# Patient Record
Sex: Female | Born: 1950 | Race: Black or African American | Hispanic: No | Marital: Single | Smoking: Never smoker
Health system: Southern US, Community
[De-identification: ages and names within clinical notes are randomized; demographics above are authoritative.]

## PROBLEM LIST (undated history)

## (undated) DIAGNOSIS — I10 Essential (primary) hypertension: Secondary | ICD-10-CM

## (undated) DIAGNOSIS — K5909 Other constipation: Secondary | ICD-10-CM

## (undated) DIAGNOSIS — E119 Type 2 diabetes mellitus without complications: Secondary | ICD-10-CM

## (undated) DIAGNOSIS — S7290XA Unspecified fracture of unspecified femur, initial encounter for closed fracture: Secondary | ICD-10-CM

## (undated) DIAGNOSIS — N3281 Overactive bladder: Secondary | ICD-10-CM

## (undated) DIAGNOSIS — R519 Headache, unspecified: Secondary | ICD-10-CM

## (undated) DIAGNOSIS — R55 Syncope and collapse: Secondary | ICD-10-CM

## (undated) DIAGNOSIS — I209 Angina pectoris, unspecified: Secondary | ICD-10-CM

## (undated) DIAGNOSIS — I2510 Atherosclerotic heart disease of native coronary artery without angina pectoris: Secondary | ICD-10-CM

## (undated) DIAGNOSIS — C8590 Non-Hodgkin lymphoma, unspecified, unspecified site: Secondary | ICD-10-CM

## (undated) DIAGNOSIS — M6530 Trigger finger, unspecified finger: Secondary | ICD-10-CM

## (undated) DIAGNOSIS — I442 Atrioventricular block, complete: Secondary | ICD-10-CM

## (undated) DIAGNOSIS — N951 Menopausal and female climacteric states: Secondary | ICD-10-CM

## (undated) DIAGNOSIS — M7710 Lateral epicondylitis, unspecified elbow: Secondary | ICD-10-CM

## (undated) DIAGNOSIS — R51 Headache: Secondary | ICD-10-CM

## (undated) DIAGNOSIS — Z79890 Hormone replacement therapy (postmenopausal): Secondary | ICD-10-CM

## (undated) DIAGNOSIS — I739 Peripheral vascular disease, unspecified: Secondary | ICD-10-CM

## (undated) DIAGNOSIS — D219 Benign neoplasm of connective and other soft tissue, unspecified: Secondary | ICD-10-CM

## (undated) DIAGNOSIS — M719 Bursopathy, unspecified: Secondary | ICD-10-CM

## (undated) HISTORY — PX: DILATION AND CURETTAGE OF UTERUS: SHX78

## (undated) HISTORY — DX: Atrioventricular block, complete: I44.2

## (undated) HISTORY — DX: Atherosclerotic heart disease of native coronary artery without angina pectoris: I25.10

## (undated) HISTORY — DX: Unspecified fracture of unspecified femur, initial encounter for closed fracture: S72.90XA

## (undated) HISTORY — DX: Overactive bladder: N32.81

## (undated) HISTORY — DX: Type 2 diabetes mellitus without complications: E11.9

## (undated) HISTORY — DX: Syncope and collapse: R55

## (undated) HISTORY — DX: Benign neoplasm of connective and other soft tissue, unspecified: D21.9

## (undated) HISTORY — DX: Headache, unspecified: R51.9

## (undated) HISTORY — DX: Headache: R51

## (undated) HISTORY — DX: Essential (primary) hypertension: I10

## (undated) HISTORY — DX: Trigger finger, unspecified finger: M65.30

## (undated) HISTORY — PX: PELVIC LAPAROSCOPY: SHX162

## (undated) HISTORY — DX: Hormone replacement therapy: Z79.890

## (undated) HISTORY — DX: Bursopathy, unspecified: M71.9

## (undated) HISTORY — PX: TOE SURGERY: SHX1073

## (undated) HISTORY — DX: Peripheral vascular disease, unspecified: I73.9

## (undated) HISTORY — DX: Other constipation: K59.09

## (undated) HISTORY — DX: Menopausal and female climacteric states: N95.1

## (undated) HISTORY — DX: Non-Hodgkin lymphoma, unspecified, unspecified site: C85.90

## (undated) HISTORY — DX: Lateral epicondylitis, unspecified elbow: M77.10

---

## 1990-03-05 DIAGNOSIS — C8590 Non-Hodgkin lymphoma, unspecified, unspecified site: Secondary | ICD-10-CM

## 1990-03-05 HISTORY — DX: Non-Hodgkin lymphoma, unspecified, unspecified site: C85.90

## 1990-03-05 HISTORY — PX: OTHER SURGICAL HISTORY: SHX169

## 2006-11-26 ENCOUNTER — Encounter: Admission: RE | Admit: 2006-11-26 | Discharge: 2006-11-26 | Payer: Self-pay | Admitting: Internal Medicine

## 2007-11-27 ENCOUNTER — Encounter: Admission: RE | Admit: 2007-11-27 | Discharge: 2007-11-27 | Payer: Self-pay | Admitting: Internal Medicine

## 2008-12-03 ENCOUNTER — Encounter: Admission: RE | Admit: 2008-12-03 | Discharge: 2008-12-03 | Payer: Self-pay | Admitting: Internal Medicine

## 2009-03-05 DIAGNOSIS — S7290XA Unspecified fracture of unspecified femur, initial encounter for closed fracture: Secondary | ICD-10-CM

## 2009-03-05 HISTORY — DX: Unspecified fracture of unspecified femur, initial encounter for closed fracture: S72.90XA

## 2009-05-13 ENCOUNTER — Encounter: Admission: RE | Admit: 2009-05-13 | Discharge: 2009-05-13 | Payer: Self-pay | Admitting: Internal Medicine

## 2009-06-03 ENCOUNTER — Encounter: Admission: RE | Admit: 2009-06-03 | Discharge: 2009-06-03 | Payer: Self-pay | Admitting: Orthopedic Surgery

## 2009-06-08 ENCOUNTER — Ambulatory Visit (HOSPITAL_COMMUNITY): Admission: RE | Admit: 2009-06-08 | Discharge: 2009-06-08 | Payer: Self-pay | Admitting: Orthopedic Surgery

## 2009-06-22 ENCOUNTER — Ambulatory Visit: Payer: Self-pay | Admitting: Oncology

## 2009-06-29 ENCOUNTER — Ambulatory Visit: Payer: Self-pay | Admitting: Obstetrics and Gynecology

## 2009-06-29 ENCOUNTER — Other Ambulatory Visit: Admission: RE | Admit: 2009-06-29 | Discharge: 2009-06-29 | Payer: Self-pay | Admitting: Obstetrics and Gynecology

## 2009-07-08 ENCOUNTER — Ambulatory Visit: Payer: Self-pay | Admitting: Obstetrics and Gynecology

## 2009-07-22 ENCOUNTER — Ambulatory Visit: Payer: Self-pay | Admitting: Oncology

## 2009-07-22 LAB — COMPREHENSIVE METABOLIC PANEL
Alkaline Phosphatase: 49 U/L (ref 39–117)
BUN: 18 mg/dL (ref 6–23)
Creatinine, Ser: 0.9 mg/dL (ref 0.40–1.20)
Glucose, Bld: 90 mg/dL (ref 70–99)
Sodium: 137 mEq/L (ref 135–145)
Total Bilirubin: 0.5 mg/dL (ref 0.3–1.2)

## 2009-07-22 LAB — CBC WITH DIFFERENTIAL/PLATELET
Eosinophils Absolute: 0.1 10*3/uL (ref 0.0–0.5)
LYMPH%: 52.6 % — ABNORMAL HIGH (ref 14.0–49.7)
MCV: 95.3 fL (ref 79.5–101.0)
MONO%: 13 % (ref 0.0–14.0)
NEUT#: 1.5 10*3/uL (ref 1.5–6.5)
Platelets: 163 10*3/uL (ref 145–400)
RBC: 3.95 10*6/uL (ref 3.70–5.45)

## 2009-07-22 LAB — SEDIMENTATION RATE: Sed Rate: 8 mm/hr (ref 0–22)

## 2009-09-22 ENCOUNTER — Encounter: Admission: RE | Admit: 2009-09-22 | Discharge: 2009-09-22 | Payer: Self-pay | Admitting: Orthopedic Surgery

## 2009-12-16 ENCOUNTER — Encounter: Admission: RE | Admit: 2009-12-16 | Discharge: 2009-12-16 | Payer: Self-pay | Admitting: Internal Medicine

## 2010-03-09 ENCOUNTER — Encounter: Admit: 2010-03-09 | Payer: Self-pay | Admitting: Internal Medicine

## 2010-05-09 ENCOUNTER — Ambulatory Visit: Payer: Self-pay | Admitting: *Deleted

## 2010-05-10 ENCOUNTER — Ambulatory Visit (INDEPENDENT_AMBULATORY_CARE_PROVIDER_SITE_OTHER): Payer: BC Managed Care – PPO | Admitting: Obstetrics and Gynecology

## 2010-05-10 DIAGNOSIS — N3941 Urge incontinence: Secondary | ICD-10-CM

## 2010-05-10 DIAGNOSIS — R350 Frequency of micturition: Secondary | ICD-10-CM

## 2010-05-10 DIAGNOSIS — N952 Postmenopausal atrophic vaginitis: Secondary | ICD-10-CM

## 2010-05-12 ENCOUNTER — Ambulatory Visit: Payer: Self-pay | Admitting: *Deleted

## 2010-05-12 ENCOUNTER — Encounter: Payer: BC Managed Care – PPO | Attending: Internal Medicine | Admitting: *Deleted

## 2010-05-12 DIAGNOSIS — R7309 Other abnormal glucose: Secondary | ICD-10-CM | POA: Insufficient documentation

## 2010-05-12 DIAGNOSIS — E7800 Pure hypercholesterolemia, unspecified: Secondary | ICD-10-CM | POA: Insufficient documentation

## 2010-05-12 DIAGNOSIS — Z713 Dietary counseling and surveillance: Secondary | ICD-10-CM | POA: Insufficient documentation

## 2010-06-18 ENCOUNTER — Emergency Department (HOSPITAL_COMMUNITY)
Admission: EM | Admit: 2010-06-18 | Discharge: 2010-06-18 | Disposition: A | Discharge status: Home / Self Care | Payer: BC Managed Care – PPO | Attending: Emergency Medicine | Admitting: Emergency Medicine

## 2010-06-18 DIAGNOSIS — Z760 Encounter for issue of repeat prescription: Secondary | ICD-10-CM | POA: Insufficient documentation

## 2010-06-18 DIAGNOSIS — Z79899 Other long term (current) drug therapy: Secondary | ICD-10-CM | POA: Insufficient documentation

## 2010-06-18 DIAGNOSIS — J3489 Other specified disorders of nose and nasal sinuses: Secondary | ICD-10-CM | POA: Insufficient documentation

## 2010-06-18 DIAGNOSIS — R110 Nausea: Secondary | ICD-10-CM | POA: Insufficient documentation

## 2010-06-18 DIAGNOSIS — H8110 Benign paroxysmal vertigo, unspecified ear: Secondary | ICD-10-CM | POA: Insufficient documentation

## 2010-06-18 LAB — BASIC METABOLIC PANEL
CO2: 26 mEq/L (ref 19–32)
GFR calc Af Amer: >60 mL/min (ref >60)
GFR calc non Af Amer: >60 mL/min (ref >60)
Glucose, Bld: 111 mg/dL — ABNORMAL HIGH (ref 70–99)
Potassium: 3.5 mEq/L (ref 3.5–5.1)
Sodium: 138 mEq/L (ref 135–145)

## 2010-06-18 LAB — CBC
HCT: 37.8 % (ref 36.0–46.0)
Hemoglobin: 13 g/dL (ref 12.0–15.0)
MCH: 31.7 pg (ref 26.0–34.0)
MCHC: 34.4 g/dL (ref 30.0–36.0)
MCV: 92.2 fL (ref 78.0–100.0)
Platelets: 167 K/uL (ref 150–400)
RBC: 4.1 MIL/uL (ref 3.87–5.11)
RDW: 12.8 % (ref 11.5–15.5)
WBC: 3.7 10*3/uL — ABNORMAL LOW (ref 4.0–10.5)

## 2010-06-18 LAB — BASIC METABOLIC PANEL WITH GFR
BUN: 12 mg/dL (ref 6–23)
Calcium: 10 mg/dL (ref 8.4–10.5)
Chloride: 105 meq/L (ref 96–112)
Creatinine, Ser: 0.8 mg/dL (ref 0.4–1.2)

## 2010-06-18 LAB — URINALYSIS, ROUTINE W REFLEX MICROSCOPIC
Bilirubin Urine: NEGATIVE
Glucose, UA: NEGATIVE mg/dL
Hgb urine dipstick: NEGATIVE
Ketones, ur: NEGATIVE mg/dL
Nitrite: NEGATIVE
Protein, ur: NEGATIVE mg/dL
Specific Gravity, Urine: 1.007 (ref 1.005–1.030)
Urobilinogen, UA: 0.2 mg/dL (ref 0.0–1.0)
pH: 7.5 (ref 5.0–8.0)

## 2010-06-18 LAB — DIFFERENTIAL
Basophils Absolute: 0 K/uL (ref 0.0–0.1)
Basophils Relative: 0 % (ref 0–1)
Eosinophils Absolute: 0 K/uL (ref 0.0–0.7)
Eosinophils Relative: 1 % (ref 0–5)
Lymphocytes Relative: 56 % — ABNORMAL HIGH (ref 12–46)
Lymphs Abs: 2.1 10*3/uL (ref 0.7–4.0)
Monocytes Absolute: 0.4 K/uL (ref 0.1–1.0)
Monocytes Relative: 10 % (ref 3–12)
Neutro Abs: 1.2 10*3/uL — ABNORMAL LOW (ref 1.7–7.7)
Neutrophils Relative %: 33 % — ABNORMAL LOW (ref 43–77)

## 2010-07-06 ENCOUNTER — Encounter (INDEPENDENT_AMBULATORY_CARE_PROVIDER_SITE_OTHER): Payer: BC Managed Care – PPO | Admitting: Obstetrics and Gynecology

## 2010-07-06 ENCOUNTER — Other Ambulatory Visit: Payer: Self-pay | Admitting: Obstetrics and Gynecology

## 2010-07-06 ENCOUNTER — Other Ambulatory Visit (HOSPITAL_COMMUNITY)
Admission: RE | Admit: 2010-07-06 | Discharge: 2010-07-06 | Disposition: A | Discharge status: Home / Self Care | Payer: BC Managed Care – PPO | Source: Ambulatory Visit | Attending: Obstetrics and Gynecology | Admitting: Obstetrics and Gynecology

## 2010-07-06 DIAGNOSIS — Z833 Family history of diabetes mellitus: Secondary | ICD-10-CM

## 2010-07-06 DIAGNOSIS — Z01419 Encounter for gynecological examination (general) (routine) without abnormal findings: Secondary | ICD-10-CM

## 2010-07-06 DIAGNOSIS — Z13220 Encounter for screening for lipoid disorders: Secondary | ICD-10-CM

## 2010-07-06 DIAGNOSIS — Z124 Encounter for screening for malignant neoplasm of cervix: Secondary | ICD-10-CM | POA: Insufficient documentation

## 2010-07-10 ENCOUNTER — Ambulatory Visit: Payer: BC Managed Care – PPO | Admitting: Obstetrics and Gynecology

## 2010-07-10 ENCOUNTER — Other Ambulatory Visit: Payer: BC Managed Care – PPO

## 2010-08-02 ENCOUNTER — Other Ambulatory Visit: Payer: BC Managed Care – PPO

## 2010-08-02 ENCOUNTER — Ambulatory Visit (INDEPENDENT_AMBULATORY_CARE_PROVIDER_SITE_OTHER): Payer: BC Managed Care – PPO | Admitting: Obstetrics and Gynecology

## 2010-08-02 DIAGNOSIS — N83339 Acquired atrophy of ovary and fallopian tube, unspecified side: Secondary | ICD-10-CM

## 2010-08-02 DIAGNOSIS — D252 Subserosal leiomyoma of uterus: Secondary | ICD-10-CM

## 2010-08-02 DIAGNOSIS — D259 Leiomyoma of uterus, unspecified: Secondary | ICD-10-CM

## 2010-08-02 DIAGNOSIS — D251 Intramural leiomyoma of uterus: Secondary | ICD-10-CM

## 2010-11-17 ENCOUNTER — Other Ambulatory Visit: Payer: Self-pay | Admitting: Internal Medicine

## 2010-11-17 DIAGNOSIS — Z1231 Encounter for screening mammogram for malignant neoplasm of breast: Secondary | ICD-10-CM

## 2010-12-22 ENCOUNTER — Ambulatory Visit
Admission: RE | Admit: 2010-12-22 | Discharge: 2010-12-22 | Disposition: A | Discharge status: Home / Self Care | Payer: BC Managed Care – PPO | Source: Ambulatory Visit | Attending: Internal Medicine | Admitting: Internal Medicine

## 2010-12-22 DIAGNOSIS — Z1231 Encounter for screening mammogram for malignant neoplasm of breast: Secondary | ICD-10-CM

## 2011-02-06 ENCOUNTER — Other Ambulatory Visit: Payer: Self-pay

## 2011-02-06 MED ORDER — CONJ ESTROG-MEDROXYPROGEST ACE 0.3-1.5 MG PO TABS: 1.0000 | ORAL_TABLET | Freq: Every day | ORAL | Status: DC

## 2011-03-05 ENCOUNTER — Other Ambulatory Visit: Payer: Self-pay

## 2011-03-05 MED ORDER — FESOTERODINE FUMARATE ER 4 MG PO TB24: 4.0000 mg | ORAL_TABLET | Freq: Every day | ORAL | Status: DC

## 2011-04-18 ENCOUNTER — Telehealth: Payer: Self-pay | Admitting: *Deleted

## 2011-04-18 MED ORDER — FESOTERODINE FUMARATE ER 8 MG PO TB24: 8.0000 mg | ORAL_TABLET | Freq: Every day | ORAL | Status: DC

## 2011-04-18 NOTE — Telephone Encounter (Signed)
She can increase to 8 mg daily. They make an 8 mg pill.

## 2011-04-18 NOTE — Telephone Encounter (Signed)
Pt informed with the below note, rx sent to pharmacy. 

## 2011-04-18 NOTE — Telephone Encounter (Signed)
Pt called stating her Toviaz 4 mg is no longer working, she is having trouble going to bathroom. Pt would like to increase to 8 mg daily? She has been taking two 4 mg tablets daily for about a week now and said it works fine. Please advise

## 2011-05-08 ENCOUNTER — Telehealth: Payer: Self-pay | Admitting: *Deleted

## 2011-05-08 MED ORDER — CONJ ESTROG-MEDROXYPROGEST ACE 0.3-1.5 MG PO TABS: 1.0000 | ORAL_TABLET | Freq: Every day | ORAL | Status: DC

## 2011-05-08 NOTE — Telephone Encounter (Signed)
Pt called stating she ran out of her Prempro 0.3-1.5 mg and requesting 1 month supply sent to [facility] pharmacy because pt [facility] to [facility]. rx sent.

## 2011-07-12 ENCOUNTER — Encounter: Payer: Self-pay | Admitting: Gynecology

## 2011-07-12 DIAGNOSIS — N3281 Overactive bladder: Secondary | ICD-10-CM | POA: Insufficient documentation

## 2011-07-12 DIAGNOSIS — D219 Benign neoplasm of connective and other soft tissue, unspecified: Secondary | ICD-10-CM | POA: Insufficient documentation

## 2011-07-12 DIAGNOSIS — N951 Menopausal and female climacteric states: Secondary | ICD-10-CM | POA: Insufficient documentation

## 2011-07-12 DIAGNOSIS — C8590 Non-Hodgkin lymphoma, unspecified, unspecified site: Secondary | ICD-10-CM | POA: Insufficient documentation

## 2011-07-19 ENCOUNTER — Encounter: Payer: Self-pay | Admitting: Obstetrics and Gynecology

## 2011-07-19 ENCOUNTER — Ambulatory Visit (INDEPENDENT_AMBULATORY_CARE_PROVIDER_SITE_OTHER): Payer: BC Managed Care – PPO | Admitting: Obstetrics and Gynecology

## 2011-07-19 VITALS — BP 110/70 | Ht 64.0 in | Wt 162.0 lb

## 2011-07-19 DIAGNOSIS — Z01419 Encounter for gynecological examination (general) (routine) without abnormal findings: Secondary | ICD-10-CM

## 2011-07-19 DIAGNOSIS — D259 Leiomyoma of uterus, unspecified: Secondary | ICD-10-CM

## 2011-07-19 DIAGNOSIS — D219 Benign neoplasm of connective and other soft tissue, unspecified: Secondary | ICD-10-CM

## 2011-07-19 MED ORDER — FESOTERODINE FUMARATE ER 8 MG PO TB24: 8.0000 mg | ORAL_TABLET | Freq: Every day | ORAL | Status: DC

## 2011-07-19 MED ORDER — CONJ ESTROG-MEDROXYPROGEST ACE 0.3-1.5 MG PO TABS: 1.0000 | ORAL_TABLET | Freq: Every day | ORAL | Status: DC

## 2011-07-19 NOTE — Patient Instructions (Signed)
Find out if cousin was tested for BRCA1 and BRCA2. Please let me know.

## 2011-07-19 NOTE — Progress Notes (Signed)
Patient came to see me today for her annual GYN exam. She remains on Prempro daily. She  Has tried to do it every other day but she has too many flashes. She is having no vaginal bleeding. She is having no pelvic pain. She is not sexually active. She does her lab through her PCP. She is on Forteo through Dr. Philipp Ovens office for osteoporosis. She will get a followup bone density this summer. She had a normal mammogram this year. She has a maternal cousin who had breast cancer at age 45. She does not know if she was tested for BRCA1 and BRCA2. 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  Physical examination: Kennon Portela present HEENT within normal limits. Neck: Thyroid not large. No masses. Supraclavicular nodes: not enlarged. Breasts: Examined in both sitting and lying  position. No skin changes and no masses. Abdomen: Soft no guarding rebound or masses or hernia. Pelvic: External: Within normal limits. BUS: Within normal limits. Vaginal:within normal limits. Good estrogen effect. No evidence of cystocele rectocele or enterocele. Cervix: clean. Uterus: 12 week fibroids that extend both posteriorly and to the right adnexa. Adnexa: No masses but impossible to evaluate right ovary. Rectovaginal exam: Confirmatory and negative. Extremities: Within normal limits.  Assessment: #1. Family history of early onset breast cancer #2. Detrussor instability #3. Fibroids #4. Menopausal symptoms #5. Osteoporosis  Plan: We discussed increased risk of breast cancer, DVT, and stroke with HRT. I offered her estrogen patch. She declined. She feels benefits outweigh the risks and will stay on Prempro. We refilled her Gala Murdoch  Also. Pelvic ultrasound scheduled. Patient to find out about cousins BRCA1 and BRCA2 status.

## 2011-07-20 LAB — URINALYSIS W MICROSCOPIC + REFLEX CULTURE
Bacteria, UA: NONE SEEN
Casts: NONE SEEN
Crystals: NONE SEEN
Ketones, ur: NEGATIVE mg/dL
Nitrite: NEGATIVE
Specific Gravity, Urine: 1.012 (ref 1.005–1.030)
Squamous Epithelial / LPF: NONE SEEN
pH: 6.5 (ref 5.0–8.0)

## 2011-07-27 ENCOUNTER — Ambulatory Visit: Payer: BC Managed Care – PPO | Admitting: Obstetrics and Gynecology

## 2011-07-27 ENCOUNTER — Other Ambulatory Visit: Payer: BC Managed Care – PPO

## 2011-08-02 ENCOUNTER — Ambulatory Visit (INDEPENDENT_AMBULATORY_CARE_PROVIDER_SITE_OTHER): Payer: BC Managed Care – PPO | Admitting: Obstetrics and Gynecology

## 2011-08-02 ENCOUNTER — Ambulatory Visit (INDEPENDENT_AMBULATORY_CARE_PROVIDER_SITE_OTHER): Payer: BC Managed Care – PPO

## 2011-08-02 DIAGNOSIS — D259 Leiomyoma of uterus, unspecified: Secondary | ICD-10-CM

## 2011-08-02 DIAGNOSIS — D252 Subserosal leiomyoma of uterus: Secondary | ICD-10-CM

## 2011-08-02 DIAGNOSIS — N854 Malposition of uterus: Secondary | ICD-10-CM

## 2011-08-02 DIAGNOSIS — D219 Benign neoplasm of connective and other soft tissue, unspecified: Secondary | ICD-10-CM

## 2011-08-02 DIAGNOSIS — D251 Intramural leiomyoma of uterus: Secondary | ICD-10-CM

## 2011-08-02 DIAGNOSIS — N83339 Acquired atrophy of ovary and fallopian tube, unspecified side: Secondary | ICD-10-CM

## 2011-08-02 DIAGNOSIS — R339 Retention of urine, unspecified: Secondary | ICD-10-CM

## 2011-08-02 NOTE — Progress Notes (Signed)
Patient came to see me today for ultrasound due to large fibroids making it impossible to evaluate her ovaries. On ultrasound her uterus is enlarged by multiple myomas. The large as is almost 6 cm.  she does have multiple fibroids. Size is compatible to her last ultrasound one year ago. Her endometrial echo is 2 mm. Her ovaries are normal. Her cul-de-sac is free of fluid. When she emptied her bladder she had significant urinary retention which I believe is due to her fibroids. It really isn't clinically issue with her as she does well with her Toviaz.  Assessment: #1. Stable fibroids with normal ovaries #2. Urinary retention-asymptomatic  Plan: Patient reassured. Discussed findings. Discussed hysterectomy but the patient really is not symptomatic enough to consider.

## 2011-08-06 ENCOUNTER — Other Ambulatory Visit: Payer: Self-pay | Admitting: *Deleted

## 2011-08-06 MED ORDER — FESOTERODINE FUMARATE ER 8 MG PO TB24: 8.0000 mg | ORAL_TABLET | Freq: Every day | ORAL | Status: DC

## 2011-08-06 NOTE — Progress Notes (Signed)
Patient requested 90 day rx be changed to local pharmacy 30 day supply due to cost

## 2011-10-26 ENCOUNTER — Telehealth: Payer: Self-pay | Admitting: *Deleted

## 2011-10-26 MED ORDER — CONJ ESTROG-MEDROXYPROGEST ACE 0.3-1.5 MG PO TABS: 1.0000 | ORAL_TABLET | Freq: Every day | ORAL | Status: DC

## 2011-10-26 NOTE — Telephone Encounter (Signed)
Pt left message and would like for her prempro 0.3-1.5 mg sent [facility]. rx will be sent.

## 2011-11-08 ENCOUNTER — Telehealth: Payer: Self-pay | Admitting: *Deleted

## 2011-11-08 MED ORDER — CONJ ESTROG-MEDROXYPROGEST ACE 0.45-1.5 MG PO TABS: 1.0000 | ORAL_TABLET | Freq: Every day | ORAL | Status: DC

## 2011-11-08 NOTE — Telephone Encounter (Signed)
Pt informed with the below note, rx sent. 

## 2011-11-08 NOTE — Addendum Note (Signed)
Addended by: Aura Camps on: 11/08/2011 02:41 PM   Modules accepted: Orders

## 2011-11-08 NOTE — Telephone Encounter (Signed)
Prempro 0.45 mg 1 daily. Make sure they give her Prempro and not Premarin.

## 2011-11-08 NOTE — Telephone Encounter (Signed)
Pt is currently taking prempro 0.3-1.5 mg daily, pt is calling requesting an increase to next dose. C/o increase hot flashes day and night continuously.pt is up to date on annual.  Please advise

## 2011-11-20 ENCOUNTER — Other Ambulatory Visit: Payer: Self-pay | Admitting: Family Medicine

## 2011-11-20 DIAGNOSIS — Z1231 Encounter for screening mammogram for malignant neoplasm of breast: Secondary | ICD-10-CM

## 2011-12-18 ENCOUNTER — Telehealth: Payer: Self-pay | Admitting: *Deleted

## 2011-12-18 NOTE — Telephone Encounter (Signed)
Pt called requesting name of counseler, gave pt Berniece Andreas # to call.

## 2011-12-25 ENCOUNTER — Ambulatory Visit
Admission: RE | Admit: 2011-12-25 | Discharge: 2011-12-25 | Disposition: A | Discharge status: Home / Self Care | Payer: BC Managed Care – PPO | Source: Ambulatory Visit | Attending: Family Medicine | Admitting: Family Medicine

## 2011-12-25 ENCOUNTER — Ambulatory Visit: Payer: BC Managed Care – PPO

## 2011-12-25 DIAGNOSIS — Z1231 Encounter for screening mammogram for malignant neoplasm of breast: Secondary | ICD-10-CM

## 2012-01-04 ENCOUNTER — Ambulatory Visit (INDEPENDENT_AMBULATORY_CARE_PROVIDER_SITE_OTHER): Payer: BC Managed Care – PPO | Admitting: Licensed Clinical Social Worker

## 2012-01-04 DIAGNOSIS — F411 Generalized anxiety disorder: Secondary | ICD-10-CM

## 2012-01-14 ENCOUNTER — Ambulatory Visit (INDEPENDENT_AMBULATORY_CARE_PROVIDER_SITE_OTHER): Payer: BC Managed Care – PPO | Admitting: Licensed Clinical Social Worker

## 2012-01-14 DIAGNOSIS — F411 Generalized anxiety disorder: Secondary | ICD-10-CM

## 2012-01-21 ENCOUNTER — Ambulatory Visit (INDEPENDENT_AMBULATORY_CARE_PROVIDER_SITE_OTHER): Payer: BC Managed Care – PPO | Admitting: Licensed Clinical Social Worker

## 2012-01-21 DIAGNOSIS — F411 Generalized anxiety disorder: Secondary | ICD-10-CM

## 2012-02-08 ENCOUNTER — Ambulatory Visit (INDEPENDENT_AMBULATORY_CARE_PROVIDER_SITE_OTHER): Payer: BC Managed Care – PPO | Admitting: Licensed Clinical Social Worker

## 2012-02-08 DIAGNOSIS — F411 Generalized anxiety disorder: Secondary | ICD-10-CM

## 2012-02-18 ENCOUNTER — Ambulatory Visit (INDEPENDENT_AMBULATORY_CARE_PROVIDER_SITE_OTHER): Payer: BC Managed Care – PPO | Admitting: Obstetrics and Gynecology

## 2012-02-18 DIAGNOSIS — N951 Menopausal and female climacteric states: Secondary | ICD-10-CM

## 2012-02-18 DIAGNOSIS — R232 Flushing: Secondary | ICD-10-CM

## 2012-02-18 DIAGNOSIS — G43909 Migraine, unspecified, not intractable, without status migrainosus: Secondary | ICD-10-CM

## 2012-02-18 MED ORDER — PAROXETINE MESYLATE 7.5 MG PO CAPS: 7.5000 mg | ORAL_CAPSULE | Freq: Every day | ORAL | Status: DC

## 2012-02-18 NOTE — Progress Notes (Signed)
Patient came back today to discuss her HRT. Evidently in the 90s when she became menopausal and was having hot flashes she was started on HRT. At about the same time she started to have trouble with vertigo. She has had a full workup for vertigo first in [place] and now  here.  Her ENT doctor thinks that vertigo is really a migraine equivalent. Patient was interested in stopping the HRT and trying an SSRI.  We had a long discussion about the above. She will stop her HRT. She will initiate Brisdelle  7.5mg  daily at hs. She will let us know how she does. If need be she will restart the HRT with the Brisdelle.Consider estrogen-serm when available next year depending how she responds to the above.

## 2012-02-18 NOTE — Patient Instructions (Addendum)
Stop HRT and start Brisdelle.

## 2012-02-22 ENCOUNTER — Ambulatory Visit: Payer: BC Managed Care – PPO | Admitting: Licensed Clinical Social Worker

## 2012-03-12 ENCOUNTER — Ambulatory Visit (INDEPENDENT_AMBULATORY_CARE_PROVIDER_SITE_OTHER): Payer: BC Managed Care – PPO | Admitting: Licensed Clinical Social Worker

## 2012-03-12 DIAGNOSIS — F411 Generalized anxiety disorder: Secondary | ICD-10-CM

## 2012-03-26 ENCOUNTER — Ambulatory Visit: Payer: BC Managed Care – PPO | Admitting: Licensed Clinical Social Worker

## 2012-04-18 ENCOUNTER — Ambulatory Visit (INDEPENDENT_AMBULATORY_CARE_PROVIDER_SITE_OTHER): Payer: BC Managed Care – PPO | Admitting: Licensed Clinical Social Worker

## 2012-04-18 DIAGNOSIS — F411 Generalized anxiety disorder: Secondary | ICD-10-CM

## 2012-05-02 ENCOUNTER — Ambulatory Visit: Payer: BC Managed Care – PPO | Admitting: Licensed Clinical Social Worker

## 2012-05-09 ENCOUNTER — Ambulatory Visit: Payer: BC Managed Care – PPO | Admitting: Licensed Clinical Social Worker

## 2012-05-23 ENCOUNTER — Ambulatory Visit (INDEPENDENT_AMBULATORY_CARE_PROVIDER_SITE_OTHER): Payer: BC Managed Care – PPO | Admitting: Licensed Clinical Social Worker

## 2012-05-23 DIAGNOSIS — F411 Generalized anxiety disorder: Secondary | ICD-10-CM

## 2012-06-13 ENCOUNTER — Ambulatory Visit (INDEPENDENT_AMBULATORY_CARE_PROVIDER_SITE_OTHER): Payer: BC Managed Care – PPO | Admitting: Licensed Clinical Social Worker

## 2012-06-13 DIAGNOSIS — F411 Generalized anxiety disorder: Secondary | ICD-10-CM

## 2012-07-18 ENCOUNTER — Ambulatory Visit: Payer: BC Managed Care – PPO | Admitting: Licensed Clinical Social Worker

## 2012-10-10 ENCOUNTER — Encounter: Payer: Self-pay | Admitting: Obstetrics & Gynecology

## 2012-11-13 ENCOUNTER — Encounter: Payer: Self-pay | Admitting: Obstetrics & Gynecology

## 2012-11-26 ENCOUNTER — Other Ambulatory Visit: Payer: Self-pay | Admitting: Obstetrics and Gynecology

## 2012-11-27 ENCOUNTER — Telehealth: Payer: Self-pay | Admitting: *Deleted

## 2012-11-27 MED ORDER — CONJ ESTROG-MEDROXYPROGEST ACE 0.45-1.5 MG PO TABS: 1.0000 | ORAL_TABLET | Freq: Every day | ORAL | Status: DC

## 2012-11-27 NOTE — Telephone Encounter (Signed)
Pt called requesting refill on Prempro, annual with nancy scheduled on 12/11/12. 30 days supply will be sent 0 refills. Pt said she is going to cancel appointment with Dr.Miller GYN, and see nancy young.

## 2012-12-01 ENCOUNTER — Encounter: Payer: Self-pay | Admitting: Obstetrics & Gynecology

## 2012-12-01 ENCOUNTER — Ambulatory Visit (INDEPENDENT_AMBULATORY_CARE_PROVIDER_SITE_OTHER): Payer: BC Managed Care – PPO | Admitting: Obstetrics & Gynecology

## 2012-12-01 VITALS — BP 130/60 | HR 60 | Resp 16 | Ht 64.5 in | Wt 166.4 lb

## 2012-12-01 DIAGNOSIS — Z124 Encounter for screening for malignant neoplasm of cervix: Secondary | ICD-10-CM

## 2012-12-01 DIAGNOSIS — Z01419 Encounter for gynecological examination (general) (routine) without abnormal findings: Secondary | ICD-10-CM

## 2012-12-01 MED ORDER — VENLAFAXINE HCL ER 37.5 MG PO CP24: 37.5000 mg | ORAL_CAPSULE | Freq: Every day | ORAL | Status: DC

## 2012-12-01 MED ORDER — VALACYCLOVIR HCL 500 MG PO TABS: 500.0000 mg | ORAL_TABLET | Freq: Every day | ORAL | Status: DC

## 2012-12-01 NOTE — Progress Notes (Signed)
62 y.o. G0P0 SingleAfrican AmericanF here for annual exam.  Moved to [facility] 2008.  Started seeing Dr. Eda Paschal about three years.  He retired and she has transferred to [place].  Went through menopause after CHOP chemotherapy with non-Hodgkin's lymphoma.  Had extra-nodal disease.  Had vasomotor after chemotherapy.  Oncologist started HRT.  Hot flashes are biggest issue for patient.  Also having vaginal dryness.  Stress plays a roll for her.  During the night she still has two or three and 8 or 9 a day.  She has sweats with this.    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    Dating and worried about how much dryness she may have with intercourse.  Partner also has some concerns about her HSV hx.  Wants to discuss suppression.  Has had a few episodes of fecal incontinence when she is taking stool softeners.  This is a problem if stool is too loose.    Patient's last menstrual period was 11/04/1990.          Sexually active: no  The current method of family planning is post menopausal status.    Exercising: yes  walking 4 x weekly, running 3 x weekly Smoker:  no  Health Maintenance: Pap:  4/12 History of abnormal Pap:  no MMG:  12/25/11 normal Colonoscopy:  12/11 per patient, [facility] BMD:   9/13 osteopenia, was on forteo x 2 years, repeat due next year TDaP:  2012 ?  Screening Labs: PCP, Hb today: PCP, Urine today: not done, done with PCP   reports that she has never smoked. She has never used smokeless tobacco. She reports that  drinks alcohol. She reports that she does not use illicit drugs.  Past Medical History  Diagnosis Date  . DI (detrusor instability)   . Fibroid   . Menopausal symptoms   . Non-Hodgkin lymphoma   . Osteoporosis   . Diabetes     pre-diabetes, controlling with diet and exercise    Past Surgical History  Procedure Laterality Date  . Pelvic laparoscopy    . Toe surgery    . Biopsy for non hodgkins lymphoma    . Dilation and curettage of uterus       Current Outpatient Prescriptions  Medication Sig Dispense Refill  . Calcium Carbonate-Vitamin D (CALCIUM + D PO) Take by mouth.      . Cetirizine HCl (ZYRTEC ALLERGY PO) Take by mouth.      . Cholecalciferol (VITAMIN D PO) Take by mouth.      . estrogen, conjugated,-medroxyprogesterone (PREMPRO) 0.45-1.5 MG per tablet Take 1 tablet by mouth daily.  30 tablet  0  . fluticasone (FLONASE) 50 MCG/ACT nasal spray       . meclizine (ANTIVERT) 25 MG tablet 25 mg.      . Multiple Vitamin (MULTIVITAMIN) tablet Take 1 tablet by mouth daily.      . fesoterodine (TOVIAZ) 8 MG TB24 Take 1 tablet (8 mg total) by mouth daily.  30 tablet  11  . PARoxetine Mesylate (BRISDELLE) 7.5 MG CAPS Take 7.5 mg by mouth daily at 8 pm.  30 capsule  5  . Teriparatide, Recombinant, (FORTEO) 600 MCG/2.4ML SOLN Inject into the skin.       No current facility-administered medications for this visit.    Family History  Problem Relation Age of Onset  . Diabetes Mother 24  . Hypertension Mother   . Alzheimer's disease Mother   . Hypertension Father   .  Diabetes Brother   . Hypertension Brother   . Colon cancer Maternal Uncle   . Heart disease Maternal Grandmother   . Breast cancer Cousin     Mat. 1st cousin-Age 27  . Breast cancer Maternal Aunt     Age 55's  . Alcoholism Father     deceased age 57    ROS:  Pertinent items are noted in HPI.  Otherwise, a comprehensive ROS was negative.  Exam:   BP 130/60  Pulse 60  Resp 16  Ht 5' 4.5" (1.638 m)  Wt 166 lb 6.4 oz (75.479 kg)  BMI 28.13 kg/m2  LMP 11/04/1990   Height: 5' 4.5" (163.8 cm)  Ht Readings from Last 3 Encounters:  12/01/12 5' 4.5" (1.638 m)  07/19/11 5\' 4"  (1.626 m)    General appearance: alert, cooperative and appears stated age Head: Normocephalic, without obvious abnormality, atraumatic Neck: no adenopathy, supple, symmetrical, trachea midline and thyroid normal to inspection and palpation Lungs: clear to auscultation  bilaterally Breasts: normal appearance, no masses or tenderness Heart: regular rate and rhythm Abdomen: soft, non-tender; bowel sounds normal; no masses,  no organomegaly Extremities: extremities normal, atraumatic, no cyanosis or edema Skin: Skin color, texture, turgor normal. No rashes or lesions Lymph nodes: Cervical, supraclavicular, and axillary nodes normal. No abnormal inguinal nodes palpated Neurologic: Grossly normal   Pelvic: External genitalia:  no lesions              Urethra:  normal appearing urethra with no masses, tenderness or lesions              Bartholins and Skenes: normal                 Vagina: normal appearing vagina with normal color and discharge, no lesions              Cervix: no lesions              Pap taken: yes Bimanual Exam:  Uterus:  retroverted, hard, mobile and 10-12 weeks and full in pelvis              Adnexa: normal adnexa and no mass, fullness, tenderness               Rectovaginal: Confirms               Anus:  normal sphincter tone, no lesions  A:  Well Woman with normal exam PMP on HRT due to vasomotor symptoms HSV hx with 1 outbreak per year Large fibroid uterus with calcified fibroids (compared to prior ultrasound and physical exam is similar to measurements) Osteopenia, on Forteo x 1 yr  P:   Mammogram due 10/14 pap smear with HR HPV today Will start suppression.  Valtrex 500mg  daily, increase to BID x 3 days prn outbreaks Prem/pro .45/1.5mg  daily.  No rx needed today. Trial of Venlafaxine 37.5mg  XR daily.  Increase bid after 7 days. Release for colonoscopy signed. return annually or prn  An After Visit Summary was printed and given to the patient.

## 2012-12-03 ENCOUNTER — Encounter: Payer: Self-pay | Admitting: Obstetrics & Gynecology

## 2012-12-08 ENCOUNTER — Telehealth: Payer: Self-pay | Admitting: Obstetrics & Gynecology

## 2012-12-08 NOTE — Telephone Encounter (Signed)
LMTCB  aa 

## 2012-12-08 NOTE — Telephone Encounter (Signed)
Patient calling with questions about a medication she is taking and also to give an update to nurse.

## 2012-12-11 ENCOUNTER — Encounter: Payer: Self-pay | Admitting: Women's Health

## 2012-12-15 ENCOUNTER — Other Ambulatory Visit: Payer: Self-pay | Admitting: Orthopedic Surgery

## 2012-12-15 NOTE — Telephone Encounter (Signed)
Spoke with pt about Effexor Rx. Pt has been taking 1 a day, 37.5 mg, and is ready to start taking 2 a day, but she needs a refill. Pt has 4-5 pills left.  [facility] Pt has 2 refills remaining on Rx for 75 mg. Advised pharmacist that pt is ready to pick up a refill.

## 2012-12-15 NOTE — Telephone Encounter (Addendum)
Spoke with pt to let her know a refill of effexor 75 mg will be ready at the pharmacy for her when she wants to pick it up. Pt reports the hot flashes are improving. She still has 2-3 a night, but the ones during the day are diminishing and are not as bad as they were. Advised she has one more refill left after this one, and I will ask Dr. Hyacinth Meeker if she needs to call back with update or if she needs OV in about 2 months. Pt appreciative.

## 2012-12-19 NOTE — Telephone Encounter (Signed)
Fine for pt to just give update.  Please do RF for 1 year for pt.

## 2012-12-22 ENCOUNTER — Other Ambulatory Visit: Payer: Self-pay | Admitting: Orthopedic Surgery

## 2012-12-22 MED ORDER — VENLAFAXINE HCL 75 MG PO TABS: 75.0000 mg | ORAL_TABLET | Freq: Every day | ORAL | Status: DC

## 2012-12-22 NOTE — Telephone Encounter (Signed)
Spoke with pt to advise her effexor 75 mg Rx was refilled until next year for Aex. Pt to call back in a couple months for an update on how she is doing.

## 2012-12-22 NOTE — Telephone Encounter (Signed)
Do you want pt to continue with the 37.5 mg caps twice daily, or should I order 75 mg tabs for her to take once daily?

## 2012-12-22 NOTE — Telephone Encounter (Signed)
75 mg of XL/XR medication is easiest.  RF for 1 year.

## 2012-12-25 ENCOUNTER — Other Ambulatory Visit: Payer: Self-pay | Admitting: Women's Health

## 2012-12-26 ENCOUNTER — Other Ambulatory Visit: Payer: Self-pay | Admitting: Obstetrics & Gynecology

## 2012-12-26 MED ORDER — CONJ ESTROG-MEDROXYPROGEST ACE 0.45-1.5 MG PO TABS: 1.0000 | ORAL_TABLET | Freq: Every day | ORAL | Status: DC

## 2012-12-26 NOTE — Telephone Encounter (Signed)
AEX was 12/01/12 no rx was needed at that time per AEX note.    Okay to refill?

## 2012-12-26 NOTE — Telephone Encounter (Signed)
Pt is requesting refills for Prempro 0.45 [facility] [place]

## 2012-12-26 NOTE — Telephone Encounter (Signed)
Patient notified that rx has been sent. 

## 2012-12-29 ENCOUNTER — Other Ambulatory Visit: Payer: Self-pay | Admitting: Obstetrics & Gynecology

## 2012-12-31 ENCOUNTER — Telehealth: Payer: Self-pay | Admitting: Obstetrics & Gynecology

## 2012-12-31 NOTE — Telephone Encounter (Signed)
Return call to patient.  She started on Effexor and acyclovir on 12-01-12. She was instructed to increase Effexor to 75 mg on 12-08-12 Complains of increasingly difficult to fall asleep and stay asleep.  Has gotten worse over last several nights despite "warm milk" which is all she has ever needed. Last night woke up at 1230 and only 2 hours after this. Prev night, slept about 4 hours. Was on Zyrtec when first started these medications but no longer on this. Since partner is long distance, she does not feel like Acyclovir is needed. Should she stop both of these?  Does not feel Effexor is having its intended purpose.  No change in amount of hot flashes. Still 3-4 per night, slightly better during day. Instructed not to make any changes till MD reviews.  Patient request we leave message if not available.

## 2012-12-31 NOTE — Telephone Encounter (Signed)
Patient calling re: Effexor and acylovir seem to be causing her trouble sleeping. Please advise?

## 2013-01-01 NOTE — Telephone Encounter (Signed)
Patient notified of dr Rondel Baton advice.  Ok to stop Effexor but recommends continuing the Acyclovir since she has had some outbreaks.  Patient has just refilled Effexor and is interested in going back to the 37.5 mg dose so can she take 1/2 tab? Advised I have to confirm that this med is okay to cut. Also mentioned possibility of other causes of insomnia.      [facility] [place] [facility] [phone] and confirmed this can be cut in half. LM on patient's VM that pharm confirmed splitting pill is ok.   Just to update you on patient's request to decrease dose. Is this agreeable?

## 2013-01-01 NOTE — Telephone Encounter (Signed)
Prior note shows that the effexor was helping but if she feels not, then stop this one.  I would, personally, stay on the Effexor.  Just a reminder there are other causes of insomnia.

## 2013-01-02 NOTE — Telephone Encounter (Signed)
Yes! Absolutely!

## 2013-01-13 ENCOUNTER — Other Ambulatory Visit: Payer: Self-pay

## 2013-01-13 DIAGNOSIS — Z1231 Encounter for screening mammogram for malignant neoplasm of breast: Secondary | ICD-10-CM

## 2013-01-16 ENCOUNTER — Encounter: Payer: Self-pay | Admitting: Obstetrics & Gynecology

## 2013-02-02 ENCOUNTER — Encounter: Payer: Self-pay | Admitting: Podiatry

## 2013-02-02 ENCOUNTER — Ambulatory Visit (INDEPENDENT_AMBULATORY_CARE_PROVIDER_SITE_OTHER): Payer: BC Managed Care – PPO | Admitting: Podiatry

## 2013-02-02 VITALS — BP 104/65 | HR 80 | Resp 12 | Ht 64.0 in | Wt 162.0 lb

## 2013-02-02 DIAGNOSIS — Q828 Other specified congenital malformations of skin: Secondary | ICD-10-CM

## 2013-02-02 DIAGNOSIS — M779 Enthesopathy, unspecified: Secondary | ICD-10-CM

## 2013-02-02 MED ORDER — TRIAMCINOLONE ACETONIDE 10 MG/ML IJ SUSP
10.0000 mg | Freq: Once | INTRAMUSCULAR | Status: AC
  Administered 2013-02-02: 10 mg

## 2013-02-04 NOTE — Progress Notes (Signed)
Subjective:     Patient ID: Deborah Fisher, female   DOB: 12-21-1950, 62 y.o.   MRN: [id number]  HPI patient complains of pain and inflammation around fifth metatarsal head right and keratotic lesion formation which has become very tender and impossible for her to cut   Review of Systems     Objective:   Physical Exam    neurovascular status intact with no health history changes noted. Patient is found to have inflammation with fluid around the head of fifth metatarsal right with keratotic lesion formation Assessment:     Capsulitis right fifth MPJ with fluid buildup in lesion formation surrounding the area    Plan:     H&P performed and today I injected the right fifth MPJ to milligrams Kenalog 5 mg Xylocaine Marcaine mixture and after appropriate numbness debridement of lesions fully with no iatrogenic bleeding noted. Reappoint when symptomatic

## 2013-02-12 ENCOUNTER — Ambulatory Visit
Admission: RE | Admit: 2013-02-12 | Discharge: 2013-02-12 | Disposition: A | Discharge status: Home / Self Care | Payer: BC Managed Care – PPO | Source: Ambulatory Visit

## 2013-02-12 DIAGNOSIS — Z1231 Encounter for screening mammogram for malignant neoplasm of breast: Secondary | ICD-10-CM

## 2013-04-03 ENCOUNTER — Other Ambulatory Visit: Payer: Self-pay | Admitting: Internal Medicine

## 2013-04-03 DIAGNOSIS — R2681 Unsteadiness on feet: Secondary | ICD-10-CM

## 2013-04-11 ENCOUNTER — Ambulatory Visit
Admission: RE | Admit: 2013-04-11 | Discharge: 2013-04-11 | Disposition: A | Discharge status: Home / Self Care | Payer: BC Managed Care – PPO | Source: Ambulatory Visit | Attending: Internal Medicine | Admitting: Internal Medicine

## 2013-04-11 DIAGNOSIS — R2681 Unsteadiness on feet: Secondary | ICD-10-CM

## 2013-10-08 ENCOUNTER — Encounter: Payer: Self-pay | Admitting: Podiatry

## 2013-10-08 ENCOUNTER — Ambulatory Visit (INDEPENDENT_AMBULATORY_CARE_PROVIDER_SITE_OTHER): Payer: BC Managed Care – PPO | Admitting: Podiatry

## 2013-10-08 DIAGNOSIS — Q828 Other specified congenital malformations of skin: Secondary | ICD-10-CM

## 2013-10-08 DIAGNOSIS — M779 Enthesopathy, unspecified: Secondary | ICD-10-CM

## 2013-10-08 MED ORDER — TRIAMCINOLONE ACETONIDE 10 MG/ML IJ SUSP
10.0000 mg | Freq: Once | INTRAMUSCULAR | Status: AC
  Administered 2013-10-08: 10 mg

## 2013-10-08 NOTE — Progress Notes (Signed)
Subjective:     Patient ID: Deborah Fisher, female   DOB: October 18, 1950, 63 y.o.   MRN: [id number]  HPI patient presents with painful fluid buildup fifth metatarsal right with lesion formation that makes it hard for her to wear shoe gear   Review of Systems     Objective:   Physical Exam Neurovascular status intact with inflammation and pain around fifth metatarsal head right with keratotic lesion formation    Assessment:     Capsulitis fifth MPJ right along with keratotic lesion formation    Plan:     Injected the fifth MPJ 3 mg Kenalog 5 mg I can Marcaine mixture and then did  deep debridement of lesion

## 2013-10-12 ENCOUNTER — Ambulatory Visit: Payer: BC Managed Care – PPO

## 2013-12-07 ENCOUNTER — Telehealth: Payer: Self-pay

## 2013-12-07 MED ORDER — VALACYCLOVIR HCL 500 MG PO TABS: 500.0000 mg | ORAL_TABLET | Freq: Every day | ORAL | Status: DC

## 2013-12-07 NOTE — Telephone Encounter (Signed)
Last AEX: 12/01/12 Last refill:12/01/12 #30 X 12 Current AEX: 01/01/14  Gave 1 mth supply to get pt thourgh until AEX

## 2013-12-17 ENCOUNTER — Telehealth: Payer: Self-pay | Admitting: Obstetrics & Gynecology

## 2013-12-17 ENCOUNTER — Ambulatory Visit: Payer: BC Managed Care – PPO | Admitting: Obstetrics & Gynecology

## 2013-12-18 NOTE — Telephone Encounter (Signed)
Spoke with patient. She is r/s for annual exam with Dr. Sabra Heck for 12/22/13 at 1245. She is agreeable to this. She is very interested in stopping Effexor and has four tablets left.  Patient states she is gaining weight and feels Effexor is not helping with hot flashes.That will last her until annual exam and can discuss weaning off with Dr Sabra Heck at that time.  Routing to provider for final review. Patient agreeable to disposition. Will close encounter

## 2013-12-18 NOTE — Telephone Encounter (Signed)
Message left to return call to Oluwaseun Bruyere at [phone].    

## 2013-12-18 NOTE — Telephone Encounter (Signed)
Patient would like to stop taking Effexor. Patient's aex was dr cx 01/01/14, I offered the next available 05/17/13 with Dr.Miller. Patient said " That is not acceptable, this is not fair to her. I want to stop taking Effexor and I need a pap smear. I offered patient to see another provider to keep her on schedule. Patient said "No".

## 2013-12-22 ENCOUNTER — Encounter: Payer: Self-pay | Admitting: Obstetrics & Gynecology

## 2013-12-22 ENCOUNTER — Ambulatory Visit (INDEPENDENT_AMBULATORY_CARE_PROVIDER_SITE_OTHER): Payer: BC Managed Care – PPO | Admitting: Obstetrics & Gynecology

## 2013-12-22 VITALS — BP 118/68 | HR 60 | Resp 12 | Ht 64.5 in | Wt 173.8 lb

## 2013-12-22 DIAGNOSIS — Z01419 Encounter for gynecological examination (general) (routine) without abnormal findings: Secondary | ICD-10-CM

## 2013-12-22 MED ORDER — VALACYCLOVIR HCL 500 MG PO TABS: 500.0000 mg | ORAL_TABLET | Freq: Every day | ORAL | Status: DC

## 2013-12-22 MED ORDER — CONJ ESTROG-MEDROXYPROGEST ACE 0.45-1.5 MG PO TABS: 1.0000 | ORAL_TABLET | Freq: Every day | ORAL | Status: DC

## 2013-12-22 MED ORDER — VENLAFAXINE HCL 37.5 MG PO TABS: ORAL_TABLET | ORAL | Status: DC

## 2013-12-22 NOTE — Progress Notes (Addendum)
63 y.o. G0P0 SingleAfrican AmericanF here for annual exam.  Doing well except feels like she has gained weight on the Effexor.  She also reports this didn't really help her.  She would like to stop this medication. She does not want to increase HRT dosage.  Discussed with pt other alternatives including gabapentin and clonidine.     Valtrex as worked very well for pt.  No outbreaks this year.     Patient's last menstrual period was 11/04/1990.          Sexually active: No.  The current method of family planning is post menopausal status.    Exercising: Yes.    walking Smoker:  no  Health Maintenance: Pap:  12/01/12 WNL/negative HR HPV History of abnormal Pap:  no MMG:  02/12/13-normal Colonoscopy:  2011-repeat in 10 years.  (12/31/13-outside records from Dr. Amedeo Plenty came.  F/u 10 years.  Hemoccult in 5 years) BMD:   2014-better, was on forteo x 2 years in the past TDaP:  ? 2012-will check with PCP Screening Labs: PCP, Hb today: PCP, Urine today: PCP   reports that she has never smoked. She has never used smokeless tobacco. She reports that she does not drink alcohol or use illicit drugs.  Past Medical History  Diagnosis Date  . DI (detrusor instability)   . Fibroid   . Menopausal symptoms   . Non-Hodgkin lymphoma 1992    treated with CHOP  . Osteoporosis     h/o 3 stress fractures  . Diabetes     pre-diabetes, controlling with diet and exercise    Past Surgical History  Procedure Laterality Date  . Pelvic laparoscopy    . Toe surgery    . Biopsy for non hodgkins lymphoma  1992  . Dilation and curettage of uterus      Current Outpatient Prescriptions  Medication Sig Dispense Refill  . Calcium Carbonate-Vitamin D (CALCIUM + D PO) Take by mouth.      . Cetirizine HCl (ZYRTEC ALLERGY PO) Take by mouth.      . Cholecalciferol (VITAMIN D PO) Take by mouth.      . estrogen, conjugated,-medroxyprogesterone (PREMPRO) 0.45-1.5 MG per tablet Take 1 tablet by mouth daily.  30 tablet   12  . fluticasone (FLONASE) 50 MCG/ACT nasal spray       . meclizine (ANTIVERT) 25 MG tablet 25 mg.      . Multiple Vitamin (MULTIVITAMIN) tablet Take 1 tablet by mouth daily.      Marland Kitchen PARoxetine Mesylate (BRISDELLE) 7.5 MG CAPS Take 7.5 mg by mouth daily at 8 pm.  30 capsule  5  . Teriparatide, Recombinant, (FORTEO) 600 MCG/2.4ML SOLN Inject into the skin.      . valACYclovir (VALTREX) 500 MG tablet Take 1 tablet (500 mg total) by mouth daily. With symptoms, increase dosage to twice daily for 3 days.  Then return to daily.  30 tablet  0  . venlafaxine (EFFEXOR) 75 MG tablet Take 1 tablet (75 mg total) by mouth daily.  30 tablet  11   No current facility-administered medications for this visit.    Family History  Problem Relation Age of Onset  . Diabetes Mother 89  . Hypertension Mother   . Alzheimer's disease Mother   . Hypertension Father   . Diabetes Brother   . Hypertension Brother   . Colon cancer Maternal Uncle   . Heart disease Maternal Grandmother   . Breast cancer Cousin     Mat. 1st cousin-Age 54  .  Breast cancer Maternal Aunt     Age 20's  . Alcoholism Father     deceased age 2    ROS:  Pertinent items are noted in HPI.  Otherwise, a comprehensive ROS was negative.  Exam:   BP 118/68  Pulse 60  Resp 12  Ht 5' 4.5" (1.638 m)  Wt 173 lb 12.8 oz (78.835 kg)  BMI 29.38 kg/m2  LMP 11/04/1990  Weight change:  +7#    Height: 5' 4.5" (163.8 cm)  Ht Readings from Last 3 Encounters:  12/22/13 5' 4.5" (1.638 m)  02/02/13 5\' 4"  (1.626 m)  12/01/12 5' 4.5" (1.638 m)    General appearance: alert, cooperative and appears stated age Head: Normocephalic, without obvious abnormality, atraumatic Neck: no adenopathy, supple, symmetrical, trachea midline and thyroid normal to inspection and palpation Lungs: clear to auscultation bilaterally Breasts: normal appearance, no masses or tenderness Heart: regular rate and rhythm Abdomen: soft, non-tender; bowel sounds normal; no  masses,  no organomegaly Extremities: extremities normal, atraumatic, no cyanosis or edema Skin: Skin color, texture, turgor normal. No rashes or lesions Lymph nodes: Cervical, supraclavicular, and axillary nodes normal. No abnormal inguinal nodes palpated Neurologic: Grossly normal   Pelvic: External genitalia:  no lesions              Urethra:  normal appearing urethra with no masses, tenderness or lesions              Bartholins and Skenes: normal                 Vagina: normal appearing vagina with normal color and discharge, no lesions              Cervix: no lesions              Pap taken: Yes.   Bimanual Exam:  Uterus:  Retroverted, hard, mobile, 10-12 weeks.  No change in size              Adnexa: normal adnexa and no mass, fullness, tenderness               Rectovaginal: Confirms               Anus:  normal sphincter tone, no lesions  A:  Well Woman with normal exam  PMP on HRT due to vasomotor symptoms  HSV history Large fibroid uterus with calcified fibroids (compared to prior ultrasound and physical exam is similar to measurements)  Osteopenia, on Forteo x 2 yr   P: Mammogram yearly pap smear with HR HPV today 2013.  No pap today Valtrex 500mg  daily, increase to BID x 3 days prn outbreaks.  Rx to pharmacy.   Prem/pro .45/1.5mg  daily.  Rx to pharmayc  Will wean off Effexor 37.5mg  XR daily for two weeks, then every other day for two weeks then off return annually or prn  An After Visit Summary was printed and given to the patient.

## 2014-01-01 ENCOUNTER — Ambulatory Visit: Payer: BC Managed Care – PPO | Admitting: Obstetrics & Gynecology

## 2014-01-14 ENCOUNTER — Telehealth: Payer: Self-pay | Admitting: Obstetrics & Gynecology

## 2014-01-14 NOTE — Telephone Encounter (Signed)
Patient states the pharmacy on file does not have her RX for: valACYclovir (VALTREX) 500 MG tablet  Take 1 tablet (500 mg total) by mouth daily. With symptoms, increase dosage to twice daily for 3 days., Starting 12/22/2013, Until Discontinued, OTC, Last Dose: Not Recorded  Refills: 4 ordered [id number] [place], [place]  She says she called the pharmacy this morning.

## 2014-01-15 NOTE — Telephone Encounter (Signed)
12/22/13 #90/4 rfs was ordered as a OTC medication and was not sent.  [facility] gave [facility] verbal order for   Valtrex 500 mg- Take 1 tablet by mouth daily. With Symptoms, increase dosage to twice daily for 3 days.  LM on patient's VM in regards to rx being sent. Routed to provider for review, encounter closed.

## 2014-01-15 NOTE — Telephone Encounter (Signed)
Thank you. Encounter closed. 

## 2014-02-08 ENCOUNTER — Other Ambulatory Visit: Payer: Self-pay

## 2014-02-08 DIAGNOSIS — Z1231 Encounter for screening mammogram for malignant neoplasm of breast: Secondary | ICD-10-CM

## 2014-02-10 DIAGNOSIS — G43909 Migraine, unspecified, not intractable, without status migrainosus: Secondary | ICD-10-CM | POA: Insufficient documentation

## 2014-03-09 ENCOUNTER — Ambulatory Visit
Admission: RE | Admit: 2014-03-09 | Discharge: 2014-03-09 | Disposition: A | Discharge status: Home / Self Care | Payer: BC Managed Care – PPO | Source: Ambulatory Visit

## 2014-03-09 DIAGNOSIS — Z1231 Encounter for screening mammogram for malignant neoplasm of breast: Secondary | ICD-10-CM

## 2014-03-11 ENCOUNTER — Ambulatory Visit (INDEPENDENT_AMBULATORY_CARE_PROVIDER_SITE_OTHER): Payer: BC Managed Care – PPO | Admitting: Podiatry

## 2014-03-11 ENCOUNTER — Telehealth: Payer: Self-pay | Admitting: *Deleted

## 2014-03-11 ENCOUNTER — Encounter: Payer: Self-pay | Admitting: Podiatry

## 2014-03-11 VITALS — BP 121/63 | HR 68 | Resp 16

## 2014-03-11 DIAGNOSIS — M779 Enthesopathy, unspecified: Secondary | ICD-10-CM

## 2014-03-11 DIAGNOSIS — L84 Corns and callosities: Secondary | ICD-10-CM

## 2014-03-11 DIAGNOSIS — M2041 Other hammer toe(s) (acquired), right foot: Secondary | ICD-10-CM

## 2014-03-11 MED ORDER — TRIAMCINOLONE ACETONIDE 10 MG/ML IJ SUSP
10.0000 mg | Freq: Once | INTRAMUSCULAR | Status: AC
  Administered 2014-03-11: 10 mg

## 2014-03-11 NOTE — Telephone Encounter (Signed)
"  I was there this morning to see Dr. Paulla Dolly.  I need to schedule my surgery."  Okay, which day did you decide?  "I think I want to do it on February 2nd if it's available or it could wait until our Spring Break in March."  February 2nd is available.  "Well no need in putting it off, let's go ahead and do it then.  Is there anything I need to do?"  Did you receive a surgery pamphlet?  If so, there is direction on how to go on-line and fill out the information needed by the surgical center.  They will call you 1 or 2 days prior to surgery and let you know your arrival time.  "Okay, thank you."

## 2014-03-11 NOTE — Patient Instructions (Signed)
Pre-Operative Instructions  Congratulations, you have decided to take an important step to improving your quality of life.  You can be assured that the doctors of [facility] will be with you every step of the way.  1. Plan to be at the surgery center/hospital at least 1 (one) hour prior to your scheduled time unless otherwise directed by the surgical center/hospital staff.  You must have a responsible adult accompany you, remain during the surgery and drive you home.  Make sure you have directions to the surgical center/hospital and know how to get there on time. 2. For hospital based surgery you will need to obtain a history and physical form from your family physician within 1 month prior to the date of surgery- we will give you a form for you primary physician.  3. We make every effort to accommodate the date you request for surgery.  There are however, times where surgery dates or times have to be moved.  We will contact you as soon as possible if a change in schedule is required.   4. No Aspirin/Ibuprofen for one week before surgery.  If you are on aspirin, any non-steroidal anti-inflammatory medications (Mobic, Aleve, Ibuprofen) you should stop taking it 7 days prior to your surgery.  You make take Tylenol  For pain prior to surgery.  5. Medications- If you are taking daily heart and blood pressure medications, seizure, reflux, allergy, asthma, anxiety, pain or diabetes medications, make sure the surgery center/hospital is aware before the day of surgery so they may notify you which medications to take or avoid the day of surgery. 6. No food or drink after midnight the night before surgery unless directed otherwise by surgical center/hospital staff. 7. No alcoholic beverages 24 hours prior to surgery.  No smoking 24 hours prior to or 24 hours after surgery. 8. Wear loose pants or shorts- loose enough to fit over bandages, boots, and casts. 9. No slip on shoes, sneakers are best. 10. Bring  your boot with you to the surgery center/hospital.  Also bring crutches or a walker if your physician has prescribed it for you.  If you do not have this equipment, it will be provided for you after surgery. 11. If you have not been contracted by the surgery center/hospital by the day before your surgery, call to confirm the date and time of your surgery. 12. Leave-time from work may vary depending on the type of surgery you have.  Appropriate arrangements should be made prior to surgery with your employer. 13. Prescriptions will be provided immediately following surgery by your doctor.  Have these filled as soon as possible after surgery and take the medication as directed. 14. Remove nail polish on the operative foot. 15. Wash the night before surgery.  The night before surgery wash the foot and leg well with the antibacterial soap provided and water paying special attention to beneath the toenails and in between the toes.  Rinse thoroughly with water and dry well with a towel.  Perform this wash unless told not to do so by your physician.  Enclosed: 1 Ice pack (please put in freezer the night before surgery)   1 Hibiclens skin cleaner   Pre-op Instructions  If you have any questions regarding the instructions, do not hesitate to call our office.  [place]: [address] [place], [place] [address] [phone]  [place]: [address] [place], [place] [address] [phone]  [place]: [address]  [place], [place] [address] [phone]  Dr. Richard   Tuchman DPM, Dr. Norman Regal DPM Dr. Richard Sikora DPM, Dr. M. Todd Hyatt DPM, Dr. Kathryn Egerton DPM 

## 2014-03-12 ENCOUNTER — Telehealth: Payer: Self-pay | Admitting: *Deleted

## 2014-03-12 NOTE — Telephone Encounter (Signed)
"  I think I'm going to hold off on having the procedure.  This will be the 4th surgery on this foot that I've had done by Dr. Paulla Dolly.  I want to get a second opinion from my Primary Care Doctor.  I'll call back if I want to reschedule."  Okay, I'll let Dr. Paulla Dolly know.

## 2014-03-12 NOTE — Progress Notes (Signed)
Subjective:     Patient ID: Deborah Fisher, female   DOB: 1951-02-13, 64 y.o.   MRN: [id number]  HPI patient presents with painful callus with inflammation around the fifth metatarsal head right which is occurred recently and is now complaining about the second third and fourth toes of the right foot stating that they're making increasingly hard for her to wear shoe gear and they're becoming increasingly uncomfortable   Review of Systems     Objective:   Physical Exam Neurovascular status intact with muscle strength adequate range of motion within normal limits and digits found to be well perfused. Patient has elevation of the second and third toes right with rigid contracture at the metatarsal and proximal interphalangeal joint and is noted to have keratotic lesion fourth toe right that's painful with previous syndactylization procedure the fourth interspace. Patient is noted to have inflammation of the fifth MPJ with keratotic lesion formation that's painful    Assessment:     Inflammatory capsulitis right fifth MPJ with lesion formation and hammertoe deformity second third and fourth toes of the right foot    Plan:     Reviewed both problems separately and at this time did a capsular injection right fifth MPJ 3 mg dexamethasone Kenalog 5 mg Xylocaine and after appropriate numbness debrided the lesion fully. Then went ahead and recommended fusion of the second and third toes right with internal pin fixation and arthroplasty fourth toe right and we allow her to read a consent form for correction explaining to her all possible complications that can occur and the fact there is no long-term guarantees as far as the success of surgery. Patient wants procedure understanding risk and signs consent form of current time after extensive review and is scheduled for outpatient surgery in the next few weeks

## 2014-05-03 ENCOUNTER — Encounter: Payer: Self-pay | Admitting: Neurology

## 2014-05-04 ENCOUNTER — Encounter: Payer: Self-pay | Admitting: Neurology

## 2014-05-04 ENCOUNTER — Ambulatory Visit (INDEPENDENT_AMBULATORY_CARE_PROVIDER_SITE_OTHER): Payer: BC Managed Care – PPO | Admitting: Neurology

## 2014-05-04 VITALS — BP 107/64 | HR 60 | Temp 98.7°F | Ht 65.0 in | Wt 171.0 lb

## 2014-05-04 DIAGNOSIS — G43109 Migraine with aura, not intractable, without status migrainosus: Secondary | ICD-10-CM | POA: Diagnosis not present
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
## 2014-05-04 NOTE — Patient Instructions (Signed)
Please remember, that vertigo can recur without warning. It can last hours or days. Please change positions slowly and always stay well-hydrated. Physical therapy with particular attention to vestibular rehabilitation can be very helpful. While there is no specific medication that helps with vertigo, some people get relief with as needed use of meclizine. Certain medications can exacerbate vertigo.   Please remember, common headache triggers are: sleep deprivation, dehydration, overheating, stress, hypoglycemia or skipping meals and blood sugar fluctuations, excessive pain medications or excessive alcohol use or caffeine withdrawal. Some people have food triggers such as aged cheese, orange juice or chocolate, especially dark chocolate, or MSG (monosodium glutamate). Try to avoid these headache triggers as much possible. It may be helpful to keep a headache diary to figure out what makes your headaches worse or brings them on and what alleviates them. Some people report headache onset after exercise but studies have shown that regular exercise may actually prevent headaches from coming. If you have exercise-induced headaches, please make sure that you drink plenty of fluid before and after exercising and that you do not over do it and do not overheat.

## 2014-05-31 ENCOUNTER — Ambulatory Visit (INDEPENDENT_AMBULATORY_CARE_PROVIDER_SITE_OTHER): Payer: BC Managed Care – PPO | Admitting: Podiatry

## 2014-05-31 ENCOUNTER — Encounter: Payer: Self-pay | Admitting: Podiatry

## 2014-05-31 VITALS — BP 114/66 | HR 73 | Resp 16

## 2014-05-31 DIAGNOSIS — B351 Tinea unguium: Secondary | ICD-10-CM

## 2014-05-31 DIAGNOSIS — M2041 Other hammer toe(s) (acquired), right foot: Secondary | ICD-10-CM | POA: Diagnosis not present

## 2014-05-31 NOTE — Progress Notes (Signed)
Subjective:     Patient ID: Deborah Fisher, female   DOB: 04-22-1950, 64 y.o.   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

## 2014-10-04 ENCOUNTER — Encounter: Payer: Self-pay | Admitting: Podiatry

## 2014-10-04 ENCOUNTER — Ambulatory Visit (INDEPENDENT_AMBULATORY_CARE_PROVIDER_SITE_OTHER): Payer: BC Managed Care – PPO | Admitting: Podiatry

## 2014-10-04 DIAGNOSIS — M779 Enthesopathy, unspecified: Secondary | ICD-10-CM | POA: Diagnosis not present

## 2014-10-04 DIAGNOSIS — L84 Corns and callosities: Secondary | ICD-10-CM

## 2014-10-04 MED ORDER — TRIAMCINOLONE ACETONIDE 10 MG/ML IJ SUSP
10.0000 mg | Freq: Once | INTRAMUSCULAR | Status: AC
  Administered 2014-10-04: 10 mg

## 2014-10-06 NOTE — Progress Notes (Signed)
Subjective:     Patient ID: Deborah Fisher, female   DOB: [birth year], 64 y.o.   MRN: [id number]  HPI patient presents with painful inflammation right fifth metatarsal with keratotic lesion formation that is painful when palpated   Review of Systems     Objective:   Physical Exam Neurovascular status intact with inflamed fifth MPJ right with fluid buildup and painful keratotic lesion upon palpation    Assessment:     Inflammatory capsulitis right fifth MPJ with keratotic lesion formation    Plan:     Careful injection administered to the right fifth MPJ 3 mg dexamethasone Kenalog 5 mg Xylocaine and debrided the lesion on the right fifth toe the tarsal

## 2014-10-20 ENCOUNTER — Telehealth: Payer: Self-pay | Admitting: Obstetrics & Gynecology

## 2014-10-20 NOTE — Telephone Encounter (Signed)
I recommend office visit first to evaluate the pain.  She has not been seen here for almost one year.  May still need ultrasound, but this decision can happen at that time. Thanks.

## 2014-10-20 NOTE — Telephone Encounter (Signed)
Patient was last seen for aex on 12/22/2013 with Dr.Miller. Spoke with patient. Patient states that she has a history of fibroids. Over the last couple of weeks has begun to have discomfort in her groin 3-4 times a week. Denies any sharp pain or abdominal bloating. States the discomfort is intermittent and happens with sitting, lying, and standing. Patient's next aex is scheduled for 02/17/2015. Patient is requesting to be seen sooner for evaluation of fibroids. Advised will need to speak with covering provider regarding symptoms and appointment needed and return call. Patient is agreeable.  Cc:Dr.Miller  Dr.Silva, okay for OV or should patient be seen in office for PUS?

## 2014-10-20 NOTE — Telephone Encounter (Signed)
Patient is having some pain. Thinks fibroids may be bothering her.

## 2014-10-20 NOTE — Telephone Encounter (Signed)
Spoke with patient. Advised of message as seen below from [place]. Patient is agreeable. Offered appointment for 8/18 but patient declines. Appointment scheduled for 8/19 at 10am with Dr.Miller. Patient is agreeable to date and time.  Cc:Dr.Miller  Routing to provider for final review. Patient agreeable to disposition. Will close encounter.

## 2014-10-22 ENCOUNTER — Ambulatory Visit (INDEPENDENT_AMBULATORY_CARE_PROVIDER_SITE_OTHER): Payer: BC Managed Care – PPO | Admitting: Obstetrics & Gynecology

## 2014-10-22 ENCOUNTER — Encounter: Payer: Self-pay | Admitting: Obstetrics & Gynecology

## 2014-10-22 VITALS — BP 104/70 | HR 72 | Resp 14 | Wt 169.0 lb

## 2014-10-22 DIAGNOSIS — N951 Menopausal and female climacteric states: Secondary | ICD-10-CM

## 2014-10-22 NOTE — Progress Notes (Signed)
Subjective:     Patient ID: Deborah Fisher, female   DOB: 1950/06/12, 64 y.o.   MRN: [id number]  HPI 13 G0P0 SAAF here for discussion of HRT.  Pt reports that she retired in early July.  Has been on HRT for several years and had tried to cut back without success due to continued hot flashes that she finds intolerable.  Pt now finds her insurance coverage is very different and she wants to discuss options.  Pt states that she told triage nurse she was having "fluttering" sensations so that she would be given an appointment but she really isn't have any abdominal or gynecological issues except for increased constipation.  Pt has hx of this and reports she is drinking a lot less water now that she is retired and at home more.  She knows this is where it is coming from.  Known hx of enlarged uterus with multiple fibroids.  Pt did try Effexor last year but didn't think that really did much and she stopped it due to weight gain that she thinks was associated.  She called her insurance company was advised to consider gabapentin or Evista.  She did some research on these and doesn't know why those recommendations were made.    Reviewed alternative options for pt (and reviewed Up to Date as well with pt).  SSRI and SNRI use discussed.  Gabapentin use discussed.  Clonidine use discussed.  Also, d/w pt transitioning to generic HRT and just doing self pay for this as well.  Pt states she really would like to try and stop HRT if at all possible.  Advised she will have worsening symptoms for up to a year but hopefully they will calm down if we can just get her enough symptom improvement during that time.  Based on coverage in her insurance book, feel could start at 100mg  gabapentin at night and 10mg  Paxil daily.  Side effects discussed.  Pt feels like she needs to consider this and will call back.  Review of Systems  All other systems reviewed and are negative.      Objective:   Physical Exam  Constitutional: She is  oriented to person, place, and time. She appears well-developed and well-nourished.  Abdominal: Soft. Bowel sounds are normal.  Genitourinary: Vagina normal. There is no rash, tenderness or lesion on the right labia. There is no rash, tenderness or lesion on the left labia. Uterus is enlarged (about 10 weeks, firm with nodularities consistent with known fibroids, no change in size since prior exam).  Lymphadenopathy:       Right: No inguinal adenopathy present.       Left: No inguinal adenopathy present.  Neurological: She is oriented to person, place, and time.  Skin: Skin is warm and dry.       Assessment:     Vasomotor symptoms, considering stopping HRT Known uterine fibroids, pelvic exam stable today     Plan:     Consider stopping HRT and using Gabapentin 100mg  nightly and Paxil 10mg  daily for symptom improvement vs genetic HRT and self pay for this.  Pt will call back and let me know what she wants to do.  ~20 minutes spent with patient >50% of time was in face to face discussion of above.

## 2014-10-28 ENCOUNTER — Telehealth: Payer: Self-pay | Admitting: Obstetrics & Gynecology

## 2014-10-28 NOTE — Telephone Encounter (Signed)
Patient was last seen 10/22/14 with Dr.Miller. Patient was told to call back with her "decision" and talk with Dr.Miller's nurse.

## 2014-10-29 MED ORDER — CONJ ESTROG-MEDROXYPROGEST ACE 0.3-1.5 MG PO TABS: 1.0000 | ORAL_TABLET | Freq: Every day | ORAL | Status: DC

## 2014-10-29 NOTE — Telephone Encounter (Signed)
Message left to return call to [place] at [phone].   Order pended.

## 2014-10-29 NOTE — Telephone Encounter (Signed)
First she should decrease the dosage to 0.3/1.5mg  daily and then decrease to every other day.  If wants lower dosage, we can send rx to pharmacy for her.

## 2014-10-29 NOTE — Telephone Encounter (Signed)
Patient returned call. She is agreeable to dose decrease in Prempro.  She will change to [employer] 11/04/14 and they will allow for 30 days of transition per patient. Advised that pharmacy will contact our office if prior authorization is required.  [facility] order [facility] per patient request. Patient has annual exam with Dr. Sabra Heck 02/2015 and will discuss with Dr. Sabra Heck at that time how she is doing on Prempro.   Will close encounter.

## 2014-10-29 NOTE — Telephone Encounter (Signed)
Spoke with patient. She had office visit with Dr. Sabra Heck to discuss HRT.  Patient states she would like to try to wean off of Prempro over 6-8 months while taking a tablet every other (she read this as an option online) and then on the days she is not taking Prempro she will try to to start with Estroven.   Patient feels that gabapentin will cause her weight gain that she had prior with Effexor and does not want to use it.  Wants to know Dr. Ammie Ferrier opinion on this plan to taper off of Prempro.   She states she may need prior authorization to stay on Prempro through insurance. Called [facility] and they ran test claim. No prior authorization is required, however, cost monthly is 40.00.

## 2014-11-05 ENCOUNTER — Ambulatory Visit: Payer: BC Managed Care – PPO | Admitting: Neurology

## 2014-11-05 ENCOUNTER — Ambulatory Visit: Payer: Self-pay | Admitting: Neurology

## 2014-11-09 ENCOUNTER — Encounter: Payer: Self-pay | Admitting: Neurology

## 2014-11-09 ENCOUNTER — Ambulatory Visit (INDEPENDENT_AMBULATORY_CARE_PROVIDER_SITE_OTHER): Payer: Medicare Other | Admitting: Neurology

## 2014-11-09 VITALS — BP 116/60 | HR 70 | Resp 16 | Ht 65.0 in | Wt 167.0 lb

## 2014-11-09 DIAGNOSIS — G43109 Migraine with aura, not intractable, without status migrainosus: Secondary | ICD-10-CM | POA: Diagnosis not present
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
## 2014-11-09 NOTE — Patient Instructions (Addendum)
Please remember, common headache triggers are: sleep deprivation, dehydration, overheating, stress, hypoglycemia or skipping meals and blood sugar fluctuations, excessive pain medications or excessive alcohol use or caffeine withdrawal. Some people have food triggers such as aged cheese, orange juice or chocolate, especially dark chocolate, or MSG (monosodium glutamate). Try to avoid these headache triggers as much possible. It may be helpful to keep a headache diary to figure out what makes your headaches worse or brings them on and what alleviates them. Some people report headache onset after exercise but studies have shown that regular exercise may actually prevent headaches from coming. If you have exercise-induced headaches, please make sure that you drink plenty of fluid before and after exercising and that you do not over do it and do not overheat.  Please continue with your exercises! Your MRI and your exam are normal.

## 2014-11-23 ENCOUNTER — Telehealth: Payer: Self-pay | Admitting: Obstetrics & Gynecology

## 2014-11-23 MED ORDER — CITALOPRAM HYDROBROMIDE 10 MG PO TABS: 10.0000 mg | ORAL_TABLET | Freq: Every day | ORAL | Status: DC

## 2014-11-23 NOTE — Telephone Encounter (Signed)
Spoke with patient. Advised of message as seen below from [place]. Patient is agreeable. Will start taking the Celexa when she is finished with her Prempro. Only has 3 Prempro tablets left. Rx for Celexa 10 mg daily #30 2RF sent to pharmacy on file. Patient is agreeable.  Routing to provider for final review. Patient agreeable to disposition. Will close encounter.

## 2014-11-23 NOTE — Telephone Encounter (Signed)
Spoke with patient. Patient states that she received a letter from her insurance company stating that her Prempro will no longer be covered and that she will need to try an alternative medication before it can be approved. States she has done research and would like to try Celexa at this time. Advised I will speak with Dr.Miller regarding alternative and return call. Patient is agreeable.

## 2014-11-23 NOTE — Telephone Encounter (Signed)
Patient states that Prempro is not covered through her insurance. Would like to speak with nurse to go over other options.  Best contact [phone]

## 2014-11-23 NOTE — Telephone Encounter (Signed)
Ok to start on Celexa 10mg  daily.  Ok to send to pharmacy.

## 2014-12-27 ENCOUNTER — Telehealth: Payer: Self-pay | Admitting: Obstetrics & Gynecology

## 2014-12-27 MED ORDER — CITALOPRAM HYDROBROMIDE 20 MG PO TABS: 20.0000 mg | ORAL_TABLET | Freq: Every day | ORAL | Status: DC

## 2014-12-27 NOTE — Telephone Encounter (Signed)
Patient wants to speak with the nurse no information given. °

## 2014-12-27 NOTE — Telephone Encounter (Signed)
Spoke with patient. Patient states that she has been on Citalopram 10 mg for a little over one month. Is taking this for menopausal symptoms. States "I have not noticed a difference at all. I have still having a hot flash every hour. I was wondering if I could increase the dosage?" Advised I will speak with Dr.Miller and return call with further recommendations. Patient is agreeable.

## 2014-12-27 NOTE — Telephone Encounter (Signed)
Spoke with patient. Advised of message as seen below from [place]. Rx fro Citalopram 20 mg #30 3RF sent to pharmacy on file. Patient is agreeable.  Routing to provider for final review. Patient agreeable to disposition. Will close encounter.

## 2014-12-27 NOTE — Telephone Encounter (Signed)
Yes.  Can increase to 20mg  daily.  Ok to send in rx.

## 2015-02-11 ENCOUNTER — Other Ambulatory Visit: Payer: Self-pay

## 2015-02-11 DIAGNOSIS — Z1231 Encounter for screening mammogram for malignant neoplasm of breast: Secondary | ICD-10-CM

## 2015-02-17 ENCOUNTER — Ambulatory Visit: Payer: BC Managed Care – PPO | Admitting: Obstetrics & Gynecology

## 2015-02-21 ENCOUNTER — Ambulatory Visit (INDEPENDENT_AMBULATORY_CARE_PROVIDER_SITE_OTHER): Payer: Medicare Other | Admitting: Obstetrics & Gynecology

## 2015-02-21 ENCOUNTER — Encounter: Payer: Self-pay | Admitting: Obstetrics & Gynecology

## 2015-02-21 VITALS — BP 126/60 | HR 60 | Resp 18 | Ht 64.5 in | Wt 166.0 lb

## 2015-02-21 DIAGNOSIS — Z0000 Encounter for general adult medical examination without abnormal findings: Secondary | ICD-10-CM

## 2015-02-21 DIAGNOSIS — Z01419 Encounter for gynecological examination (general) (routine) without abnormal findings: Secondary | ICD-10-CM

## 2015-02-21 DIAGNOSIS — Z124 Encounter for screening for malignant neoplasm of cervix: Secondary | ICD-10-CM | POA: Diagnosis not present

## 2015-02-21 LAB — POCT URINALYSIS DIPSTICK
Bilirubin, UA: NEGATIVE
Blood, UA: NEGATIVE
Glucose, UA: NEGATIVE
KETONES UA: NEGATIVE
Nitrite, UA: NEGATIVE
PH UA: 5
PROTEIN UA: NEGATIVE
Urobilinogen, UA: NEGATIVE

## 2015-02-21 MED ORDER — CITALOPRAM HYDROBROMIDE 20 MG PO TABS: 20.0000 mg | ORAL_TABLET | Freq: Every day | ORAL | Status: DC

## 2015-02-21 NOTE — Patient Instructions (Addendum)
Have a Hepatitis C testing done or discuss with Dr. Brigitte Pulse at your next visit.

## 2015-02-21 NOTE — Progress Notes (Signed)
64 y.o. G0P0 SingleAfrican AmericanF here for annual exam.  Pt reports she feels the citalopram is helping her hot flashes.  She's still having them about once and hour.  She is still off her HRT and she's glad to have stopped this.  Pt retired July 1st.  She is working on weight loss.   She just became a guardian ad litem.  She is working on a paper.    Denies vaginal bleeding.    Pt reports when she has a bowel movement, there are times when she feels the bowel movement is "passing by or irritating something" and that there is some discomfort.  She is on a stool softener.    Patient's last menstrual period was 11/04/1990.          Sexually active: No.  The current method of family planning is post menopausal status.    Exercising: Yes.    Walking, hiking, yoga Smoker:  no  Health Maintenance: Pap:  12/01/12 Neg. HR HPV:neg History of abnormal Pap:  no MMG:  03/09/14 BIRADS1:neg Colonoscopy:  02/22/10 repeat 5 - 10 years.  Pt feels BMD:   2014 Improved.  Doing this with PCP TDaP: PCP Started the pneumonia series.  Has done shingles vaccine.   Screening Labs: PCP, Hb today: PCP, Urine today: WBC=Trace   reports that she has never smoked. She has never used smokeless tobacco. She reports that she drinks about 4.2 oz of alcohol per week. She reports that she does not use illicit drugs.  Past Medical History  Diagnosis Date  . DI (detrusor instability)   . Fibroid   . Menopausal symptoms   . Non-Hodgkin lymphoma ([place]) 1992    treated with CHOP  . Osteoporosis     h/o 3 stress fractures  . Diabetes ([place])     pre-diabetes, controlling with diet and exercise  . Femur fracture ([place]) 2011  . Chronic constipation   . Postmenopausal HRT (hormone replacement therapy)   . Headache   . Tennis elbow Right     Past Surgical History  Procedure Laterality Date  . Pelvic laparoscopy    . Toe surgery    . Biopsy for non hodgkins lymphoma  1992  . Dilation and curettage of uterus       Current Outpatient Prescriptions  Medication Sig Dispense Refill  . Calcium Carbonate-Vitamin D (CALCIUM + D PO) Take by mouth.    . Cetirizine HCl (ZYRTEC ALLERGY PO) Take by mouth.    . Cholecalciferol (VITAMIN D PO) Take by mouth.    . citalopram (CELEXA) 20 MG tablet Take 1 tablet (20 mg total) by mouth daily. 30 tablet 3  . Cyanocobalamin (VITAMIN B 12 PO) Take by mouth daily.    . diclofenac sodium (VOLTAREN) 1 % GEL APPLY 2 GRAMS TO ELBOW EVERY 6 HOURS AS NEEDED FOR PAIN  3  . fluticasone (FLONASE) 50 MCG/ACT nasal spray Place into both nostrils daily.     . Multiple Vitamin (MULTIVITAMIN) tablet Take 1 tablet by mouth daily.    . rizatriptan (MAXALT-MLT) 10 MG disintegrating tablet Take by mouth.     No current facility-administered medications for this visit.    Family History  Problem Relation Age of Onset  . Diabetes Mother 62  . Hypertension Mother   . Alzheimer's disease Mother   . Hypertension Father   . Alcoholism Father     deceased age 38  . Diabetes Brother   . Hypertension Brother   . Colon cancer  Maternal Uncle   . Heart disease Maternal Grandmother   . Breast cancer Cousin     Mat. 1st cousin-Age 13  . Breast cancer Maternal Aunt     Age 61's  . Glaucoma Mother   . Other Paternal Grandmother     MI  . Cirrhosis Father     ROS:  Pertinent items are noted in HPI.  Otherwise, a comprehensive ROS was negative.  Exam:   Ht 5' 4.5" (1.638 m)  Wt 166 lb (75.297 kg)  BMI 28.06 kg/m2  LMP 11/04/1990  Weight change: -7#   Height: 5' 4.5" (163.8 cm)  Ht Readings from Last 3 Encounters:  02/21/15 5' 4.5" (1.638 m)  11/09/14 5\' 5"  (1.651 m)  05/04/14 5\' 5"  (1.651 m)    General appearance: alert, cooperative and appears stated age Head: Normocephalic, without obvious abnormality, atraumatic Neck: no adenopathy, supple, symmetrical, trachea midline and thyroid normal to inspection and palpation Lungs: clear to auscultation bilaterally Breasts: normal  appearance, no masses or tenderness Heart: regular rate and rhythm Abdomen: soft, non-tender; bowel sounds normal; no masses,  no organomegaly Extremities: extremities normal, atraumatic, no cyanosis or edema Skin: Skin color, texture, turgor normal. No rashes or lesions Lymph nodes: Cervical, supraclavicular, and axillary nodes normal. No abnormal inguinal nodes palpated Neurologic: Grossly normal   Pelvic: External genitalia:  no lesions              Urethra:  normal appearing urethra with no masses, tenderness or lesions              Bartholins and Skenes: normal                 Vagina: normal appearing vagina with normal color and discharge, no lesions              Cervix: no lesions              Pap taken: No. Bimanual Exam:  Uterus:  normal size, contour, position, consistency, mobility, non-tender              Adnexa: normal adnexa and no mass, fullness, tenderness               Rectovaginal: Confirms               Anus:  normal sphincter tone, no lesions  Chaperone was present for exam.  A:  Well Woman with normal exam  PMP off HRT Hot flashes with Citalopram use HSV history  H/O large fibroid uterus with calcified fibroids (compared to prior ultrasound and physical exam is similar to measurements)  Osteopenia, on Forteo x 2 yr.  Last BMD improved.  Followed by Dr. Brigitte Pulse.  P: Mammogram yearly pap smear with HR HPV 2014.  Pap today. Valtrex 500mg  daily, increase to BID x 3 days prn outbreaks. Paper rx given per pt request Citalopram 20mg  daily.  #30/13RF. return annually or prn

## 2015-02-24 LAB — IPS PAP SMEAR ONLY

## 2015-03-11 ENCOUNTER — Ambulatory Visit
Admission: RE | Admit: 2015-03-11 | Discharge: 2015-03-11 | Disposition: A | Discharge status: Home / Self Care | Payer: Medicare Other | Source: Ambulatory Visit

## 2015-03-11 DIAGNOSIS — Z1231 Encounter for screening mammogram for malignant neoplasm of breast: Secondary | ICD-10-CM

## 2015-05-10 ENCOUNTER — Ambulatory Visit: Payer: Medicare Other | Admitting: Neurology

## 2015-05-20 ENCOUNTER — Ambulatory Visit: Payer: Medicare Other | Admitting: Podiatry

## 2015-07-05 ENCOUNTER — Ambulatory Visit: Payer: Medicare Other | Admitting: Neurology

## 2015-07-12 ENCOUNTER — Encounter: Payer: Self-pay | Admitting: Neurology

## 2015-07-13 ENCOUNTER — Ambulatory Visit (INDEPENDENT_AMBULATORY_CARE_PROVIDER_SITE_OTHER): Payer: Medicare Other | Admitting: Podiatry

## 2015-07-13 ENCOUNTER — Encounter: Payer: Self-pay | Admitting: Podiatry

## 2015-07-13 DIAGNOSIS — M779 Enthesopathy, unspecified: Secondary | ICD-10-CM | POA: Diagnosis not present

## 2015-07-13 DIAGNOSIS — L84 Corns and callosities: Secondary | ICD-10-CM

## 2015-07-13 MED ORDER — TRIAMCINOLONE ACETONIDE 10 MG/ML IJ SUSP
10.0000 mg | Freq: Once | INTRAMUSCULAR | Status: AC
  Administered 2015-07-13: 10 mg

## 2015-07-13 NOTE — Progress Notes (Signed)
Subjective:     Patient ID: Deborah Fisher, female   DOB: 1950-06-23, 65 y.o.   MRN: [id number]  HPI patient presents with painful callus on the fifth metatarsal head right with fluid buildup lesion formation and difficulty wearing tighter shoes   Review of Systems     Objective:   Physical Exam Neurovascular status intact muscle strength adequate with patient found to have inflammatory changes around the fifth metatarsal head right with fluid buildup around the area and lesion formation    Assessment:     Inflammatory capsulitis right fifth MPJ with keratotic lesion formation    Plan:     Went ahead did a proximal nerve block today and then injected the right fifth MPJ 3 mg dexamethasone Kenalog 5 mg Xylocaine and using sterile and some is did full debridement of lesion. Patient will reappoint as needed

## 2015-07-27 ENCOUNTER — Telehealth: Payer: Self-pay | Admitting: *Deleted

## 2015-07-27 NOTE — Telephone Encounter (Signed)
Message For: [facility]                  Taken 24-MAY-17 at  2:26PM by TNA ------------------------------------------------------------ Deborah Fisher              CID [id number]  Patient SAME                 Pt's Dr Rexene Alberts        Area Code [id number] 5/25  APPT RESCHEDULE                                                                                  Disp:Y/N N If Y = C/B If No Response In 27minutes ============================================================

## 2015-07-28 ENCOUNTER — Ambulatory Visit: Payer: Medicare Other | Admitting: Neurology

## 2015-10-03 ENCOUNTER — Telehealth: Payer: Self-pay | Admitting: Obstetrics & Gynecology

## 2015-10-03 NOTE — Telephone Encounter (Signed)
Agree with recommendations.  

## 2015-10-03 NOTE — Telephone Encounter (Signed)
Patient would like to stop taking citalopram (CELEXA) 20 MG tablet. Patient would like to discuss what steps to take to stop taking this prescription.

## 2015-10-03 NOTE — Telephone Encounter (Signed)
Spoke with patient. Advised patient she will need to alternate between taking Celexa 20 mg daily and 10 mg daily for 2 weeks. She will then need to take Celexa 10 mg daily for two weeks. Then Celexa 10 mg every other day for 1 week then she may stop the medication. She is agreeable and verbalizes understanding. She will return call with any questions or concerns.  Routing to provider for final review. Patient agreeable to disposition. Will close encounter.

## 2015-10-03 NOTE — Telephone Encounter (Signed)
Dr.Miller, okay to advise patient to alternative taking Celexa 20 mg per day and 10 mg per day for two weeks. Then take 10 mg a day for 2 weeks. Then 10 mg every other day for 1 week then stop?

## 2015-10-04 ENCOUNTER — Encounter: Payer: Self-pay | Admitting: Neurology

## 2015-10-04 ENCOUNTER — Ambulatory Visit (INDEPENDENT_AMBULATORY_CARE_PROVIDER_SITE_OTHER): Payer: Medicare Other | Admitting: Neurology

## 2015-10-04 VITALS — BP 122/68 | HR 72 | Resp 16 | Ht 64.5 in | Wt 165.0 lb

## 2015-10-04 DIAGNOSIS — G43109 Migraine with aura, not intractable, without status migrainosus: Secondary | ICD-10-CM
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
## 2015-10-04 NOTE — Patient Instructions (Signed)
You look well! Exam looks great.  Continue to stay active! I will see you back as needed.

## 2015-10-21 ENCOUNTER — Telehealth: Payer: Self-pay | Admitting: Podiatry

## 2015-10-21 DIAGNOSIS — B351 Tinea unguium: Secondary | ICD-10-CM

## 2015-10-21 NOTE — Telephone Encounter (Signed)
Called patient and left a voicemail letting her know that I'm mailing out her copy of x-rays on a cd to [facility] attn.: Dr. Sharol Given. Also informed patient of the $5.00 fee for the copy of x-rays.

## 2015-11-17 ENCOUNTER — Encounter: Payer: Self-pay | Admitting: Podiatry

## 2015-11-17 ENCOUNTER — Ambulatory Visit (INDEPENDENT_AMBULATORY_CARE_PROVIDER_SITE_OTHER): Payer: Medicare Other

## 2015-11-17 ENCOUNTER — Ambulatory Visit (INDEPENDENT_AMBULATORY_CARE_PROVIDER_SITE_OTHER): Payer: Medicare Other | Admitting: Podiatry

## 2015-11-17 VITALS — BP 104/69 | HR 65 | Resp 16

## 2015-11-17 DIAGNOSIS — M779 Enthesopathy, unspecified: Secondary | ICD-10-CM

## 2015-11-17 DIAGNOSIS — M722 Plantar fascial fibromatosis: Secondary | ICD-10-CM | POA: Diagnosis not present

## 2015-11-17 DIAGNOSIS — M79672 Pain in left foot: Secondary | ICD-10-CM

## 2015-11-17 DIAGNOSIS — M79671 Pain in right foot: Secondary | ICD-10-CM | POA: Diagnosis not present

## 2015-11-17 DIAGNOSIS — L84 Corns and callosities: Secondary | ICD-10-CM

## 2015-11-17 DIAGNOSIS — M21621 Bunionette of right foot: Secondary | ICD-10-CM

## 2015-11-17 MED ORDER — TRIAMCINOLONE ACETONIDE 10 MG/ML IJ SUSP
10.0000 mg | Freq: Once | INTRAMUSCULAR | Status: AC
  Administered 2015-11-17: 10 mg

## 2015-11-17 NOTE — Progress Notes (Addendum)
Subjective:     Patient ID: Deborah Fisher, female   DOB: May 15, 1950, 65 y.o.   MRN: [id number]  HPI patient states I'm getting a lot of pain in the right side of my foot again and I'm also getting pain in the arches of both feet that I wanted to get checked   Review of Systems     Objective:   Physical Exam Neurovascular status intact muscle strength adequate with discomfort with keratotic lesion on the lateral side of the right fifth metatarsal with lesion that is painful when palpated. Moderate discomfort in the arch region bilateral which is usually due to excessive activity    Assessment:     Chronic inflammatory changes around the fifth metatarsal head right with lesion formation with fluid buildup and pain in the mid arch area bilateral    Plan:     H&P conditions reviewed and did careful injection around the fifth MPJ 3 mg [place] some Kenalog and debrided lesion. I discussed possibility for tailor bunionectomy of one point in future and I dispensed night splint to help to stretch the plantar arch bilateral  X-ray report indicated that there is some depression of the arch and irritation around the fifth metatarsal head right

## 2015-11-17 NOTE — Patient Instructions (Signed)

## 2015-12-28 ENCOUNTER — Ambulatory Visit (INDEPENDENT_AMBULATORY_CARE_PROVIDER_SITE_OTHER): Payer: Medicare Other | Admitting: Sports Medicine

## 2015-12-28 ENCOUNTER — Encounter: Payer: Self-pay | Admitting: Sports Medicine

## 2015-12-28 DIAGNOSIS — L84 Corns and callosities: Secondary | ICD-10-CM

## 2015-12-28 DIAGNOSIS — R252 Cramp and spasm: Secondary | ICD-10-CM | POA: Diagnosis not present

## 2015-12-28 DIAGNOSIS — M79671 Pain in right foot: Secondary | ICD-10-CM

## 2015-12-28 DIAGNOSIS — M79672 Pain in left foot: Secondary | ICD-10-CM | POA: Diagnosis not present

## 2015-12-28 NOTE — Progress Notes (Signed)
Deborah Fisher - 65 y.o. female MRN [id number]  Date of birth: 05-29-1950  SUBJECTIVE:  Including CC & ROS.  Chief Complaint  Patient presents with  . Foot Pain    Deborah Fisher is a 65 year old female that is presenting with left midfoot pain, toe cramping, and fifth toe pain on her right foot. She is an avid hiker and hikes 3 days per week. Her most strenuous hike is a 12 mile hike at 3000 feet. She's been doing less hiking over the last month and some of her symptoms seem to be improving.  Her left midfoot pain is occurring near her navicular. This pain radiates up proximally on the medial aspect. This is been going on for 2-3 months. She denies any inciting event. She was diagnosed with plantar fasciitis by her podiatrist. She reports the pain occurs mostly at the end of hikes or at night. She denies any pain during the hike.  She experiences toe cramps in her fourth digit of her right foot. She reports she is a heavy sweater while hiking. She has tried salt tablets and has had decrease in the amount of cramping.  She has a history of a corn formation on the fifth digit of her right foot. She has had 2 surgeries to try to help this out. The most recent surgery in 2012 took the fifth phalange completely out. Since that occurred she has had callus formation in this area. She does not have any more surgery.  ROS: No unexpected weight loss, fever, chills, swelling, instability, redness, otherwise see HPI   HISTORY: Past Medical, Surgical, Social, and Family History Reviewed & Updated per EMR.   Pertinent Historical Findings include: PMSHx -  Non hogdskin's lymphoma 25 years ago, osteoporosis  PSHx -  No tobacco use, occasional alcohol use. Retired  FHx -  HTN, DM2  Medications - calcium and vitamin d   DATA REVIEWED: 11/17/15: Left foot x-ray: Her left foot does not show any significant midfoot arthritis or any calcaneal spurs. There is some haziness daily insertion of the peroneal tendon at the  base of the fifth metatarsal.  PHYSICAL EXAM:  VS: BP:101/90  HR:(!) 58bpm  TEMP: ( )  RESP:   HT:5\' 5"  (165.1 cm)   WT:160 lb (72.6 kg)  BMI:26.7 PHYSICAL EXAM: Gen: NAD, alert, cooperative with exam, well-appearing HEENT: clear conjunctiva, EOMI CV:  no edema, capillary refill brisk,  Resp: non-labored, normal speech Skin: no rashes, normal turgor  Neuro: no gross deficits.  Psych:  alert and oriented Feet:  Neutral arch. TTP on the medial Navicular on the left foot.  No TTP of the medial calcaneous on the left foot.  Normal ankle ROM  Normal strength  Right 5th digit on right foot is sutured to 5th digit  Callus formation on the lateral aspect of the 5th digit on the right foot  Mild callus formation occurring between the 2-4 MT on plantar aspect of each foot.  Normal gait.  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  - provided with green sport insoles as well as exercises  - follow up PRN   Cramp of toe Sounds like she has excessive salter with perspiration.  - encouraged to use salt tabs with hiking  - hopefully changes made to her insoles will help as well.   Callus of foot Would most  likely avoid any further surgery on her right foot and especially the 5th MT.  - placed in green sport insoles with a small scaphoid pad b/l. Small scaphoid was also placed in her KEEN hiking boots. A 5th ray post was also placed in the green sport insoles and the KEEN insoles.

## 2015-12-28 NOTE — Patient Instructions (Signed)
Thank you for coming in,   Most likely your left foot is related to posterior tibial tendinopathy.   The cramping is possible to be related to your foot surgery or that you are a heavy sweater or both.   The callus formation should be helped with the extra padding that we have placed.   You can placed the small scahpoid pad in your keen boots once you try them out.     Please feel free to call with any questions or concerns at any time, at [phone]. --Dr. Raeford Razor

## 2016-01-01 DIAGNOSIS — L84 Corns and callosities: Secondary | ICD-10-CM | POA: Insufficient documentation

## 2016-01-01 DIAGNOSIS — M79671 Pain in right foot: Secondary | ICD-10-CM | POA: Insufficient documentation

## 2016-01-01 DIAGNOSIS — M79672 Pain in left foot: Secondary | ICD-10-CM | POA: Insufficient documentation

## 2016-01-01 NOTE — Assessment & Plan Note (Signed)
Sounds like she has excessive salter with perspiration.  - encouraged to use salt tabs with hiking  - hopefully changes made to her insoles will help as well.

## 2016-01-01 NOTE — Assessment & Plan Note (Signed)
Would most likely avoid any further surgery on her right foot and especially the 5th MT.  - placed in green sport insoles with a small scaphoid pad b/l. Small scaphoid was also placed in her KEEN hiking boots. A 5th ray post was also placed in the green sport insoles and the KEEN insoles.

## 2016-01-01 NOTE — Assessment & Plan Note (Signed)
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 She had previously been diagnosed with PF.  - provided with green sport insoles as well as exercises  - follow up PRN

## 2016-01-25 ENCOUNTER — Encounter (INDEPENDENT_AMBULATORY_CARE_PROVIDER_SITE_OTHER): Payer: Self-pay

## 2016-01-25 ENCOUNTER — Ambulatory Visit (INDEPENDENT_AMBULATORY_CARE_PROVIDER_SITE_OTHER): Payer: Medicare Other | Admitting: Sports Medicine

## 2016-01-25 ENCOUNTER — Encounter: Payer: Self-pay | Admitting: Sports Medicine

## 2016-01-25 DIAGNOSIS — M79671 Pain in right foot: Secondary | ICD-10-CM | POA: Diagnosis not present

## 2016-01-25 DIAGNOSIS — L84 Corns and callosities: Secondary | ICD-10-CM | POA: Diagnosis not present

## 2016-01-25 DIAGNOSIS — R269 Unspecified abnormalities of gait and mobility: Secondary | ICD-10-CM

## 2016-01-25 NOTE — Progress Notes (Signed)
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

## 2016-01-25 NOTE — Assessment & Plan Note (Signed)
Some of this is post surgical after fusion of rays 4 and 5  Try to support  OK cramp may be neurological from interdigital n

## 2016-01-25 NOTE — Assessment & Plan Note (Signed)
From transverse arch collapse  Will add MT pads

## 2016-02-01 ENCOUNTER — Other Ambulatory Visit: Payer: Self-pay | Admitting: Obstetrics & Gynecology

## 2016-02-01 DIAGNOSIS — Z1231 Encounter for screening mammogram for malignant neoplasm of breast: Secondary | ICD-10-CM

## 2016-02-16 ENCOUNTER — Ambulatory Visit: Payer: Medicare Other | Admitting: Podiatry

## 2016-03-07 ENCOUNTER — Other Ambulatory Visit: Payer: Self-pay | Admitting: Obstetrics & Gynecology

## 2016-03-07 ENCOUNTER — Ambulatory Visit: Payer: Medicare Other | Admitting: Podiatry

## 2016-03-08 ENCOUNTER — Other Ambulatory Visit: Payer: Self-pay | Admitting: Obstetrics & Gynecology

## 2016-03-08 NOTE — Telephone Encounter (Signed)
Patient called to check on the status of her refill request for citalopram.  She only has enough left for today.  Her pharmacy on file is correct.

## 2016-03-08 NOTE — Telephone Encounter (Signed)
Medication refill request: Celexa  Last AEX:  02/21/15 SM  Next AEX: 06/15/16 SM  Last MMG (if hormonal medication request): 03/11/15 BIRADS1:Neg. Has appt 03/12/16  Refill authorized: 11/23/14 #[id number]. Today please advise.

## 2016-03-12 ENCOUNTER — Ambulatory Visit
Admission: RE | Admit: 2016-03-12 | Discharge: 2016-03-12 | Disposition: A | Discharge status: Home / Self Care | Payer: Medicare Other | Source: Ambulatory Visit | Attending: Obstetrics & Gynecology | Admitting: Obstetrics & Gynecology

## 2016-03-12 DIAGNOSIS — Z1231 Encounter for screening mammogram for malignant neoplasm of breast: Secondary | ICD-10-CM

## 2016-03-16 ENCOUNTER — Ambulatory Visit (INDEPENDENT_AMBULATORY_CARE_PROVIDER_SITE_OTHER): Payer: Medicare Other | Admitting: Podiatry

## 2016-03-16 ENCOUNTER — Encounter: Payer: Self-pay | Admitting: Podiatry

## 2016-03-16 DIAGNOSIS — M722 Plantar fascial fibromatosis: Secondary | ICD-10-CM | POA: Diagnosis not present

## 2016-03-16 DIAGNOSIS — D3610 Benign neoplasm of peripheral nerves and autonomic nervous system, unspecified: Secondary | ICD-10-CM | POA: Diagnosis not present

## 2016-03-16 DIAGNOSIS — L84 Corns and callosities: Secondary | ICD-10-CM

## 2016-03-16 DIAGNOSIS — M779 Enthesopathy, unspecified: Secondary | ICD-10-CM

## 2016-03-16 MED ORDER — TRIAMCINOLONE ACETONIDE 10 MG/ML IJ SUSP
10.0000 mg | Freq: Once | INTRAMUSCULAR | Status: AC
  Administered 2016-03-16: 10 mg

## 2016-03-18 NOTE — Progress Notes (Signed)
Subjective:     Patient ID: Deborah Fisher, female   DOB: 21-Nov-1950, 66 y.o.   MRN: [id number]  HPI patient presents stating she's getting a lot of pain in the outside of her right foot again and also between the third and fourth toes of her right foot she's been developing some numbness and shooting pains at time especially when she hikes   Review of Systems     Objective:   Physical Exam Neurovascular status intact muscle strength adequate with discomfort in the outside of the right foot around the fifth MPJ with keratotic tissue formation and popping like irritation of the third interspace right over left with positive Biagio Borg sign    Assessment:     Structural deformity outside of her right foot with inflammatory care of ptosis and inflammatory capsulitis along with 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 At this time for the outside of the right foot I did do a careful steroidal injection of 2 mg Dexon some Kenalog 5 mg Xylocaine to reduce inflammation and anesthetized the area. I then debrided the lesion and I instructed this patient on reduced activity for several days and then also reviewed neuroma and the considerations for treatment if it were to get worse. We could consider injection or possible excision depending on how it responds

## 2016-05-16 ENCOUNTER — Ambulatory Visit (INDEPENDENT_AMBULATORY_CARE_PROVIDER_SITE_OTHER): Payer: Medicare Other | Admitting: Podiatry

## 2016-05-16 DIAGNOSIS — D3610 Benign neoplasm of peripheral nerves and autonomic nervous system, unspecified: Secondary | ICD-10-CM | POA: Diagnosis not present

## 2016-05-16 DIAGNOSIS — L84 Corns and callosities: Secondary | ICD-10-CM

## 2016-05-17 NOTE — Progress Notes (Signed)
Subjective:     Patient ID: Deborah Fisher, female   DOB: Jan 13, 1951, 66 y.o.   MRN: [id number]  HPI patient presents stating the shooting pain between her third and fourth toes is getting worse right and left and she has lesions on the bottom and side of her foot right   Review of Systems     Objective:   Physical Exam Neurovascular status intact with patient found to have shooting discomforts in the third interspace bilateral with radiating-like pains that are stopping her ability to hike to a significant degree. Also noted to have lesion formation bilateral    Assessment:     Probable neuroma symptomatology bilateral with possibility for inflammatory condition and keratotic lesion bilateral    Plan:     H&P conditions reviewed and debridement accomplished. I then went ahead did sterile prep bilateral and injected directly into the third interspace bilateral with a purified alcohol Marcaine solution which was tolerated well and we will review again in 2 weeks to see results and decide what else may be necessary

## 2016-05-22 ENCOUNTER — Ambulatory Visit (INDEPENDENT_AMBULATORY_CARE_PROVIDER_SITE_OTHER): Payer: Medicare Other | Admitting: Sports Medicine

## 2016-05-22 ENCOUNTER — Encounter: Payer: Self-pay | Admitting: Sports Medicine

## 2016-05-22 DIAGNOSIS — M79672 Pain in left foot: Secondary | ICD-10-CM

## 2016-05-22 DIAGNOSIS — M79671 Pain in right foot: Secondary | ICD-10-CM

## 2016-05-22 NOTE — Progress Notes (Signed)
CC:  Foot pain  Has seen DR Paulla Dolly after long hikes  Up to 10 miles Developed bilateral foot pain More over lateral column Injections by Dr Paulla Dolly may have helped some  Orthotics have allowed her to do the long hikes without any midfoot or long arch pain That was a real problem before adding those  Past Hx Fusion of distal 4th and 5th toes at MTP joint Now gets some callus formation over this area  ROS No swelling of MTPs No numbness  PE Pleasant F in NAD BP 117/66   Pulse (!) 54   Ht 5\' 5"  (1.651 m)   Wt 165 lb (74.8 kg)   LMP 11/04/1990   BMI 27.46 kg/m   Fusion of distal 4th 5th MT RT Callus hear has been shaved and is not TTP  LT foot also shows some supinatin on stance  No swelling of MTPs  Loss of transverse arch with some hammering of toes 2 and 3 on RT

## 2016-05-23 NOTE — Assessment & Plan Note (Signed)
Less pain on left  May need to make some additional change with lateral posting

## 2016-05-23 NOTE — Assessment & Plan Note (Signed)
I don't thinks she has any changes that clearly warrant surgery  Suggest we continue to modify her orthotics and shoe support to see if that is helpful  Hiking up to 10 miles - may have to cut back as no pain even in hikes of 8 miles today

## 2016-05-31 ENCOUNTER — Ambulatory Visit: Payer: Medicare Other | Admitting: Podiatry

## 2016-06-05 ENCOUNTER — Ambulatory Visit (INDEPENDENT_AMBULATORY_CARE_PROVIDER_SITE_OTHER): Payer: Medicare Other | Admitting: Sports Medicine

## 2016-06-05 ENCOUNTER — Encounter: Payer: Self-pay | Admitting: Sports Medicine

## 2016-06-05 DIAGNOSIS — M79672 Pain in left foot: Secondary | ICD-10-CM | POA: Diagnosis not present

## 2016-06-05 DIAGNOSIS — M79671 Pain in right foot: Secondary | ICD-10-CM | POA: Diagnosis not present

## 2016-06-05 NOTE — Assessment & Plan Note (Signed)
Lateral column post added to forefoot of sports insoles and orthotics  MT pads replaced  Felt more comfortable with walking p this

## 2016-06-05 NOTE — Progress Notes (Signed)
CC: Bilat foot pain  Has pain on outside of both feet Since last visit we tried taping for some support This helped Has hiked up to 10 miles with no severe pain  Comes with orthotics and sports insoles She supinates a lot so we wanted to adjust these  Has had remote surgery to RT foot fusing ray 4 and 5  Pain occurs bilat under 4th MTP  ROS No sensory loss of feet No swelling in feet  PE Gen - pleasnt B F in NAD BP 93/69   Ht 5\' 5"  (1.651 m)   Wt 160 lb (72.6 kg)   LMP 11/04/1990   BMI 26.63 kg/m   Foot shows loss of long arch Loss of transverse arch Lateral column on both sides is rotated Fusion of RT 4/5 toes TTP and callus over plantar surfact MTP 4

## 2016-06-05 NOTE — Assessment & Plan Note (Signed)
We added lateral post to orthotic and sports insoles MT pad replaced  Less pain with standing or walking  OK to cont trying taping as well  Reck pending response

## 2016-06-15 ENCOUNTER — Ambulatory Visit: Payer: Medicare Other | Admitting: Obstetrics & Gynecology

## 2016-06-15 NOTE — Progress Notes (Signed)
66 y.o. G0P0 SingleAfrican AmericanF here for annual exam.  Reports she is doing well.  Having some foot issues over the past year.  Pt would really like to have imaging done on her foot so is seeing a new podiatrist this week.  Since retirement, she's been doing a lot of hiking.  She's taking all sorts of oral treatments.  This seems to be most significant after six or several miles of hiking.  There have been times where it just "kills".  Dr. Oneida Alar suggested an injection but she's not sure that she wants this.  Has upcoming trip to [place] for four day in [place] and then hiking outside [place] for several days.  She's a little concerned about this upcoming trip.    Denies vaginal bleeding.    Hot flashes are better but still has one about ever two hours.    PCP:  Dr. Brigitte Pulse  Patient's last menstrual period was 11/04/1990.          Sexually active: No.  The current method of family planning is post menopausal status.    Exercising: Yes.    walking/ hiking/ rowing machine  Smoker:  no  Health Maintenance: Pap:  02/21/15 neg   12/01/12 Neg. HR HPV:neg  History of abnormal Pap:  no MMG:  03/12/16 BIRADS1:neg  Colonoscopy:  2011 WNL per patient  BMD:   2015 osteopenia per patient  TDaP:  Unsure  Pneumonia vaccine(s):  Yes with PCP  Zostavax:   Yes with PCP  Hep C testing: yes with PCP  Screening Labs: PCP does labs   reports that she has never smoked. She has never used smokeless tobacco. She reports that she drinks about 4.2 oz of alcohol per week . She reports that she does not use drugs.  Past Medical History:  Diagnosis Date  . Chronic constipation   . DI (detrusor instability)   . Diabetes ([place])    pre-diabetes, controlling with diet and exercise  . Femur fracture ([place]) 2011  . Fibroid   . Headache   . Menopausal symptoms   . Non-Hodgkin lymphoma ([place]) 1992   treated with CHOP  . Osteoporosis    h/o 3 stress fractures  . Postmenopausal HRT (hormone replacement therapy)   .  Tennis elbow Right     Past Surgical History:  Procedure Laterality Date  . Biopsy for Non Hodgkins Lymphoma  1992  . DILATION AND CURETTAGE OF UTERUS    . PELVIC LAPAROSCOPY    . TOE SURGERY      Current Outpatient Prescriptions  Medication Sig Dispense Refill  . Calcium Carbonate-Vitamin D (CALCIUM + D PO) Take by mouth.    . Cetirizine HCl (ZYRTEC ALLERGY PO) Take by mouth.    . Cholecalciferol (VITAMIN D PO) Take by mouth.    . citalopram (CELEXA) 20 MG tablet TAKE 1 TABLET (20 MG TOTAL) BY MOUTH DAILY. 30 tablet 5  . Cyanocobalamin (VITAMIN B 12 PO) Take by mouth daily.    . diclofenac sodium (VOLTAREN) 1 % GEL APPLY 2 GRAMS TO ELBOW EVERY 6 HOURS AS NEEDED FOR PAIN  3  . fluticasone (FLONASE) 50 MCG/ACT nasal spray Place into both nostrils daily.     . Multiple Vitamin (MULTIVITAMIN) tablet Take 1 tablet by mouth daily.    . rizatriptan (MAXALT-MLT) 10 MG disintegrating tablet Take by mouth.     No current facility-administered medications for this visit.     Family History  Problem Relation Age of Onset  .  Diabetes Mother 81  . Hypertension Mother   . Alzheimer's disease Mother   . Glaucoma Mother   . Hypertension Father   . Alcoholism Father     deceased age 26  . Cirrhosis Father   . Diabetes Brother   . Hypertension Brother   . Colon cancer Maternal Uncle   . Heart disease Maternal Grandmother   . Breast cancer Cousin     Mat. 1st cousin-Age 29  . Breast cancer Maternal Aunt     Age 49's  . Other Paternal Grandmother     MI    ROS:  Pertinent items are noted in HPI.  Otherwise, a comprehensive ROS was negative.  Exam:   Vitals:   06/18/16 1033  BP: 116/72  Pulse: 72  Resp: 18  Wt:  167  General appearance: alert, cooperative and appears stated age Head: Normocephalic, without obvious abnormality, atraumatic Neck: no adenopathy, supple, symmetrical, trachea midline and thyroid normal to inspection and palpation Lungs: clear to auscultation  bilaterally Breasts: normal appearance, no masses or tenderness Heart: regular rate and rhythm Abdomen: soft, non-tender; bowel sounds normal; no masses,  no organomegaly Extremities: extremities normal, atraumatic, no cyanosis or edema Skin: Skin color, texture, turgor normal. No rashes or lesions Lymph nodes: Cervical, supraclavicular, and axillary nodes normal. No abnormal inguinal nodes palpated Neurologic: Grossly normal   Pelvic: External genitalia:  no lesions              Urethra:  normal appearing urethra with no masses, tenderness or lesions              Bartholins and Skenes: normal                 Vagina: normal appearing vagina with normal color and discharge, no lesions              Cervix: no lesions              Pap taken: No. Bimanual Exam:  Uterus:  normal size, contour, position, consistency, mobility, non-tender              Adnexa: normal adnexa and no mass, fullness, tenderness               Rectovaginal: Confirms               Anus:  normal sphincter tone, no lesions  Chaperone was present for exam.  A:  Well Woman with normal exam PMP, off HRT Still with significant hot flashes H/O HSV, remote H/O large fibroid uterus with calcified fibroids Osteopenia, on Forteo  2 year.  Followed by Dr. Brigitte Pulse.  P:   Mammogram guidelines reviewed pap smear 12/16.  No pap smear obtained today. RF for Valtrex 500mg  dailiy, increase to BID x 3 days as needed.   Trial of gabapentin 100mg  nightly x 7 nights, increase to 200mg  nightly x 7 nights, then 300mg  nightly x 7 nights.  She will call and give update. Rx for citalopram 20mg  daily.  #30/13RF Return annually or prn

## 2016-06-18 ENCOUNTER — Encounter: Payer: Self-pay | Admitting: Obstetrics & Gynecology

## 2016-06-18 ENCOUNTER — Ambulatory Visit (INDEPENDENT_AMBULATORY_CARE_PROVIDER_SITE_OTHER): Payer: Medicare Other | Admitting: Obstetrics & Gynecology

## 2016-06-18 VITALS — BP 116/72 | HR 72 | Resp 18 | Ht 64.5 in | Wt 167.0 lb

## 2016-06-18 DIAGNOSIS — Z01419 Encounter for gynecological examination (general) (routine) without abnormal findings: Secondary | ICD-10-CM | POA: Diagnosis not present

## 2016-06-18 MED ORDER — CITALOPRAM HYDROBROMIDE 20 MG PO TABS: 20.0000 mg | ORAL_TABLET | Freq: Every day | ORAL | 12 refills | Status: DC

## 2016-06-18 MED ORDER — GABAPENTIN 100 MG PO CAPS: ORAL_CAPSULE | ORAL | 0 refills | Status: DC

## 2016-06-18 MED ORDER — VALACYCLOVIR HCL 500 MG PO TABS: ORAL_TABLET | ORAL | 12 refills | Status: DC

## 2016-07-06 ENCOUNTER — Other Ambulatory Visit: Payer: Self-pay | Admitting: Obstetrics & Gynecology

## 2016-07-06 MED ORDER — GABAPENTIN 300 MG PO CAPS: 300.0000 mg | ORAL_CAPSULE | Freq: Every day | ORAL | 12 refills | Status: DC

## 2016-07-06 NOTE — Telephone Encounter (Signed)
Medication refill request: Gabapentin Last AEX:  06/18/16 SM Next AEX: 09/20/17 SM Last MMG (if hormonal medication request): 03/12/16 [facility] Refill authorized: 06/18/16 #[id number]. Please advise. Thank you.   Spoke with patient and she states she still has hot flashes, but they are only about 3-4 hours instead of q hour like they were before. Not as many night sweats and is sleeping better. No suicidal thoughts per patient. She is leaving to go out of the country mid-May and would like to get prescription so she has while she is away.

## 2016-07-06 NOTE — Telephone Encounter (Signed)
Patient calling for refill on gabapentin 300 mg to [facility] on [address] at [phone].

## 2016-11-07 ENCOUNTER — Telehealth: Payer: Self-pay | Admitting: Obstetrics & Gynecology

## 2016-11-07 NOTE — Telephone Encounter (Signed)
Actually, it is ok to take the gabapentin 100mg  twice daily as well as the 300mg  nightly.  See if she wants to have a new prescription with just 100mg  or two separate ones--300mg  and 100mg .  Thanks.

## 2016-11-07 NOTE — Telephone Encounter (Signed)
Patient is still having hot flashes during the day about every two hours. Patient is asking for recommendations on dealing with day time hot flashes.

## 2016-11-07 NOTE — Telephone Encounter (Signed)
Left message to call Laparis Durrett at [phone].  

## 2016-11-07 NOTE — Telephone Encounter (Signed)
Spoke with patient. Patient states she started gabapentin in April for hot flashes, currently taking 300 mg nightly. Helps with hot flashes at night, but continues to experience hot flashes throughout the day every 2 hours. Reports taking citalopram 20mg  daily. Patient asking if gabapentin can be increased to help with hot flashes? Recommended OV for further evaluation and discussion with Dr. Sabra Heck. Patient scheduled for 9/20 at 1:45pm. Patient declined earlier appointments offered. Advised patient would review with Dr. Sabra Heck and return call with any additional recommendations, patient is agreeable.  Routing to provider for final review. Patient is agreeable to disposition. Will close encounter.

## 2016-11-09 ENCOUNTER — Telehealth: Payer: Self-pay | Admitting: Obstetrics & Gynecology

## 2016-11-09 MED ORDER — GABAPENTIN 100 MG PO CAPS: 100.0000 mg | ORAL_CAPSULE | Freq: Two times a day (BID) | ORAL | 12 refills | Status: DC

## 2016-11-09 NOTE — Telephone Encounter (Signed)
Spoke with patient. Patient would like to start gabapentin 100 mg twice daily as well as 300 mg nightly. Would like separate rx for Gabapentin 100 mg twice daily. Rx sent to pharmacy on file Gabapentin 100 mg BID #60 12RF.  Routing to provider for final review. Patient agreeable to disposition. Will close encounter.

## 2016-11-09 NOTE — Telephone Encounter (Signed)
See telephone note dated 11/07/2016.

## 2016-11-09 NOTE — Telephone Encounter (Signed)
Patient states she is returning a call from [place].

## 2016-11-09 NOTE — Addendum Note (Signed)
Addended by: Reesa Chew E on: 11/09/2016 11:21 AM   Modules accepted: Orders

## 2016-11-22 ENCOUNTER — Ambulatory Visit: Payer: Self-pay | Admitting: Obstetrics & Gynecology

## 2016-11-22 ENCOUNTER — Ambulatory Visit: Payer: Medicare Other | Admitting: Sports Medicine

## 2016-11-27 ENCOUNTER — Ambulatory Visit (INDEPENDENT_AMBULATORY_CARE_PROVIDER_SITE_OTHER): Payer: Medicare Other | Admitting: Obstetrics & Gynecology

## 2016-11-27 ENCOUNTER — Encounter: Payer: Self-pay | Admitting: Obstetrics & Gynecology

## 2016-11-27 VITALS — BP 128/68 | HR 76 | Resp 16

## 2016-11-27 DIAGNOSIS — R232 Flushing: Secondary | ICD-10-CM

## 2016-11-27 MED ORDER — GABAPENTIN 100 MG PO CAPS: ORAL_CAPSULE | ORAL | 3 refills | Status: DC

## 2016-11-27 NOTE — Progress Notes (Signed)
GYNECOLOGY  VISIT  CC:   Hot flashes  HPI: 66 y.o. G0P0 Single African American female here for discussion of hot flashes.  She reports she's been having increased issues with her feet with plantar fasciitis.  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  Does think the gabapentin has really helped with overall foot pain issues.  Taking 300mg  nightly and this has really helped.  Having one hot flash at night.  She has added 100mg  twice in the day but her daytime hot flashes have not really changed.  Still having a hot flashes during the day.  Would like to consider options.  GYNECOLOGIC HISTORY: Patient's last menstrual period was 11/04/1990. Contraception:  Post menopausal  Menopausal hormone therapy: none  Patient Active Problem List   Diagnosis Date Noted  . Abnormality of gait 01/25/2016  . Left foot pain 01/01/2016  . Foot pain, right 01/01/2016  . Callus of foot 01/01/2016  . Headache, migraine 02/10/2014  . Osteoporosis   . DI (detrusor instability)   . Fibroid   . Menopausal symptoms   . Non-Hodgkin lymphoma [facility])     Past Medical History:  Diagnosis Date  . Chronic constipation   . DI (detrusor instability)   . Diabetes ([place])    pre-diabetes, controlling with diet and exercise  . Femur fracture ([place]) 2011  . Fibroid   . Headache   . Menopausal symptoms   . Non-Hodgkin lymphoma ([place]) 1992   treated with CHOP  . Osteoporosis    h/o 3 stress fractures  . Postmenopausal HRT (hormone replacement therapy)   . Tennis elbow Right     Past Surgical History:  Procedure Laterality Date  . Biopsy for Non Hodgkins Lymphoma  1992  . DILATION AND CURETTAGE OF UTERUS    . PELVIC LAPAROSCOPY    . TOE SURGERY      MEDS:   Current Outpatient Prescriptions on File Prior to Visit  Medication Sig Dispense Refill  . Calcium Carbonate-Vitamin D (CALCIUM + D PO) Take by mouth.    . Cetirizine  HCl (ZYRTEC ALLERGY PO) Take by mouth.    . Cholecalciferol (VITAMIN D PO) Take by mouth.    . citalopram (CELEXA) 20 MG tablet Take 1 tablet (20 mg total) by mouth daily. 30 tablet 12  . Cyanocobalamin (VITAMIN B 12 PO) Take by mouth daily.    . diclofenac sodium (VOLTAREN) 1 % GEL APPLY 2 GRAMS TO ELBOW EVERY 6 HOURS AS NEEDED FOR PAIN  3  . fluticasone (FLONASE) 50 MCG/ACT nasal spray Place into both nostrils daily.     Marland Kitchen gabapentin (NEURONTIN) 100 MG capsule Take 1 capsule (100 mg total) by mouth 2 (two) times daily. Will also continue taking separate rx gabapentin 300 mg at bedtime. 60 capsule 12  . gabapentin (NEURONTIN) 300 MG capsule Take 1 capsule (300 mg total) by mouth at bedtime. Take 1 tab nightly 30 capsule 12  . Multiple Vitamin (MULTIVITAMIN) tablet Take 1 tablet by mouth daily.    . rizatriptan (MAXALT-MLT) 10 MG disintegrating tablet Take by mouth.    . valACYclovir (VALTREX) 500 MG tablet 1 tablet po QD.  Increase to BID x 3 days with symptoms. 30 tablet 12   No current facility-administered medications on file prior to visit.     ALLERGIES: Contrast media [iodinated diagnostic agents]  Family History  Problem Relation Age of Onset  . Diabetes  Mother 41  . Hypertension Mother   . Alzheimer's disease Mother   . Glaucoma Mother   . Hypertension Father   . Alcoholism Father        deceased age 52  . Cirrhosis Father   . Diabetes Brother   . Hypertension Brother   . Colon cancer Maternal Uncle   . Heart disease Maternal Grandmother   . Breast cancer Cousin        Mat. 1st cousin-Age 4  . Breast cancer Maternal Aunt        Age 62's  . Other Paternal Grandmother        MI    SH:  Single, non-smoker  Review of Systems  All other systems reviewed and are negative.   PHYSICAL EXAMINATION:    BP 130/76 (BP Location: Right Arm, Patient Position: Sitting, Cuff Size: Large)   Pulse 76   Resp 16   LMP 11/04/1990     General appearance: alert, cooperative and  appears stated age   Assessment: Hot flashes  Plan: Gabapentin 300mg  nightly and increase Gabapentin to 200mg  twice daily.  Will change rx to 100mg  capsules.  #210/3RF.  Pt is going to give update in two to three weeks.   ~15 minutes spent with patient >50% of time was in face to face discussion of above.

## 2016-11-29 ENCOUNTER — Encounter: Payer: Self-pay | Admitting: Sports Medicine

## 2016-11-29 ENCOUNTER — Ambulatory Visit (INDEPENDENT_AMBULATORY_CARE_PROVIDER_SITE_OTHER): Payer: Medicare Other | Admitting: Sports Medicine

## 2016-11-29 DIAGNOSIS — R269 Unspecified abnormalities of gait and mobility: Secondary | ICD-10-CM

## 2016-11-29 DIAGNOSIS — M79671 Pain in right foot: Secondary | ICD-10-CM | POA: Diagnosis not present

## 2016-11-29 NOTE — Assessment & Plan Note (Signed)
We added new MT pads to the orthotics today  She is doing well with these and now back to extensive hiking

## 2016-11-29 NOTE — Assessment & Plan Note (Signed)
Improved with orthotic support  S/p recent injection that resolved PF sxs

## 2016-11-29 NOTE — Progress Notes (Signed)
[facility] [address] [place], [place] [address] Phone: [phone] Fax: [phone]   Patient Name: Deborah Fisher Date of Birth: Jun 28, 1950 Medical Record Number: [id number] Gender: female Date of Encounter: 11/29/2016  History of Present Illness:  Deborah Fisher is a 66 y.o. very pleasant female patient who presents with the following:   Follow up of bilateral foot pain. Patient was last seen 6 months ago. She is an avid hiker (up to 12 miles per hike) and has done well with her custom orthotics. She also has bilateral plantar fascitis that is currently under good control, s/p cortisone injection in her R heel per her podiatrist. She is compliant with her stretching exercises and night splints. She has recently started back hiking after her cortisone injection last month and is here for re-evaluation of her orthotics.   Patient Active Problem List   Diagnosis Date Noted  . Abnormality of gait 01/25/2016  . Left foot pain 01/01/2016  . Foot pain, right 01/01/2016  . Callus of foot 01/01/2016  . Headache, migraine 02/10/2014  . Osteoporosis   . DI (detrusor instability)   . Fibroid   . Menopausal symptoms   . Non-Hodgkin lymphoma [facility])    Past Medical History:  Diagnosis Date  . Chronic constipation   . DI (detrusor instability)   . Diabetes ([place])    pre-diabetes, controlling with diet and exercise  . Femur fracture ([place]) 2011  . Fibroid   . Headache   . Menopausal symptoms   . Non-Hodgkin lymphoma ([place]) 1992   treated with CHOP  . Osteoporosis    h/o 3 stress fractures  . Postmenopausal HRT (hormone replacement therapy)   . Tennis elbow Right    Past Surgical History:  Procedure Laterality Date  . Biopsy for Non Hodgkins Lymphoma  1992  . DILATION AND CURETTAGE OF UTERUS    . PELVIC LAPAROSCOPY    . TOE SURGERY     Social History  Substance Use Topics  . Smoking status: Never Smoker  . Smokeless tobacco: Never Used  .  Alcohol use 4.2 oz/week    7 Standard drinks or equivalent per week     Comment: 1 glass of red wine a night   Family History  Problem Relation Age of Onset  . Diabetes Mother 57  . Hypertension Mother   . Alzheimer's disease Mother   . Glaucoma Mother   . Hypertension Father   . Alcoholism Father        deceased age 15  . Cirrhosis Father   . Diabetes Brother   . Hypertension Brother   . Colon cancer Maternal Uncle   . Heart disease Maternal Grandmother   . Breast cancer Cousin        Mat. 1st cousin-Age 25  . Breast cancer Maternal Aunt        Age 70's  . Other Paternal Grandmother        MI   Allergies  Allergen Reactions  . Contrast Media [Iodinated Diagnostic Agents]     Warm feeling once    Medication list has been reviewed and updated.  Prior to Admission medications   Medication Sig Start Date End Date Taking? Authorizing Provider  Calcium Carbonate-Vitamin D (CALCIUM + D PO) Take by mouth.    [provider]  Cetirizine HCl (ZYRTEC ALLERGY PO) Take by mouth.    [provider]  Cholecalciferol (VITAMIN D PO) Take by mouth.    [provider]  citalopram (  CELEXA) 20 MG tablet Take 1 tablet (20 mg total) by mouth daily. 06/18/16   Megan Salon, MD  Cyanocobalamin (VITAMIN B 12 PO) Take by mouth daily.    [provider]  diclofenac sodium (VOLTAREN) 1 % GEL APPLY 2 GRAMS TO ELBOW EVERY 6 HOURS AS NEEDED FOR PAIN 02/04/15   [provider]  fluticasone (FLONASE) 50 MCG/ACT nasal spray Place into both nostrils daily.  11/04/12   [provider]  gabapentin (NEURONTIN) 100 MG capsule 300mg  nightly and 200mg  twice during the day.  Will also continue taking separate rx gabapentin 300 mg at bedtime. 11/27/16   Megan Salon, MD  Multiple Vitamin (MULTIVITAMIN) tablet Take 1 tablet by mouth daily.    [provider]  rizatriptan (MAXALT-MLT) 10 MG disintegrating tablet Take by mouth. 03/12/14   [provider]  valACYclovir (VALTREX) 500 MG tablet 1 tablet po QD.  Increase to BID x 3 days with symptoms. 06/18/16   Megan Salon, MD    Review of Systems:  Negative aside from HPI.   Physical Examination: Vitals:   11/29/16 1117  BP: 106/60   Vitals:   11/29/16 1117  Weight: 165 lb (74.8 kg)  Height: 5\' 4"  (1.626 m)   Body mass index is 28.32 kg/m.  Physical Exam Constitutional: NAD, appears comfortable Bilateral feet with some supination and loss of arch on standing Strength and ROM intact  Fusion of right 4/5 toes   Note orthotic correction looks good with standing Flattening of MT pads  Assessment and Plan:  Metatarsalgia: Exacerbated by long (10+ mile) hikes. Improved with her custom orthotics that were made in office on 01/25/2016. Her metatarsal pads were replaced today on her orthotics which are otherwise in great shape.  -- Continue with custom orthotics while hiking -- Discussed finding shoes with good metatarsals support -- Provided handout with heel-drop exercises for achilles support  -- Follow up as needed  Velna Ochs, MD   I observed and examined the patient with the resident and agree with assessment and plan.  Note reviewed and modified by me. Stefanie Libel, MD

## 2017-03-26 ENCOUNTER — Other Ambulatory Visit: Payer: Self-pay | Admitting: Internal Medicine

## 2017-03-26 DIAGNOSIS — Z1231 Encounter for screening mammogram for malignant neoplasm of breast: Secondary | ICD-10-CM

## 2017-04-17 ENCOUNTER — Ambulatory Visit
Admission: RE | Admit: 2017-04-17 | Discharge: 2017-04-17 | Disposition: A | Discharge status: Home / Self Care | Payer: Medicare Other | Source: Ambulatory Visit | Attending: Internal Medicine | Admitting: Internal Medicine

## 2017-04-17 DIAGNOSIS — Z1231 Encounter for screening mammogram for malignant neoplasm of breast: Secondary | ICD-10-CM

## 2017-05-01 ENCOUNTER — Other Ambulatory Visit: Payer: Self-pay | Admitting: Obstetrics & Gynecology

## 2017-05-01 NOTE — Telephone Encounter (Signed)
Medication refill request: gabapentin  Last AEX:  06-18-16  Next AEX: 09-20-16  Last MMG (if hormonal medication request): 04-17-17  Refill authorized: please advise

## 2017-06-19 ENCOUNTER — Other Ambulatory Visit: Payer: Self-pay | Admitting: Obstetrics & Gynecology

## 2017-06-19 NOTE — Telephone Encounter (Signed)
Medication refill request: Celexa Last AEX:  06-18-16  Next AEX: 09-20-17 Last MMG (if hormonal medication request): 04-17-17 WNL  Refill authorized: please advise

## 2017-08-30 ENCOUNTER — Other Ambulatory Visit: Payer: Self-pay | Admitting: Obstetrics & Gynecology

## 2017-08-30 DIAGNOSIS — R232 Flushing: Secondary | ICD-10-CM

## 2017-08-30 NOTE — Telephone Encounter (Signed)
Medication refill request: gabapentin 100mg  Last AEX:  06-18-16 Next AEX: 09-20-17 Last MMG (if hormonal medication request): 04-17-17 category c density birads 1:neg Refill authorized: rx last refilled 05-01-17. Please approve until aex if appropriate

## 2017-09-20 ENCOUNTER — Other Ambulatory Visit (HOSPITAL_COMMUNITY)
Admission: RE | Admit: 2017-09-20 | Discharge: 2017-09-20 | Disposition: A | Discharge status: Home / Self Care | Payer: Medicare Other | Source: Ambulatory Visit | Attending: Obstetrics & Gynecology | Admitting: Obstetrics & Gynecology

## 2017-09-20 ENCOUNTER — Encounter: Payer: Self-pay | Admitting: Obstetrics & Gynecology

## 2017-09-20 ENCOUNTER — Other Ambulatory Visit: Payer: Self-pay

## 2017-09-20 ENCOUNTER — Ambulatory Visit (INDEPENDENT_AMBULATORY_CARE_PROVIDER_SITE_OTHER): Payer: Medicare Other | Admitting: Obstetrics & Gynecology

## 2017-09-20 VITALS — BP 110/70 | HR 60 | Resp 18 | Ht 64.5 in | Wt 169.2 lb

## 2017-09-20 DIAGNOSIS — Z124 Encounter for screening for malignant neoplasm of cervix: Secondary | ICD-10-CM

## 2017-09-20 DIAGNOSIS — Z01419 Encounter for gynecological examination (general) (routine) without abnormal findings: Secondary | ICD-10-CM

## 2017-09-20 MED ORDER — VALACYCLOVIR HCL 500 MG PO TABS: ORAL_TABLET | ORAL | 4 refills | Status: DC

## 2017-09-20 MED ORDER — GABAPENTIN 100 MG PO CAPS: ORAL_CAPSULE | ORAL | 4 refills | Status: DC

## 2017-09-20 MED ORDER — CITALOPRAM HYDROBROMIDE 20 MG PO TABS: 20.0000 mg | ORAL_TABLET | Freq: Every day | ORAL | 4 refills | Status: DC

## 2017-09-20 NOTE — Progress Notes (Signed)
67 y.o. G0P0 SingleAfrican AmericanF here for annual exam.  Doing well.  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  So she has decided to come out of retirement.  Denies vaginal bleeding.  Hot flashes are under better control.    Patient's last menstrual period was 11/04/1990.          Sexually active: No.  The current method of family planning is post menopausal status.    Exercising: Yes.    walking, hiking, rowing machine  Smoker:  no  Health Maintenance: Pap:  02/21/15 Neg   12/01/12 Neg. HR HPV:Neg  History of abnormal Pap:  no MMG:  04/17/17 BIRADS1:Neg  Colonoscopy:  02/22/10 f/u 10 years  BMD:   2015 TDaP:  Current with PCP Pneumonia vaccine(s):  PCP Shingrix:   No.  Will get this in the future.  Hasn't found it due to it being back ordered. Hep C testing: PCP Screening Labs: PCP   reports that she has never smoked. She has never used smokeless tobacco. She reports that she drinks about 1.2 oz of alcohol per week. She reports that she does not use drugs.  Past Medical History:  Diagnosis Date  . Bursitis    left hip   . Chronic constipation   . DI (detrusor instability)   . Diabetes ([place])    pre-diabetes, controlling with diet and exercise  . Femur fracture ([place]) 2011  . Fibroid   . Headache   . Menopausal symptoms   . Non-Hodgkin lymphoma ([place]) 1992   treated with CHOP  . Osteoporosis    h/o 3 stress fractures  . Postmenopausal HRT (hormone replacement therapy)   . Tennis elbow Right   . Trigger finger    bilateral     Past Surgical History:  Procedure Laterality Date  . Biopsy for Non Hodgkins Lymphoma  1992  . DILATION AND CURETTAGE OF UTERUS    . PELVIC LAPAROSCOPY    . TOE SURGERY      Current Outpatient Medications  Medication Sig Dispense Refill  . Calcium Carbonate-Vitamin D (CALCIUM + D PO) Take by mouth.    . Cetirizine HCl (ZYRTEC ALLERGY PO) Take by mouth.    . Cholecalciferol (VITAMIN D PO) Take by mouth.     . citalopram (CELEXA) 20 MG tablet TAKE 1 TABLET BY MOUTH EVERY DAY 30 tablet 8  . Cyanocobalamin (VITAMIN B 12 PO) Take by mouth daily.    . diclofenac sodium (VOLTAREN) 1 % GEL APPLY 2 GRAMS TO ELBOW EVERY 6 HOURS AS NEEDED FOR PAIN  3  . fluticasone (FLONASE) 50 MCG/ACT nasal spray Place into both nostrils daily.     Marland Kitchen gabapentin (NEURONTIN) 100 MG capsule TAKE 3 CAPSULES NIGHTLY AND 2 CAPSULES TWICE DURING THE DAY. CONTINUE TAKING SEPERATE RX 210 capsule 3  . Multiple Vitamin (MULTIVITAMIN) tablet Take 1 tablet by mouth daily.    . rizatriptan (MAXALT-MLT) 10 MG disintegrating tablet Take by mouth.    . valACYclovir (VALTREX) 500 MG tablet 1 tablet po QD.  Increase to BID x 3 days with symptoms. 30 tablet 12   No current facility-administered medications for this visit.     Family History  Problem Relation Age of Onset  . Diabetes Mother 66  . Hypertension Mother   . Alzheimer's disease Mother   . Glaucoma Mother   . Hypertension Father   . Alcoholism Father        deceased age 59  .  Cirrhosis Father   . Diabetes Brother   . Hypertension Brother   . Colon cancer Maternal Uncle   . Heart disease Maternal Grandmother   . Breast cancer Cousin        Mat. 1st cousin-Age 43  . Breast cancer Maternal Aunt        Age 75's  . Other Paternal Grandmother        MI    Review of Systems  Skin:       Hair loss   All other systems reviewed and are negative.   Exam:   BP 110/70 (BP Location: Right Arm, Patient Position: Sitting, Cuff Size: Normal)   Pulse 60   Resp 18   Ht 5' 4.5" (1.638 m)   Wt 169 lb 3.2 oz (76.7 kg)   LMP 11/04/1990   BMI 28.59 kg/m     Height: 5' 4.5" (163.8 cm)  Ht Readings from Last 3 Encounters:  09/20/17 5' 4.5" (1.638 m)  11/29/16 5\' 4"  (1.626 m)  06/18/16 5' 4.5" (1.638 m)    General appearance: alert, cooperative and appears stated age Head: Normocephalic, without obvious abnormality, atraumatic Neck: no adenopathy, supple, symmetrical,  trachea midline and thyroid normal to inspection and palpation Lungs: clear to auscultation bilaterally Breasts: normal appearance, no masses or tenderness Heart: regular rate and rhythm Abdomen: soft, non-tender; bowel sounds normal; no masses,  no organomegaly Extremities: extremities normal, atraumatic, no cyanosis or edema Skin: Skin color, texture, turgor normal. No rashes or lesions Lymph nodes: Cervical, supraclavicular, and axillary nodes normal. No abnormal inguinal nodes palpated Neurologic: Grossly normal   Pelvic: External genitalia:  no lesions              Urethra:  normal appearing urethra with no masses, tenderness or lesions              Bartholins and Skenes: normal                 Vagina: normal appearing vagina with normal color and discharge, no lesions              Cervix: no lesions              Pap taken: Yes.   Bimanual Exam:  Uterus: about 8 weeks in size, fibroids present              Adnexa: normal adnexa and no mass, fullness, tenderness               Rectovaginal: Confirms               Anus:  normal sphincter tone, no lesions  Chaperone was present for exam.  A:  Well Woman with normal exam PMP, no HRT Hot flashes H/O remote hx of HSV H/O fibroid uterus with calcified fibroids Osteopenia, on Forteo.  Followed by Dr. Brigitte Pulse.  P:   Mammogram guidelines reviewed pap smear obtained today RF for Gabapentin 200mg  q am and then 200mg  q afternoon, and then 300mg  qhs.  90 day supply to pharmacy. Valtrex 500mg  qday.  #90/4RF RF for Citalopram 20mg  daily.  #90/4RF Return annually or prn

## 2017-09-23 LAB — CYTOLOGY - PAP: Diagnosis: NEGATIVE

## 2017-10-07 ENCOUNTER — Telehealth: Payer: Self-pay | Admitting: Obstetrics & Gynecology

## 2017-10-07 NOTE — Telephone Encounter (Signed)
Patient would to know if we would recommend black cohosh for hot flash symptoms. If we do she would like to how much she should take and for how long. Please advise.

## 2017-10-07 NOTE — Telephone Encounter (Signed)
Message left to return call to Triage Nurse at [phone].    

## 2017-10-10 ENCOUNTER — Encounter: Payer: Self-pay | Admitting: Cardiology

## 2017-10-10 ENCOUNTER — Ambulatory Visit: Payer: Medicare Other | Admitting: Cardiology

## 2017-10-10 VITALS — BP 112/64 | HR 66 | Ht 65.0 in | Wt 170.8 lb

## 2017-10-10 DIAGNOSIS — I209 Angina pectoris, unspecified: Secondary | ICD-10-CM

## 2017-10-10 DIAGNOSIS — E785 Hyperlipidemia, unspecified: Secondary | ICD-10-CM

## 2017-10-10 DIAGNOSIS — Z8579 Personal history of other malignant neoplasms of lymphoid, hematopoietic and related tissues: Secondary | ICD-10-CM | POA: Diagnosis not present

## 2017-10-10 MED ORDER — PREDNISONE 50 MG PO TABS: ORAL_TABLET | ORAL | 0 refills | Status: DC

## 2017-10-10 MED ORDER — ASPIRIN 81 MG PO TABS: 81.0000 mg | ORAL_TABLET | Freq: Every day | ORAL | 3 refills | Status: DC

## 2017-10-10 MED ORDER — METOPROLOL TARTRATE 50 MG PO TABS: 50.0000 mg | ORAL_TABLET | Freq: Once | ORAL | 0 refills | Status: DC

## 2017-10-10 NOTE — Patient Instructions (Addendum)
Medication Instructions:  Your physician has recommended you make the following change in your medication:   Start: Aspirin 81 mg daily  Labwork: NONE  Testing/Procedures: Please arrive at the [facility] main entrance of [facility] at xx:xx AM (30-45 minutes prior to test start time)  [address] [facility] [place],  [address] [phone]  Proceed to the [facility] Radiology Department (First Floor).  Please follow these instructions carefully (unless otherwise directed):  Hold all erectile dysfunction medications at least 48 hours prior to test.  On the Night Before the Test: . Drink plenty of water. . Do not consume any caffeinated/decaffeinated beverages or chocolate 12 hours prior to your test. . Do not take any antihistamines 12 hours prior to your test. . If the patient has contrast allergy: ? Patient will need a prescription for Prednisone and very clear instructions (as follows): 1. Prednisone 50 mg - take 13 hours prior to test 2. Take another Prednisone 50 mg 7 hours prior to test 3. Take another Prednisone 50 mg 1 hour prior to test 4. Take Benadryl 50 mg 1 hour prior to test . Patient must complete all four doses of above prophylactic medications. . Patient will need a ride after test due to Benadryl.  On the Day of the Test: . Drink plenty of water. Do not drink any water within one hour of the test. . Do not eat any food 4 hours prior to the test. . You may take your regular medications prior to the test. . IF NOT ON A BETA BLOCKER - Take 50 mg of lopressor (metoprolol) one hour before the test.   After the Test: . Drink plenty of water. . After receiving IV contrast, you may experience a mild flushed feeling. This is normal. . On occasion, you may experience a mild rash up to 24 hours after the test. This is not dangerous. If this occurs, you can take Benadryl 25 mg and increase your fluid intake. . If you experience  trouble breathing, this can be serious. If it is severe call 911 IMMEDIATELY. If it is mild, please call our office.  Any Other Special Instructions Will Be Listed Below (If Applicable).  Follow up: Your physician recommends that you schedule a follow-up appointment in: 6 weeks.      If you need a refill on your cardiac medications before your next appointment, please call your pharmacy.

## 2017-10-10 NOTE — Progress Notes (Signed)
Cardiology Office Note:    Date:  10/10/2017   ID:  Deborah Fisher, DOB 1950/05/18, MRN [id number]  PCP:  Marton Redwood, MD  Cardiologist:  Shirlee More, MD   Referring MD: Marton Redwood, MD  ASSESSMENT:    1. Angina pectoris ([place])   2. History of lymphoma   3. Hyperlipidemia, unspecified hyperlipidemia type    PLAN:    In order of problems listed above:  1. Her symptoms are exertional relieved with rest but atypical without chest pain.  We discussed further evaluation of stable chest pain syndrome and after a discussion she will undergo cardiac CTA.  If abnormal fractional flow reserve and decisions about appropriateness of medical therapy revascularization.  Also if abnormal she would benefit from statin therapy and agrees to take it. 2. At increased cardiovascular risk long-term survivors of lymphoma.  I asked her to start taking aspirin 81 mg daily 3. She has hyperlipidemia she has hyper HDL but her non-HDL is elevated and if she has evidence of coronary artery disease would benefit from statin therapy and agrees to take it  Next appointment   Medication Adjustments/Labs and Tests Ordered: Current medicines are reviewed at length with the patient today.  Concerns regarding medicines are outlined above.  Orders Placed This Encounter  Procedures  . CT CORONARY MORPH W/CTA COR W/SCORE W/CA W/CM &/OR WO/CM  . CT CORONARY FRACTIONAL FLOW RESERVE DATA PREP  . CT CORONARY FRACTIONAL FLOW RESERVE FLUID ANALYSIS  . EKG 12-Lead   Meds ordered this encounter  Medications  . aspirin 81 MG tablet    Sig: Take 1 tablet (81 mg total) by mouth daily.    Dispense:  30 tablet    Refill:  3     Chief Complaint  Patient presents with  . Chest Pain    History of Present Illness:    Deborah Fisher is a 67 y.o. female who is being seen today for the evaluation of angina at the request of Marton Redwood, MD.  Since March she has had a pattern of stable exertional aching in the left jaw  when she walks in the morning climbing hills which goes away as she goes downhill within 3 to 5 minutes.  This pattern has not progressed in anything is become less prominent and some days does not occur.  She also has had episodes of pressure in her chest in the past and emotionally stressful situations nonexertional.  She also complains intermittently of nocturnal indigestion with chest discomfort quickly relieved with antiacid.  She is referred for cardiac evaluation.  She has no history of congenital rheumatic heart disease no shortness of breath no palpitation or syncope but tells me decades ago she had a rapid heart rhythm associated with high caffeine intake.  Her cardiovascular risk factors and with the for hyperlipidemia however she has hyper HDL and she had chemotherapy for non-Hodgkin's lymphoma. Past Medical History:  Diagnosis Date  . Bursitis    left hip   . Chronic constipation   . DI (detrusor instability)   . Diabetes ([place])    pre-diabetes, controlling with diet and exercise  . Femur fracture ([place]) 2011  . Fibroid   . Headache   . Menopausal symptoms   . Non-Hodgkin lymphoma ([place]) 1992   treated with CHOP  . Osteoporosis    h/o 3 stress fractures  . Postmenopausal HRT (hormone replacement therapy)   . Tennis elbow Right   . Trigger finger    bilateral  Past Surgical History:  Procedure Laterality Date  . Biopsy for Non Hodgkins Lymphoma  1992  . DILATION AND CURETTAGE OF UTERUS    . PELVIC LAPAROSCOPY    . TOE SURGERY      Current Medications: Current Meds  Medication Sig  . Calcium Carbonate-Vitamin D (CALCIUM + D PO) Take by mouth.  . Cetirizine HCl (ZYRTEC ALLERGY PO) Take by mouth.  . Cholecalciferol (VITAMIN D PO) Take by mouth.  . citalopram (CELEXA) 20 MG tablet Take 1 tablet (20 mg total) by mouth daily.  . Cyanocobalamin (VITAMIN B 12 PO) Take by mouth daily.  . diclofenac sodium (VOLTAREN) 1 % GEL APPLY 2 GRAMS TO ELBOW EVERY 6 HOURS AS NEEDED FOR  PAIN  . gabapentin (NEURONTIN) 100 MG capsule Take 2 tabs in the am, 2 tabs in the afternoon, and then three at night.  . Multiple Vitamin (MULTIVITAMIN) tablet Take 1 tablet by mouth daily.  . rizatriptan (MAXALT-MLT) 10 MG disintegrating tablet Take by mouth.     Allergies:   Contrast media [iodinated diagnostic agents]   Social History   Socioeconomic History  . Marital status: Single    Spouse name: Not on file  . Number of children: 0  . Years of education: PhD  . 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 Financial resource strain: Not on file  . Food insecurity:    Worry: Not on file    Inability: Not on file  . Transportation needs:    Medical: Not on file    Non-medical: Not on file  Tobacco Use  . Smoking status: Never Smoker  . Smokeless tobacco: Never Used  Substance and Sexual Activity  . Alcohol use: Yes    Alcohol/week: 2.0 standard drinks    Types: 2 Glasses of wine per week  . Drug use: No  . Sexual activity: Not Currently    Partners: Male    Birth control/protection: Post-menopausal  Lifestyle  . Physical activity:    Days per week: Not on file    Minutes per session: Not on file  . Stress: Not on file  Relationships  . Social connections:    Talks on phone: Not on file    Gets together: Not on file    Attends religious service: Not on file    Active member of club or organization: Not on file    Attends meetings of clubs or organizations: Not on file    Relationship status: Not on file  Other Topics Concern  . Not on file  Social History Narrative  . Not on file     Family History: The patient's family history includes Alcoholism in her father; Alzheimer's disease in her mother; Breast cancer in her cousin and maternal aunt; Cirrhosis in her father; Colon cancer in her maternal uncle; Diabetes in her brother; Diabetes (age of onset: 46) in her mother;  Glaucoma in her mother; Heart disease in her maternal aunt, maternal grandmother, and mother; Hypertension in her brother, father, and mother; Other in her paternal grandmother.  ROS:   Review of Systems  Constitution: Negative.  HENT: Negative.   Cardiovascular: Positive for chest pain.  Respiratory: Negative.   Endocrine: Negative.   Hematologic/Lymphatic: Negative.   Skin: Negative.   Musculoskeletal: Negative.   Gastrointestinal: Negative.   Genitourinary: Negative.   Neurological: Positive for dizziness.  Psychiatric/Behavioral: Negative.  Allergic/Immunologic: Negative.    Please see the history of present illness.     All other systems reviewed and are negative.  EKGs/Labs/Other Studies Reviewed:    The following studies were reviewed today:   EKG:  EKG is  ordered today.  The ekg ordered today demonstrates [place] first degree AVB OW normal  Recent Labs:  CBC CMP are normal No results found for requested labs within last 8760 hours.  Recent Lipid Panel   Chol 263, LDL 136 HDL 110 No results found for: CHOL, TRIG, HDL, CHOLHDL, VLDL, LDLCALC, LDLDIRECT  Physical Exam:    VS:  BP 112/64 (BP Location: Left Arm)   Pulse 66   Ht 5\' 5"  (1.651 m)   Wt 170 lb 12.8 oz (77.5 kg)   LMP 11/04/1990   SpO2 98%   BMI 28.42 kg/m     Wt Readings from Last 3 Encounters:  10/10/17 170 lb 12.8 oz (77.5 kg)  09/20/17 169 lb 3.2 oz (76.7 kg)  11/29/16 165 lb (74.8 kg)     GEN:  Well nourished, well developed in no acute distress HEENT: Normal NECK: No JVD; No carotid bruits LYMPHATICS: No lymphadenopathy CARDIAC: RRR, no murmurs, rubs, gallops RESPIRATORY:  Clear to auscultation without rales, wheezing or rhonchi  ABDOMEN: Soft, non-tender, non-distended MUSCULOSKELETAL:  No edema; No deformity  SKIN: Warm and dry NEUROLOGIC:  Alert and oriented x 3 PSYCHIATRIC:  Normal affect     Signed, Shirlee More, MD  10/10/2017 4:47 PM    [place]

## 2017-10-24 ENCOUNTER — Telehealth: Payer: Self-pay | Admitting: Cardiology

## 2017-10-24 NOTE — Telephone Encounter (Signed)
Do you have anything in your staff messages about this? I'm also going to send this to [place], since I think you may have been out.

## 2017-10-24 NOTE — Telephone Encounter (Signed)
New message    Patient calling checking on the status of upcoming procedure .   Patient stated she has not heard when the upcoming angiogram will be done. Seen Dr. Bettina Gavia as a new patient 10/10/17.  Patient is asking for a call back today

## 2017-10-24 NOTE — Telephone Encounter (Signed)
Left message on patient's cell phone per DPR explaining that I spoke with the cardiac CT scheduler and she informed me that she would be contacting this patient this afternoon to schedule testing. Advised patient to contact our office with further questions or concerns.

## 2017-10-30 ENCOUNTER — Telehealth: Payer: Self-pay

## 2017-10-30 DIAGNOSIS — Z01812 Encounter for preprocedural laboratory examination: Secondary | ICD-10-CM

## 2017-10-30 DIAGNOSIS — I209 Angina pectoris, unspecified: Secondary | ICD-10-CM

## 2017-10-30 NOTE — Telephone Encounter (Signed)
Patient aware that she will have cardiac CTA on 11-20-17, and will report to office on 11-14-17 for BMP.  Patient verbalized understanding and agrees to plan.

## 2017-10-31 ENCOUNTER — Telehealth: Payer: Self-pay | Admitting: *Deleted

## 2017-10-31 NOTE — Telephone Encounter (Addendum)
Patient reports jaw pain with exertion for 6 months. Has a cardiac ct shceudled on 9/18. Patient reports left sided "wringing" constant chest pain for 4 days. Advised patient to seek emergency treatment if having active chest pain. Denies any shortness of breath or nausea. Will consult with Dr. Geraldo Pitter.

## 2017-10-31 NOTE — Telephone Encounter (Signed)
Per Dr. Geraldo Pitter patient advised to go to the emergency department via amublance. Patient verbally understands.

## 2017-10-31 NOTE — Telephone Encounter (Signed)
Pt states having constant wringing out feeling in Lt side of chest and some jaw pain with exersion onset 6 months. Wrenching feeling in chest onset 4 days. Appt for Angiogram at [facility] on 9/18. Informed pt will send this to Dr. Joya Gaskins nurse and to please go to the ED if gets more intense. Please advise pt

## 2017-11-01 ENCOUNTER — Emergency Department (HOSPITAL_COMMUNITY): Payer: Medicare Other

## 2017-11-01 ENCOUNTER — Emergency Department (HOSPITAL_COMMUNITY)
Admission: EM | Admit: 2017-11-01 | Discharge: 2017-11-02 | Disposition: A | Discharge status: Home / Self Care | Payer: Medicare Other | Attending: Emergency Medicine | Admitting: Emergency Medicine

## 2017-11-01 ENCOUNTER — Other Ambulatory Visit: Payer: Self-pay

## 2017-11-01 DIAGNOSIS — Z79899 Other long term (current) drug therapy: Secondary | ICD-10-CM | POA: Insufficient documentation

## 2017-11-01 DIAGNOSIS — R072 Precordial pain: Secondary | ICD-10-CM | POA: Diagnosis not present

## 2017-11-01 DIAGNOSIS — Z7982 Long term (current) use of aspirin: Secondary | ICD-10-CM | POA: Insufficient documentation

## 2017-11-01 LAB — BASIC METABOLIC PANEL
ANION GAP: 7 (ref 5–15)
BUN: 17 mg/dL (ref 8–23)
CO2: 29 mmol/L (ref 22–32)
Calcium: 10 mg/dL (ref 8.9–10.3)
Chloride: 102 mmol/L (ref 98–111)
Creatinine, Ser: 1.01 mg/dL — ABNORMAL HIGH (ref 0.44–1.00)
GFR calc Af Amer: >60 mL/min (ref >60)
GFR calc non Af Amer: 55 mL/min — ABNORMAL LOW (ref >60)
GLUCOSE: 108 mg/dL — AB (ref 70–99)
POTASSIUM: 4.5 mmol/L (ref 3.5–5.1)
Sodium: 138 mmol/L (ref 135–145)

## 2017-11-01 LAB — CBC
HCT: 41 % (ref 36.0–46.0)
HEMOGLOBIN: 13.6 g/dL (ref 12.0–15.0)
MCH: 32 pg (ref 26.0–34.0)
MCHC: 33.2 g/dL (ref 30.0–36.0)
MCV: 96.5 fL (ref 78.0–100.0)
Platelets: 187 10*3/uL (ref 150–400)
RBC: 4.25 MIL/uL (ref 3.87–5.11)
RDW: 12.8 % (ref 11.5–15.5)
WBC: 4.9 10*3/uL (ref 4.0–10.5)

## 2017-11-01 LAB — I-STAT TROPONIN, ED
Troponin i, poc: 0 ng/mL (ref 0.00–0.08)
Troponin i, poc: 0.01 ng/mL (ref 0.00–0.08)

## 2017-11-01 NOTE — ED Triage Notes (Signed)
Pt reports jaw pain since March. Pt reports CP since Monday. Pt reports its a constant grabbing. Pt reports the pain started to radiate to L neck, L, shoulder, and L arm a couple days. Pt  Reports today she developed pain to R arm. Pt denies nausea, vomiting, SHOB, dizziness. Pt reports she just feels tired.

## 2017-11-02 NOTE — ED Provider Notes (Signed)
[facility] EMERGENCY DEPARTMENT Provider Note   CSN: [id number] Arrival date & time: 11/01/17  1646     History   Chief Complaint Chief Complaint  Patient presents with  . Chest Pain    HPI Deborah Fisher is a 67 y.o. female.  HPI Patient is a 67 year old female who presents to the emergency department with intermittent left jaw pain since March.  She has been seen and evaluated by Dr. Bettina Gavia with cardiology who is scheduled her for heart catheterization on September 18.  She presents today at the encouragement of her friends because throughout the week she had some ongoing mild intermittent left jaw pain but also developed a new occasional squeezing sensation of her left chest.  No associated shortness of breath.  She continues to exercise regularly.  She occasionally gets jaw discomfort with exercise but this does not always occur.  Recently she was on a 7 mile hike and she developed 15 minutes of jaw discomfort at the beginning of the hike but then was able to continue walking including up hills without any more jaw discomfort.  She did not have to stop.  She has a history of hyperlipidemia and a family history of cardiac disease but no history of diabetes or hypertension.  No history of tobacco abuse.  She is currently without any symptoms.   Past Medical History:  Diagnosis Date  . Bursitis    left hip   . Chronic constipation   . DI (detrusor instability)   . Diabetes ([place])    pre-diabetes, controlling with diet and exercise  . Femur fracture ([place]) 2011  . Fibroid   . Headache   . Menopausal symptoms   . Non-Hodgkin lymphoma ([place]) 1992   treated with CHOP  . Osteoporosis    h/o 3 stress fractures  . Postmenopausal HRT (hormone replacement therapy)   . Tennis elbow Right   . Trigger finger    bilateral     Patient Active Problem List   Diagnosis Date Noted  . Abnormality of gait 01/25/2016  . Left foot pain 01/01/2016  . Foot pain, right  01/01/2016  . Callus of foot 01/01/2016  . Headache, migraine 02/10/2014  . Osteoporosis   . DI (detrusor instability)   . Fibroid   . Menopausal symptoms   . Non-Hodgkin lymphoma [facility])     Past Surgical History:  Procedure Laterality Date  . Biopsy for Non Hodgkins Lymphoma  1992  . DILATION AND CURETTAGE OF UTERUS    . PELVIC LAPAROSCOPY    . TOE SURGERY       OB History    Gravida  0   Para      Term      Preterm      AB      Living        SAB      TAB      Ectopic      Multiple      Live Births               Home Medications    Prior to Admission medications   Medication Sig Start Date End Date Taking? Authorizing Provider  aspirin 81 MG tablet Take 1 tablet (81 mg total) by mouth daily. 10/10/17   Richardo Priest, MD  Calcium Carbonate-Vitamin D (CALCIUM + D PO) Take by mouth.    [provider]  Cetirizine HCl (ZYRTEC ALLERGY PO) Take by mouth.    [provider]  Cholecalciferol (VITAMIN D PO) Take by mouth.    [provider]  citalopram (CELEXA) 20 MG tablet Take 1 tablet (20 mg total) by mouth daily. 09/20/17   Megan Salon, MD  Cyanocobalamin (VITAMIN B 12 PO) Take by mouth daily.    [provider]  diclofenac sodium (VOLTAREN) 1 % GEL APPLY 2 GRAMS TO ELBOW EVERY 6 HOURS AS NEEDED FOR PAIN 02/04/15   [provider]  gabapentin (NEURONTIN) 100 MG capsule Take 2 tabs in the am, 2 tabs in the afternoon, and then three at night. 09/20/17   Megan Salon, MD  metoprolol tartrate (LOPRESSOR) 50 MG tablet Take 1 tablet (50 mg total) by mouth once for 1 dose. Take 1 tablet 1 hour prior to Cardiac CTA 10/10/17 10/10/17  Richardo Priest, MD  Multiple Vitamin (MULTIVITAMIN) tablet Take 1 tablet by mouth daily.    [provider]  predniSONE (DELTASONE) 50 MG tablet Take 1 tablet 13 hours prior to test, take another tablet 7 hours prior to test, and take another tablet 1 hour prior to test 10/10/17   Richardo Priest, MD  rizatriptan (MAXALT-MLT) 10 MG disintegrating tablet Take by mouth. 03/12/14   [provider]    Family History Family History  Problem Relation Age of Onset  . Diabetes Mother 22  . Hypertension Mother   . Alzheimer's disease Mother   . Glaucoma Mother   . Heart disease Mother   . Hypertension Father   . Alcoholism Father        deceased age 77  . Cirrhosis Father   . Diabetes Brother   . Hypertension Brother   . Colon cancer Maternal Uncle   . Heart disease Maternal Grandmother   . Breast cancer Cousin        Mat. 1st cousin-Age 34  . Breast cancer Maternal Aunt        Age 35's  . Heart disease Maternal Aunt   . Other Paternal Grandmother        MI    Social History Social History   Tobacco Use  . Smoking status: Never Smoker  . Smokeless tobacco: Never Used  Substance Use Topics  . Alcohol use: Yes    Alcohol/week: 2.0 standard drinks    Types: 2 Glasses of wine per week  . Drug use: No     Allergies   Contrast media [iodinated diagnostic agents]   Review of Systems Review of Systems  All other systems reviewed and are negative.    Physical Exam Updated Vital Signs BP 132/79   Pulse (!) 54   Temp 97.6 F (36.4 C) (Oral)   Resp 14   Ht 5\' 4"  (1.626 m)   Wt 76.7 kg   LMP 11/04/1990   SpO2 98%   BMI 29.01 kg/m   Physical Exam  Constitutional: She is oriented to person, place, and time. She appears well-developed and well-nourished. No distress.  HENT:  Head: Normocephalic and atraumatic.  Eyes: EOM are normal.  Neck: Normal range of motion.  Cardiovascular: Normal rate, regular rhythm and normal heart sounds.  Pulmonary/Chest: Effort normal and breath sounds normal.  Abdominal: Soft. She exhibits no distension. There is no tenderness.  Musculoskeletal: Normal range of motion.  Neurological: She is alert and oriented to person, place, and time.  Skin: Skin is warm and dry.  Psychiatric: She has a normal mood and  affect. Judgment normal.  Nursing note and vitals reviewed.  ED Treatments / Results  Labs (all labs ordered are listed, but only abnormal results are displayed) Labs Reviewed  BASIC METABOLIC PANEL - Abnormal; Notable for the following components:      Result Value   Glucose, Bld 108 (*)    Creatinine, Ser 1.01 (*)    GFR calc non Af Amer 55 (*)    All other components within normal limits  CBC  I-STAT TROPONIN, ED  I-STAT TROPONIN, ED    EKG EKG Interpretation  Date/Time:  Friday November 01 2017 23:30:47 EDT Ventricular Rate:  46 PR Interval:  192 QRS Duration: 153 QT Interval:  458 QTC Calculation: 401 R Axis:   52 Text Interpretation:  Sinus bradycardia Nonspecific intraventricular conduction delay No significant change was found Confirmed by Jola Schmidt [phone]) on 11/02/2017 12:06:41 AM   Radiology Dg Chest 2 View  Result Date: 11/01/2017 CLINICAL DATA:  Pt reports jaw pain since March. Pt reports CP since Monday. Pt reports its a constant grabbing. Pt reports the pain started to radiate to L neck, L, shoulder, and L arm a couple days. Today she has pain in the RIGHT arm. EXAM: CHEST - 2 VIEW COMPARISON:  None. FINDINGS: The heart size and mediastinal contours are within normal limits. Both lungs are clear. The visualized skeletal structures are unremarkable. IMPRESSION: No active cardiopulmonary disease. Electronically Signed   By: Nolon Nations M.D.   On: 11/01/2017 17:45    Procedures Procedures (including critical care time)  Medications Ordered in ED Medications - No data to display   Initial Impression / Assessment and Plan / ED Course  I have reviewed the triage vital signs and the nursing notes.  Pertinent labs & imaging results that were available during my care of the patient were reviewed by me and considered in my medical decision making (see chart for details).     EKG x2 without ischemic changes.  Troponin negative x2.  Asymptomatic in the  emergency department.  I do not think she needs emergent heart catheterization.  I do not think she needs to be admitted to the weekend.  I encouraged her to call her cardiologist for follow-up.  She is encouraged to return to the emergency department for any new or worsening symptoms.  Doubt PE.  Doubt dissection.  Sounds like angina without unstable angina.  Heart catheterization will be beneficial.  Not in an emergent fashion.  Final Clinical Impressions(s) / ED Diagnoses   Final diagnoses:  Precordial chest pain    ED Discharge Orders    None       Jola Schmidt, MD 11/02/17 [phone]

## 2017-11-07 ENCOUNTER — Telehealth: Payer: Self-pay | Admitting: Cardiology

## 2017-11-07 NOTE — Telephone Encounter (Signed)
Just an FYI - patient called today expressing frustration over not being able to speak to doctor and not being able to get in sooner or get testing done sooner. Patient stated her PCP was very upset also with this situation. We ended up getting disconnected so I called her back and left a message that she could get in to see doctor earlier in [place] due to cancellations.

## 2017-11-14 NOTE — Addendum Note (Signed)
Addended by: Tarri Glenn on: 11/14/2017 09:46 AM   Modules accepted: Orders

## 2017-11-14 NOTE — Addendum Note (Signed)
Addended by: Tarri Glenn on: 11/14/2017 09:45 AM   Modules accepted: Orders

## 2017-11-15 LAB — BASIC METABOLIC PANEL
BUN/Creatinine Ratio: 20 (ref 12–28)
BUN: 19 mg/dL (ref 8–27)
CHLORIDE: 100 mmol/L (ref 96–106)
CO2: 23 mmol/L (ref 20–29)
CREATININE: 0.94 mg/dL (ref 0.57–1.00)
Calcium: 9.9 mg/dL (ref 8.7–10.3)
GFR calc Af Amer: 72 mL/min/{1.73_m2} (ref >59)
GFR calc non Af Amer: 62 mL/min/{1.73_m2} (ref >59)
GLUCOSE: 88 mg/dL (ref 65–99)
Potassium: 4.9 mmol/L (ref 3.5–5.2)
SODIUM: 139 mmol/L (ref 134–144)

## 2017-11-15 NOTE — Telephone Encounter (Signed)
Pt called back and I let her know labs were normal and could contiune with procedure per phone note in chart by Elmyra Ricks. Pt verbalized understanding.

## 2017-11-20 ENCOUNTER — Ambulatory Visit (HOSPITAL_COMMUNITY): Payer: Medicare Other

## 2017-11-20 ENCOUNTER — Ambulatory Visit (HOSPITAL_COMMUNITY): Admission: RE | Admit: 2017-11-20 | Payer: Medicare Other | Source: Ambulatory Visit

## 2017-11-20 ENCOUNTER — Ambulatory Visit (HOSPITAL_COMMUNITY)
Admission: RE | Admit: 2017-11-20 | Discharge: 2017-11-20 | Disposition: A | Discharge status: Home / Self Care | Payer: Medicare Other | Source: Ambulatory Visit | Attending: Cardiology | Admitting: Cardiology

## 2017-11-20 DIAGNOSIS — I209 Angina pectoris, unspecified: Secondary | ICD-10-CM | POA: Diagnosis not present

## 2017-11-20 DIAGNOSIS — J8410 Pulmonary fibrosis, unspecified: Secondary | ICD-10-CM | POA: Insufficient documentation

## 2017-11-20 MED ORDER — NITROGLYCERIN 0.4 MG SL SUBL
SUBLINGUAL_TABLET | SUBLINGUAL | Status: AC
  Filled 2017-11-20: qty 2

## 2017-11-20 MED ORDER — NITROGLYCERIN 0.4 MG SL SUBL
0.8000 mg | SUBLINGUAL_TABLET | Freq: Once | SUBLINGUAL | Status: AC
  Administered 2017-11-20: 0.8 mg via SUBLINGUAL
  Filled 2017-11-20: qty 25

## 2017-11-20 MED ORDER — IOPAMIDOL (ISOVUE-370) INJECTION 76%
100.0000 mL | Freq: Once | INTRAVENOUS | Status: AC | PRN
  Administered 2017-11-20: 80 mL via INTRAVENOUS

## 2017-11-21 ENCOUNTER — Telehealth: Payer: Self-pay | Admitting: *Deleted

## 2017-11-21 DIAGNOSIS — E785 Hyperlipidemia, unspecified: Secondary | ICD-10-CM

## 2017-11-21 DIAGNOSIS — I209 Angina pectoris, unspecified: Secondary | ICD-10-CM

## 2017-11-21 MED ORDER — PRAVASTATIN SODIUM 20 MG PO TABS: 20.0000 mg | ORAL_TABLET | Freq: Every evening | ORAL | 3 refills | Status: DC

## 2017-11-21 MED ORDER — DILTIAZEM HCL ER COATED BEADS 180 MG PO CP24: 180.0000 mg | ORAL_CAPSULE | Freq: Every day | ORAL | 3 refills | Status: DC

## 2017-11-21 NOTE — Telephone Encounter (Signed)
Patient informed of cardiac CT results and advised of medication management of symptoms Dr. Bettina Gavia has suggested. Patient agreeable at this time. Prescriptions for cardizem CD 180 mg daily and pravastatin 20 mg daily sent in to [facility] in [place]. Patient advised to stop taking metoprolol. Informed her of follow up lab work that needed to be done in 6 weeks. Patient verbalized understanding. No further questions.

## 2017-11-21 NOTE — Telephone Encounter (Signed)
-----   Message from Richardo Priest, MD sent at 11/21/2017  7:59 AM EDT ----- Normal or stable result  At times individuals have symptoms due to small vessels microvascular none seen on CT.  Her prognosis is good but symptoms can be bothersome I would ask her to consider a trial of medication is more effective to alleviate this problem stop metoprolol take Cardizem CD 180 mg daily and a statin can be remarkably effective in this situation to take a low-dose of a low intensity statin pravastatin and follow-up in the office and see me in 2 to 3 months to assess her response.  If agreeable discontinue metoprolol ordered Cardizem CD 180 mg #30 3 refills pravastatin 20 mg #30 3 refills and follow-up lipid profile and CMP in 6 weeks

## 2017-12-04 NOTE — Progress Notes (Signed)
Cardiology Office Note:    Date:  12/05/2017   ID:  Deborah Fisher, DOB 09/13/1950, MRN [id number]  PCP:  Marton Redwood, MD  Cardiologist:  Shirlee More, MD    Referring MD: Marton Redwood, MD    ASSESSMENT:    1. Angina pectoris ([place])   2. Screening cholesterol level   3. Hyperlipidemia, unspecified hyperlipidemia type    PLAN:    In order of problems listed above:  1. 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  She has started taking a statin follow-up lipids in 2 weeks she is on a calcium channel blocker and a prescription for nitroglycerin if needed.  I think at this point time the best course is therapeutic trial and have asked her to return to full unrestricted activities. 2. She will return in 2 weeks to have a lipid profile and a CMP and continue her low intensity statin   Next appointment: 3 months   Medication Adjustments/Labs and Tests Ordered: Current medicines are reviewed at length with the patient today.  Concerns regarding medicines are outlined above.  Orders Placed This Encounter  Procedures  . Comprehensive Metabolic Panel (CMET)  . Lipid Profile   Meds ordered this encounter  Medications  . nitroGLYCERIN (NITROSTAT) 0.4 MG SL tablet    Sig: Place 1 tablet (0.4 mg total) under the tongue every 5 (five) minutes as needed for chest pain.    Dispense:  25 tablet    Refill:  11    Chief Complaint  Patient presents with  . Chest Pain    History of Present Illness:    Deborah Fisher is a 67 y.o. female with a hx of typical angina with normal cardiac CTA  last seen 10/10/16. Compliance with diet, lifestyle and medications: Yes  She is improved since starting a calcium channel blocker she has had some mild fatigue but it is not frequent and she is willing to continue medication.  She started a statin despite a calcium score of 0 I think that it is a good idea and that its beneficial micro vascular disease.  She has been on long-term hormone  replacement therapy and estrogen withdrawal may play a role in her symptoms but I would not place her back on it.  She has had no recurrent exertional angina dyspnea or palpitation Past Medical History:  Diagnosis Date  . Bursitis    left hip   . Chronic constipation   . DI (detrusor instability)   . Diabetes ([place])    pre-diabetes, controlling with diet and exercise  . Femur fracture ([place]) 2011  . Fibroid   . Headache   . Menopausal symptoms   . Non-Hodgkin lymphoma ([place]) 1992   treated with CHOP  . Osteoporosis    h/o 3 stress fractures  . Postmenopausal HRT (hormone replacement therapy)   . Tennis elbow Right   . Trigger finger    bilateral     Past Surgical History:  Procedure Laterality Date  . Biopsy for Non Hodgkins Lymphoma  1992  . DILATION AND CURETTAGE OF UTERUS    . PELVIC LAPAROSCOPY    . TOE SURGERY      Current Medications: Current Meds  Medication Sig  . aspirin 81 MG tablet Take 1 tablet (81 mg total) by mouth daily.  . Calcium Carbonate-Vitamin D (CALCIUM + D PO) Take 1 tablet by mouth 2 (two) times daily.   . Cetirizine HCl (ZYRTEC ALLERGY PO) Take 1 tablet by mouth daily as needed.   Marland Kitchen  Cholecalciferol (VITAMIN D PO) Take 1 tablet by mouth 2 (two) times daily.   . citalopram (CELEXA) 20 MG tablet Take 1 tablet (20 mg total) by mouth daily.  . Cyanocobalamin (VITAMIN B 12 PO) Take 1 tablet by mouth daily.   Marland Kitchen diltiazem (CARDIZEM CD) 180 MG 24 hr capsule Take 1 capsule (180 mg total) by mouth daily.  Marland Kitchen gabapentin (NEURONTIN) 100 MG capsule Take 2 tabs in the am, 2 tabs in the afternoon, and then three at night.  . Multiple Vitamin (MULTIVITAMIN) tablet Take 1 tablet by mouth daily.  . pravastatin (PRAVACHOL) 20 MG tablet Take 1 tablet (20 mg total) by mouth every evening.  . rizatriptan (MAXALT-MLT) 10 MG disintegrating tablet Take 10 mg by mouth as needed.      Allergies:   Iodinated diagnostic agents   Social History   Socioeconomic History  .  Marital status: Single    Spouse name: Not on file  . Number of children: 0  . Years of education: PhD  . 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 Financial resource strain: Not on file  . Food insecurity:    Worry: Not on file    Inability: Not on file  . Transportation needs:    Medical: Not on file    Non-medical: Not on file  Tobacco Use  . Smoking status: Never Smoker  . Smokeless tobacco: Never Used  Substance and Sexual Activity  . Alcohol use: Yes    Alcohol/week: 2.0 standard drinks    Types: 2 Glasses of wine per week  . Drug use: No  . Sexual activity: Not Currently    Partners: Male    Birth control/protection: Post-menopausal  Lifestyle  . Physical activity:    Days per week: Not on file    Minutes per session: Not on file  . Stress: Not on file  Relationships  . Social connections:    Talks on phone: Not on file    Gets together: Not on file    Attends religious service: Not on file    Active member of club or organization: Not on file    Attends meetings of clubs or organizations: Not on file    Relationship status: Not on file  Other Topics Concern  . Not on file  Social History Narrative  . Not on file     Family History: The patient's family history includes Alcoholism in her father; Alzheimer's disease in her mother; Breast cancer in her cousin and maternal aunt; Cirrhosis in her father; Colon cancer in her maternal uncle; Diabetes in her brother; Diabetes (age of onset: 57) in her mother; Glaucoma in her mother; Heart disease in her maternal aunt, maternal grandmother, and mother; Hypertension in her brother, father, and mother; Other in her paternal grandmother. ROS:   Please see the history of present illness.    All other systems reviewed and are negative.  EKGs/Labs/Other Studies Reviewed:    The following studies were reviewed today  Recent  Labs: 11/01/2017: Hemoglobin 13.6; Platelets 187 11/14/2017: BUN 19; Creatinine, Ser 0.94; Potassium 4.9; Sodium 139  Recent Lipid Panel No results found for: CHOL, TRIG, HDL, CHOLHDL, VLDL, LDLCALC, LDLDIRECT  Physical Exam:    VS:  BP 122/66 (BP Location: Right Arm, Patient Position: Sitting, Cuff Size: Normal)   Pulse 67   Ht 5\' 5"  (1.651 m)   Wt  169 lb 12.8 oz (77 kg)   LMP 11/04/1990   SpO2 97%   BMI 28.26 kg/m     Wt Readings from Last 3 Encounters:  12/05/17 169 lb 12.8 oz (77 kg)  11/01/17 169 lb (76.7 kg)  10/10/17 170 lb 12.8 oz (77.5 kg)     GEN:  Well nourished, well developed in no acute distress HEENT: Normal NECK: No JVD; No carotid bruits LYMPHATICS: No lymphadenopathy CARDIAC: RRR, no murmurs, rubs, gallops RESPIRATORY:  Clear to auscultation without rales, wheezing or rhonchi  ABDOMEN: Soft, non-tender, non-distended MUSCULOSKELETAL:  No edema; No deformity  SKIN: Warm and dry NEUROLOGIC:  Alert and oriented x 3 PSYCHIATRIC:  Normal affect    Signed, Shirlee More, MD  12/05/2017 5:05 PM    [facility]

## 2017-12-05 ENCOUNTER — Ambulatory Visit: Payer: Medicare Other | Admitting: Cardiology

## 2017-12-05 VITALS — BP 122/66 | HR 67 | Ht 65.0 in | Wt 169.8 lb

## 2017-12-05 DIAGNOSIS — Z13220 Encounter for screening for lipoid disorders: Secondary | ICD-10-CM | POA: Diagnosis not present

## 2017-12-05 DIAGNOSIS — E785 Hyperlipidemia, unspecified: Secondary | ICD-10-CM | POA: Diagnosis not present

## 2017-12-05 DIAGNOSIS — I209 Angina pectoris, unspecified: Secondary | ICD-10-CM | POA: Diagnosis not present

## 2017-12-05 MED ORDER — NITROGLYCERIN 0.4 MG SL SUBL: 0.4000 mg | SUBLINGUAL_TABLET | SUBLINGUAL | 11 refills | Status: DC | PRN

## 2017-12-05 NOTE — Patient Instructions (Signed)
Medication Instructions:  Your physician has recommended you make the following change in your medication:   START nitroglycerin as needed for chest pain: When having chest pain, stop what you are doing and sit down. Take 1 nitro, wait 5 minutes. Still having chest pain, take 1 nitro, wait 5 minutes. Still having chest pain, take 1 nitro, dial 911. Total of 3 nitro in 15 minutes.  Labwork: Your physician recommends that you return for lab work in 2 weeks: CMP, lipid panel. You can return to our [facility] office for labs, no appointment needed. Please fast beforehand.   Testing/Procedures: None  Follow-Up: Your physician wants you to follow-up in: 3 months. You will receive a reminder letter in the mail two months in advance. If you don't receive a letter, please call our office to schedule the follow-up appointment.   If you need a refill on your cardiac medications before your next appointment, please call your pharmacy.   Thank you for choosing [facility]! Robyne Peers, RN [phone]   Nitroglycerin sublingual tablets What is this medicine? NITROGLYCERIN (nye troe GLI ser in) is a type of vasodilator. It relaxes blood vessels, increasing the blood and oxygen supply to your heart. This medicine is used to relieve chest pain caused by angina. It is also used to prevent chest pain before activities like climbing stairs, going outdoors in cold weather, or sexual activity. This medicine may be used for other purposes; ask your health care provider or pharmacist if you have questions. COMMON BRAND NAME(S): Nitroquick, Nitrostat, Nitrotab What should I tell my health care provider before I take this medicine? They need to know if you have any of these conditions: -anemia -head injury, recent stroke, or bleeding in the brain -liver disease -previous heart attack -an unusual or allergic reaction to nitroglycerin, other medicines, foods, dyes, or preservatives -pregnant or  trying to get pregnant -breast-feeding How should I use this medicine? Take this medicine by mouth as needed. At the first sign of an angina attack (chest pain or tightness) place one tablet under your tongue. You can also take this medicine 5 to 10 minutes before an event likely to produce chest pain. Follow the directions on the prescription label. Let the tablet dissolve under the tongue. Do not swallow whole. Replace the dose if you accidentally swallow it. It will help if your mouth is not dry. Saliva around the tablet will help it to dissolve more quickly. Do not eat or drink, smoke or chew tobacco while a tablet is dissolving. If you are not better within 5 minutes after taking ONE dose of nitroglycerin, call 9-1-1 immediately to seek emergency medical care. Do not take more than 3 nitroglycerin tablets over 15 minutes. If you take this medicine often to relieve symptoms of angina, your doctor or health care professional may provide you with different instructions to manage your symptoms. If symptoms do not go away after following these instructions, it is important to call 9-1-1 immediately. Do not take more than 3 nitroglycerin tablets over 15 minutes. Talk to your pediatrician regarding the use of this medicine in children. Special care may be needed. Overdosage: If you think you have taken too much of this medicine contact a poison control center or emergency room at once. NOTE: This medicine is only for you. Do not share this medicine with others. What if I miss a dose? This does not apply. This medicine is only used as needed. What may interact with this medicine? Do not  take this medicine with any of the following medications: -certain migraine medicines like ergotamine and dihydroergotamine (DHE) -medicines used to treat erectile dysfunction like sildenafil, tadalafil, and vardenafil -riociguat This medicine may also interact with the following  medications: -alteplase -aspirin -heparin -medicines for high blood pressure -medicines for mental depression -other medicines used to treat angina -phenothiazines like chlorpromazine, mesoridazine, prochlorperazine, thioridazine This list may not describe all possible interactions. Give your health care provider a list of all the medicines, herbs, non-prescription drugs, or dietary supplements you use. Also tell them if you smoke, drink alcohol, or use illegal drugs. Some items may interact with your medicine. What should I watch for while using this medicine? Tell your doctor or health care professional if you feel your medicine is no longer working. Keep this medicine with you at all times. Sit or lie down when you take your medicine to prevent falling if you feel dizzy or faint after using it. Try to remain calm. This will help you to feel better faster. If you feel dizzy, take several deep breaths and lie down with your feet propped up, or bend forward with your head resting between your knees. You may get drowsy or dizzy. Do not drive, use machinery, or do anything that needs mental alertness until you know how this drug affects you. Do not stand or sit up quickly, especially if you are an older patient. This reduces the risk of dizzy or fainting spells. Alcohol can make you more drowsy and dizzy. Avoid alcoholic drinks. Do not treat yourself for coughs, colds, or pain while you are taking this medicine without asking your doctor or health care professional for advice. Some ingredients may increase your blood pressure. What side effects may I notice from receiving this medicine? Side effects that you should report to your doctor or health care professional as soon as possible: -blurred vision -dry mouth -skin rash -sweating -the feeling of extreme pressure in the head -unusually weak or tired Side effects that usually do not require medical attention (report to your doctor or health care  professional if they continue or are bothersome): -flushing of the face or neck -headache -irregular heartbeat, palpitations -nausea, vomiting This list may not describe all possible side effects. Call your doctor for medical advice about side effects. You may report side effects to FDA at 1-800-FDA-1088. Where should I keep my medicine? Keep out of the reach of children. Store at room temperature between 20 and 25 degrees C (68 and 77 degrees F). Store in [facility] of [employer]. Protect from light and moisture. Keep tightly closed. Throw away any unused medicine after the expiration date. NOTE: This sheet is a summary. It may not cover all possible information. If you have questions about this medicine, talk to your doctor, pharmacist, or health care provider.  2018 Elsevier/Gold Standard (2012-12-18 17:57:36)

## 2017-12-18 ENCOUNTER — Ambulatory Visit: Payer: Medicare Other | Admitting: Interventional Cardiology

## 2017-12-19 ENCOUNTER — Other Ambulatory Visit: Payer: Medicare Other | Admitting: *Deleted

## 2017-12-19 LAB — COMPREHENSIVE METABOLIC PANEL
ALT: 22 IU/L (ref 0–32)
AST: 29 IU/L (ref 0–40)
Albumin/Globulin Ratio: 1.7 (ref 1.2–2.2)
Albumin: 4.5 g/dL (ref 3.6–4.8)
Alkaline Phosphatase: 89 IU/L (ref 39–117)
BUN/Creatinine Ratio: 17 (ref 12–28)
BUN: 15 mg/dL (ref 8–27)
Bilirubin Total: 0.3 mg/dL (ref 0.0–1.2)
CALCIUM: 9.8 mg/dL (ref 8.7–10.3)
CHLORIDE: 100 mmol/L (ref 96–106)
CO2: 23 mmol/L (ref 20–29)
Creatinine, Ser: 0.88 mg/dL (ref 0.57–1.00)
GFR calc Af Amer: 78 mL/min/{1.73_m2} (ref >59)
GFR, EST NON AFRICAN AMERICAN: 67 mL/min/{1.73_m2} (ref >59)
GLUCOSE: 97 mg/dL (ref 65–99)
Globulin, Total: 2.7 g/dL (ref 1.5–4.5)
Potassium: 4.4 mmol/L (ref 3.5–5.2)
Sodium: 139 mmol/L (ref 134–144)
TOTAL PROTEIN: 7.2 g/dL (ref 6.0–8.5)

## 2017-12-19 LAB — LIPID PANEL
Chol/HDL Ratio: 1.8 ratio (ref 0.0–4.4)
Cholesterol, Total: 221 mg/dL — ABNORMAL HIGH (ref 100–199)
HDL: 122 mg/dL (ref >39)
LDL Calculated: 86 mg/dL (ref 0–99)
TRIGLYCERIDES: 65 mg/dL (ref 0–149)
VLDL CHOLESTEROL CAL: 13 mg/dL (ref 5–40)

## 2017-12-22 ENCOUNTER — Encounter: Payer: Self-pay | Admitting: Interventional Cardiology

## 2017-12-22 DIAGNOSIS — I249 Acute ischemic heart disease, unspecified: Secondary | ICD-10-CM | POA: Insufficient documentation

## 2018-02-04 ENCOUNTER — Telehealth: Payer: Self-pay

## 2018-02-04 DIAGNOSIS — I209 Angina pectoris, unspecified: Secondary | ICD-10-CM

## 2018-02-04 MED ORDER — ASPIRIN 81 MG PO TABS: 81.0000 mg | ORAL_TABLET | Freq: Every day | ORAL | 1 refills | Status: DC

## 2018-02-04 NOTE — Telephone Encounter (Signed)
rx sent to pharmacy as requested

## 2018-02-19 ENCOUNTER — Telehealth: Payer: Self-pay | Admitting: *Deleted

## 2018-02-19 MED ORDER — DILTIAZEM HCL ER COATED BEADS 180 MG PO CP24: 180.0000 mg | ORAL_CAPSULE | Freq: Every day | ORAL | 3 refills | Status: DC

## 2018-02-19 NOTE — Telephone Encounter (Signed)
*  STAT* If patient is at the pharmacy, call can be transferred to refill team.   1. Which medications need to be refilled? (please list name of each medication and dose if known) Diltiazem 24 H ER (CD) 180 MG CP  2. Which pharmacy/location (including street and city if local pharmacy) is medication to be sent to?[facility] [address]  3. Do they need a 30 day or 90 day supply? [place]

## 2018-02-21 ENCOUNTER — Encounter: Payer: Self-pay | Admitting: Interventional Cardiology

## 2018-03-06 ENCOUNTER — Ambulatory Visit: Payer: Medicare Other | Admitting: Cardiology

## 2018-03-13 ENCOUNTER — Telehealth: Payer: Self-pay

## 2018-03-13 MED ORDER — PRAVASTATIN SODIUM 20 MG PO TABS: 20.0000 mg | ORAL_TABLET | Freq: Every evening | ORAL | 3 refills | Status: DC

## 2018-03-13 NOTE — Telephone Encounter (Signed)
Rx refill for pravastatin sent to pharmacy as requested.

## 2018-03-17 NOTE — Progress Notes (Signed)
Cardiology Office Note:    Date:  03/18/2018   ID:  Deborah Fisher, DOB 03/19/1950, MRN [id number]  PCP:  Marton Redwood, MD  Cardiologist:  No primary care provider on file.   Referring MD: Marton Redwood, MD   Chief Complaint  Patient presents with  . Advice Only    Pain exertional jaw pain/angina  . Migraine    History of Present Illness:    Deborah Fisher is a 68 y.o. female with a hx of non Hodgkin's lymphoma referred by Dr. Brigitte Pulse for evaluation of chest pain.  Prior cardiac evaluation by Dr. Bettina Gavia suggest the possibility of syndrome X/microvascular angina.  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  She has a several year history of exertional related right jaw discomfort and a separate times a crampy left chest discomfort.  The chest discomfort is more apt to occur under emotional stress.  The jaw discomfort seems to also be more prevalent when the patient is under work or emotional stress.  Episodes of either can occur at rest.  These episodes do not awaken her from sleep.  There is no associated shortness of breath.  She is very active.  She goes on extended hikes that can be up to 6 hours and is not limited in her exertional tolerance.  This history led to cardiac evaluation by Dr. Bettina Gavia.  This evaluation identified extremely elevated lipids with a disproportionately high HDL.  A coronary CT angiogram performed in September demonstrated no coronary calcification with an agonist and score of 0.  No obstructive disease was noted.  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  She occasionally uses sublingual nitroglycerin.  Diltiazem was started.  Statin therapy has also been recommended.  She is doing well on the current medical regimen.  She liked Dr. Bettina Gavia quite a bit but did not want to drive to [facility] for appointments.  Past Medical History:  Diagnosis Date  . Bursitis    left hip   . Chronic  constipation   . DI (detrusor instability)   . Diabetes ([place])    pre-diabetes, controlling with diet and exercise  . Femur fracture ([place]) 2011  . Fibroid   . Headache   . Menopausal symptoms   . Non-Hodgkin lymphoma ([place]) 1992   treated with CHOP  . Osteoporosis    h/o 3 stress fractures  . Postmenopausal HRT (hormone replacement therapy)   . Tennis elbow Right   . Trigger finger    bilateral     Past Surgical History:  Procedure Laterality Date  . Biopsy for Non Hodgkins Lymphoma  1992  . DILATION AND CURETTAGE OF UTERUS    . PELVIC LAPAROSCOPY    . TOE SURGERY      Current Medications: Current Meds  Medication Sig  . aspirin 81 MG tablet Take 1 tablet (81 mg total) by mouth daily.  . Calcium Carbonate-Vitamin D (CALCIUM + D PO) Take 1 tablet by mouth 2 (two) times daily.   . Cetirizine HCl (ZYRTEC ALLERGY PO) Take 1 tablet by mouth daily as needed.   . Cholecalciferol (VITAMIN D PO) Take 1 tablet by mouth 2 (two) times daily.   . citalopram (CELEXA) 20 MG tablet Take 1 tablet (20 mg total) by mouth daily.  . Cyanocobalamin (VITAMIN B 12 PO) Take 1 tablet by mouth daily.   Marland Kitchen diltiazem (CARDIZEM CD) 180 MG 24 hr capsule Take 1 capsule (180 mg  total) by mouth daily.  Marland Kitchen gabapentin (NEURONTIN) 100 MG capsule Take 2 tabs in the am, 2 tabs in the afternoon, and then three at night.  . Multiple Vitamin (MULTIVITAMIN) tablet Take 1 tablet by mouth daily.  . nitroGLYCERIN (NITROSTAT) 0.4 MG SL tablet Place 1 tablet (0.4 mg total) under the tongue every 5 (five) minutes as needed for chest pain.  . pravastatin (PRAVACHOL) 20 MG tablet Take 1 tablet (20 mg total) by mouth every evening.  . rizatriptan (MAXALT-MLT) 10 MG disintegrating tablet Take 10 mg by mouth as needed.      Allergies:   Iodinated diagnostic agents   Social History   Socioeconomic History  . Marital status: Single    Spouse name: Not on file  . Number of children: 0  . Years of education: PhD  . 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 Financial resource strain: Not on file  . Food insecurity:    Worry: Not on file    Inability: Not on file  . Transportation needs:    Medical: Not on file    Non-medical: Not on file  Tobacco Use  . Smoking status: Never Smoker  . Smokeless tobacco: Never Used  Substance and Sexual Activity  . Alcohol use: Yes    Alcohol/week: 2.0 standard drinks    Types: 2 Glasses of wine per week  . Drug use: No  . Sexual activity: Not Currently    Partners: Male    Birth control/protection: Post-menopausal  Lifestyle  . Physical activity:    Days per week: Not on file    Minutes per session: Not on file  . Stress: Not on file  Relationships  . Social connections:    Talks on phone: Not on file    Gets together: Not on file    Attends religious service: Not on file    Active member of club or organization: Not on file    Attends meetings of clubs or organizations: Not on file    Relationship status: Not on file  Other Topics Concern  . Not on file  Social History Narrative  . Not on file     Family History: The patient's family history includes Alcoholism in her father; Alzheimer's disease in her mother; Breast cancer in her cousin and maternal aunt; Cirrhosis in her father; Colon cancer in her maternal uncle; Diabetes in her brother; Diabetes (age of onset: 58) in her mother; Glaucoma in her mother; Heart disease in her maternal aunt, maternal grandmother, and mother; Hypertension in her brother, father, and mother; Other in her paternal grandmother.  ROS:   Please see the history of present illness.    Recurring migraine headaches.  Tendency towards anxiety and stress.  Occasional bilateral lower extremity swelling if sitting for prolonged periods of time.  All other systems reviewed and are negative.  EKGs/Labs/Other Studies Reviewed:    The following  studies were reviewed today: Coronary CT Angie 11/20/2017:  IMPRESSION: 1.  Coronary artery calcium score 0 Agatston units.  2.  No significant coronary disease noted.  EKG:  EKG is not repeated today and the tracing from 11/02/2017 is personally reviewed.  Other than sinus bradycardia, the tracing is normal.  There is no voltage criteria to suggest hypertrophy.  Recent Labs: 11/01/2017: Hemoglobin 13.6; Platelets 187 12/19/2017: ALT 22; BUN 15; Creatinine, Ser 0.88; Potassium 4.4; Sodium  139  Recent Lipid Panel    Component Value Date/Time   CHOL 221 (H) 12/19/2017 0000   TRIG 65 12/19/2017 0000   HDL 122 12/19/2017 0000   CHOLHDL 1.8 12/19/2017 0000   LDLCALC 86 12/19/2017 0000    Physical Exam:    VS:  BP (!) 116/58   Pulse (!) 57   Ht 5\' 5"  (1.651 m)   Wt 171 lb 9.6 oz (77.8 kg)   LMP 11/04/1990   SpO2 98%   BMI 28.56 kg/m     Wt Readings from Last 3 Encounters:  03/18/18 171 lb 9.6 oz (77.8 kg)  12/05/17 169 lb 12.8 oz (77 kg)  11/01/17 169 lb (76.7 kg)     GEN: Appears younger than stated age. No acute distress HEENT: Normal NECK: No JVD. LYMPHATICS: No lymphadenopathy CARDIAC: RRR.  No murmur, gallop, edema VASCULAR: Pulses are 2+ in the radial and carotids bilaterally., Bruits are absent in the neck. RESPIRATORY:  Clear to auscultation without rales, wheezing or rhonchi  ABDOMEN: Soft, non-tender, non-distended, No pulsatile mass, MUSCULOSKELETAL: No deformity  SKIN: Warm and dry NEUROLOGIC:  Alert and oriented x 3 PSYCHIATRIC:  Normal affect   ASSESSMENT:    1. Angina pectoris ([place])   2. Hyperlipidemia LDL goal <70    PLAN:    In order of problems listed above:  1. Jaw discomfort and atypical chest pain labeled based upon prior evaluation as microvascular angina.  No demonstration of ischemia on nuclear scintigraphy.  Widely patent coronary arteries on coronary CT NGO and absence of coronary calcification raises some doubt about the diagnosis.   She seems to be getting along quite well from a clinical standpoint.  She has used nitroglycerin occasionally for the jaw discomfort with relief.  We will perform an echocardiogram to rule out left ventricular hypertrophy and to assess LV function.  Continue current clinical course. 2. Continue moderate intensity statin therapy. 3. 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  2D Doppler echocardiogram is ordered to document LV function and rule out left ventricular hypertrophy which seems unlikely given normal appearance to EKG.  My assumption is that an echo has been done before by Dr. Bettina Gavia but I cannot find the report and the patient states that she has not had one performed.  Clinical follow-up 9 to 12 months as needed     Medication Adjustments/Labs and Tests Ordered: Current medicines are reviewed at length with the patient today.  Concerns regarding medicines are outlined above.  Orders Placed This Encounter  Procedures  . ECHOCARDIOGRAM COMPLETE   No orders of the defined types were placed in this encounter.   Patient Instructions  Medication Instructions:  Your physician recommends that you continue on your current medications as directed. Please refer to the Current Medication list given to you today.  If you need a refill on your cardiac medications before your next appointment, please call your pharmacy.   Lab work: None If you have labs (blood work) drawn today and your tests are completely normal, you will receive your results only by: Marland Kitchen MyChart Message (if you have MyChart) OR . A paper copy in the mail If you have any lab test that is abnormal or we need to change your treatment, we will call you to review the results.  Testing/Procedures: Your physician has requested that you have an echocardiogram. Echocardiography is a painless test that uses sound waves to create images of your heart. It provides your doctor with  information about the  size and shape of your heart and how well your heart's chambers and valves are working. This procedure takes approximately one hour. There are no restrictions for this procedure.   Follow-Up: At [facility], you and your health needs are our priority.  As part of our continuing mission to provide you with exceptional heart care, we have created designated Provider Care Teams.  These Care Teams include your primary Cardiologist (physician) and Advanced Practice Providers (APPs -  Physician Assistants and Nurse Practitioners) who all work together to provide you with the care you need, when you need it. You will need a follow up appointment in 9-12 months.  Please call our office 2 months in advance to schedule this appointment.  You may see Dr. Tamala Julian or one of the following Advanced Practice Providers on your designated Care Team:   Truitt Merle, NP Cecilie Kicks, NP . Kathyrn Drown, NP  Any Other Special Instructions Will Be Listed Below (If Applicable).       Signed, Sinclair Grooms, MD  03/18/2018 12:52 PM    [place]

## 2018-03-18 ENCOUNTER — Encounter: Payer: Self-pay | Admitting: Interventional Cardiology

## 2018-03-18 ENCOUNTER — Ambulatory Visit: Payer: Medicare Other | Admitting: Interventional Cardiology

## 2018-03-18 VITALS — BP 116/58 | HR 57 | Ht 65.0 in | Wt 171.6 lb

## 2018-03-18 DIAGNOSIS — I209 Angina pectoris, unspecified: Secondary | ICD-10-CM | POA: Diagnosis not present

## 2018-03-18 DIAGNOSIS — E785 Hyperlipidemia, unspecified: Secondary | ICD-10-CM | POA: Diagnosis not present

## 2018-03-18 DIAGNOSIS — G43809 Other migraine, not intractable, without status migrainosus: Secondary | ICD-10-CM

## 2018-03-18 NOTE — Patient Instructions (Signed)
Medication Instructions:  Your physician recommends that you continue on your current medications as directed. Please refer to the Current Medication list given to you today.  If you need a refill on your cardiac medications before your next appointment, please call your pharmacy.   Lab work: None If you have labs (blood work) drawn today and your tests are completely normal, you will receive your results only by: Marland Kitchen MyChart Message (if you have MyChart) OR . A paper copy in the mail If you have any lab test that is abnormal or we need to change your treatment, we will call you to review the results.  Testing/Procedures: Your physician has requested that you have an echocardiogram. Echocardiography is a painless test that uses sound waves to create images of your heart. It provides your doctor with information about the size and shape of your heart and how well your heart's chambers and valves are working. This procedure takes approximately one hour. There are no restrictions for this procedure.   Follow-Up: At [facility], you and your health needs are our priority.  As part of our continuing mission to provide you with exceptional heart care, we have created designated Provider Care Teams.  These Care Teams include your primary Cardiologist (physician) and Advanced Practice Providers (APPs -  Physician Assistants and Nurse Practitioners) who all work together to provide you with the care you need, when you need it. You will need a follow up appointment in 9-12 months.  Please call our office 2 months in advance to schedule this appointment.  You may see Dr. Tamala Julian or one of the following Advanced Practice Providers on your designated Care Team:   Truitt Merle, NP Cecilie Kicks, NP . Kathyrn Drown, NP  Any Other Special Instructions Will Be Listed Below (If Applicable).

## 2018-03-21 ENCOUNTER — Ambulatory Visit (HOSPITAL_COMMUNITY): Payer: Medicare Other | Attending: Cardiovascular Disease

## 2018-03-21 ENCOUNTER — Other Ambulatory Visit: Payer: Self-pay

## 2018-03-21 DIAGNOSIS — I209 Angina pectoris, unspecified: Secondary | ICD-10-CM

## 2018-04-07 ENCOUNTER — Other Ambulatory Visit: Payer: Self-pay | Admitting: Internal Medicine

## 2018-04-07 DIAGNOSIS — Z1231 Encounter for screening mammogram for malignant neoplasm of breast: Secondary | ICD-10-CM

## 2018-04-10 ENCOUNTER — Other Ambulatory Visit: Payer: Self-pay | Admitting: Cardiology

## 2018-04-10 DIAGNOSIS — I209 Angina pectoris, unspecified: Secondary | ICD-10-CM

## 2018-05-09 ENCOUNTER — Ambulatory Visit: Payer: Medicare Other

## 2018-05-19 DIAGNOSIS — H401212 Low-tension glaucoma, right eye, moderate stage: Secondary | ICD-10-CM | POA: Diagnosis not present

## 2018-05-19 DIAGNOSIS — H401222 Low-tension glaucoma, left eye, moderate stage: Secondary | ICD-10-CM | POA: Diagnosis not present

## 2018-05-20 ENCOUNTER — Ambulatory Visit: Payer: Self-pay | Admitting: Sports Medicine

## 2018-06-03 ENCOUNTER — Ambulatory Visit: Payer: Self-pay

## 2018-06-13 ENCOUNTER — Other Ambulatory Visit: Payer: Self-pay | Admitting: Cardiology

## 2018-06-26 ENCOUNTER — Other Ambulatory Visit: Payer: Self-pay | Admitting: Obstetrics & Gynecology

## 2018-06-26 NOTE — Telephone Encounter (Signed)
Medication refill request: gabapentin  Last AEX:  09/20/17 SM Next AEX: 01/09/19  Last MMG (if hormonal medication request): 04/17/17 BIRADS1:Neg. appt 07/25/18 Refill authorized: 09/20/17 #[id number]. Today please advise.

## 2018-07-15 ENCOUNTER — Other Ambulatory Visit: Payer: Self-pay | Admitting: Cardiology

## 2018-07-18 ENCOUNTER — Ambulatory Visit
Admission: RE | Admit: 2018-07-18 | Discharge: 2018-07-18 | Disposition: A | Discharge status: Home / Self Care | Payer: Medicare Other | Source: Ambulatory Visit | Attending: Internal Medicine | Admitting: Internal Medicine

## 2018-07-18 ENCOUNTER — Other Ambulatory Visit: Payer: Self-pay

## 2018-07-18 DIAGNOSIS — Z1231 Encounter for screening mammogram for malignant neoplasm of breast: Secondary | ICD-10-CM

## 2018-07-25 ENCOUNTER — Ambulatory Visit: Payer: Self-pay

## 2018-07-25 DIAGNOSIS — M65331 Trigger finger, right middle finger: Secondary | ICD-10-CM | POA: Diagnosis not present

## 2018-07-25 DIAGNOSIS — M65332 Trigger finger, left middle finger: Secondary | ICD-10-CM | POA: Diagnosis not present

## 2018-07-29 ENCOUNTER — Encounter (HOSPITAL_BASED_OUTPATIENT_CLINIC_OR_DEPARTMENT_OTHER): Payer: Self-pay | Admitting: *Deleted

## 2018-07-29 ENCOUNTER — Other Ambulatory Visit: Payer: Self-pay

## 2018-07-29 ENCOUNTER — Telehealth: Payer: Self-pay

## 2018-07-29 ENCOUNTER — Other Ambulatory Visit: Payer: Self-pay | Admitting: Orthopedic Surgery

## 2018-07-29 NOTE — Telephone Encounter (Signed)
   Primary Cardiologist: No primary care provider on file.  Chart reviewed as part of pre-operative protocol coverage. Patient was contacted 07/29/2018 in reference to pre-operative risk assessment for pending surgery as outlined below.  Sarya Dalani Mette was last seen on 03/18/18 by Dr. Tamala Julian.  Since that day, Deanne Bedgood has done very well without acute chest pain.  occ use of sl NTG is present but recent tests are normal.  She may hold the ASA 3-5 days prior to procedure and resume afterwards.   Therefore, based on ACC/AHA guidelines, the patient would be at acceptable risk for the planned procedure without further cardiovascular testing.   I will route this recommendation to the requesting party via Epic fax function and remove from pre-op pool.  Please call with questions.  Cecilie Kicks, NP 07/29/2018, 4:50 PM

## 2018-07-29 NOTE — Telephone Encounter (Signed)
   [facility] Pre-operative Risk Assessment    Request for surgical clearance:  1. What type of surgery is being performed? Left finger trigger release   2. When is this surgery scheduled? 08/14/2018  3. What type of clearance is required (medical clearance vs. Pharmacy clearance to hold med vs. Both)? both  4. Are there any medications that need to be held prior to surgery and how long? Aspirin 81 mg   5. Practice name and name of physician performing surgery? Dr. Leanora Cover  6. What is your office phone [phone]   7.   What is your office fax [phone]  8.   Anesthesia type (None, local, MAC, general) ? Not lsited   Ludger Nutting Sidnee Gambrill 07/29/2018, 4:21 PM  _________________________________________________________________   (provider comments below)

## 2018-07-31 ENCOUNTER — Other Ambulatory Visit (HOSPITAL_COMMUNITY)
Admission: RE | Admit: 2018-07-31 | Discharge: 2018-07-31 | Disposition: A | Discharge status: Home / Self Care | Payer: Medicare Other | Source: Ambulatory Visit | Attending: Orthopedic Surgery | Admitting: Orthopedic Surgery

## 2018-07-31 DIAGNOSIS — Z1159 Encounter for screening for other viral diseases: Secondary | ICD-10-CM | POA: Insufficient documentation

## 2018-08-01 LAB — NOVEL CORONAVIRUS, NAA (HOSP ORDER, SEND-OUT TO REF LAB; TAT 18-24 HRS): SARS-CoV-2, NAA: NOT DETECTED

## 2018-08-04 ENCOUNTER — Other Ambulatory Visit: Payer: Self-pay

## 2018-08-04 ENCOUNTER — Ambulatory Visit (HOSPITAL_BASED_OUTPATIENT_CLINIC_OR_DEPARTMENT_OTHER)
Admission: RE | Admit: 2018-08-04 | Discharge: 2018-08-04 | Disposition: A | Discharge status: Home / Self Care | Payer: Medicare Other | Attending: Orthopedic Surgery | Admitting: Orthopedic Surgery

## 2018-08-04 ENCOUNTER — Encounter (HOSPITAL_BASED_OUTPATIENT_CLINIC_OR_DEPARTMENT_OTHER): Payer: Self-pay | Admitting: Emergency Medicine

## 2018-08-04 ENCOUNTER — Ambulatory Visit (HOSPITAL_BASED_OUTPATIENT_CLINIC_OR_DEPARTMENT_OTHER): Payer: Medicare Other | Admitting: Anesthesiology

## 2018-08-04 ENCOUNTER — Encounter (HOSPITAL_BASED_OUTPATIENT_CLINIC_OR_DEPARTMENT_OTHER)
Admission: RE | Disposition: A | Discharge status: Home / Self Care | Payer: Self-pay | Source: Home / Self Care | Attending: Orthopedic Surgery

## 2018-08-04 DIAGNOSIS — Z79899 Other long term (current) drug therapy: Secondary | ICD-10-CM | POA: Insufficient documentation

## 2018-08-04 DIAGNOSIS — K5909 Other constipation: Secondary | ICD-10-CM | POA: Insufficient documentation

## 2018-08-04 DIAGNOSIS — M1990 Unspecified osteoarthritis, unspecified site: Secondary | ICD-10-CM | POA: Insufficient documentation

## 2018-08-04 DIAGNOSIS — Z8572 Personal history of non-Hodgkin lymphomas: Secondary | ICD-10-CM | POA: Diagnosis not present

## 2018-08-04 DIAGNOSIS — M65332 Trigger finger, left middle finger: Secondary | ICD-10-CM | POA: Insufficient documentation

## 2018-08-04 DIAGNOSIS — E119 Type 2 diabetes mellitus without complications: Secondary | ICD-10-CM | POA: Insufficient documentation

## 2018-08-04 DIAGNOSIS — Z7982 Long term (current) use of aspirin: Secondary | ICD-10-CM | POA: Insufficient documentation

## 2018-08-04 DIAGNOSIS — D259 Leiomyoma of uterus, unspecified: Secondary | ICD-10-CM | POA: Diagnosis not present

## 2018-08-04 DIAGNOSIS — Z91041 Radiographic dye allergy status: Secondary | ICD-10-CM | POA: Diagnosis not present

## 2018-08-04 HISTORY — PX: TRIGGER FINGER RELEASE: SHX641

## 2018-08-04 SURGERY — RELEASE, A1 PULLEY, FOR TRIGGER FINGER
Anesthesia: Regional | Site: Hand | Laterality: Left

## 2018-08-04 MED ORDER — HYDROCODONE-ACETAMINOPHEN 5-325 MG PO TABS: ORAL_TABLET | ORAL | 0 refills | Status: DC

## 2018-08-04 MED ORDER — FENTANYL CITRATE (PF) 100 MCG/2ML IJ SOLN
INTRAMUSCULAR | Status: AC
  Filled 2018-08-04: qty 2

## 2018-08-04 MED ORDER — PROPOFOL 500 MG/50ML IV EMUL
INTRAVENOUS | Status: AC
  Filled 2018-08-04: qty 100

## 2018-08-04 MED ORDER — LACTATED RINGERS IV SOLN
INTRAVENOUS | Status: DC
  Administered 2018-08-04: 12:00:00 via INTRAVENOUS

## 2018-08-04 MED ORDER — CHLORHEXIDINE GLUCONATE 4 % EX LIQD: 60.0000 mL | Freq: Once | CUTANEOUS | Status: DC

## 2018-08-04 MED ORDER — LIDOCAINE HCL (CARDIAC) PF 100 MG/5ML IV SOSY
PREFILLED_SYRINGE | INTRAVENOUS | Status: DC | PRN
  Administered 2018-08-04: 50 mg via INTRAVENOUS

## 2018-08-04 MED ORDER — FENTANYL CITRATE (PF) 100 MCG/2ML IJ SOLN
50.0000 ug | INTRAMUSCULAR | Status: DC | PRN
  Administered 2018-08-04 (×2): 50 ug via INTRAVENOUS

## 2018-08-04 MED ORDER — MIDAZOLAM HCL 2 MG/2ML IJ SOLN: 1.0000 mg | INTRAMUSCULAR | Status: DC | PRN

## 2018-08-04 MED ORDER — CEFAZOLIN SODIUM-DEXTROSE 2-4 GM/100ML-% IV SOLN
INTRAVENOUS | Status: AC
  Filled 2018-08-04: qty 100

## 2018-08-04 MED ORDER — PROPOFOL 10 MG/ML IV BOLUS
INTRAVENOUS | Status: DC | PRN
  Administered 2018-08-04: 10 mg via INTRAVENOUS
  Administered 2018-08-04 (×2): 20 mg via INTRAVENOUS
  Administered 2018-08-04: 10 mg via INTRAVENOUS
  Administered 2018-08-04 (×2): 20 mg via INTRAVENOUS

## 2018-08-04 MED ORDER — SCOPOLAMINE 1 MG/3DAYS TD PT72: 1.0000 | MEDICATED_PATCH | Freq: Once | TRANSDERMAL | Status: DC | PRN

## 2018-08-04 MED ORDER — BUPIVACAINE HCL (PF) 0.25 % IJ SOLN
INTRAMUSCULAR | Status: AC
  Filled 2018-08-04: qty 30

## 2018-08-04 MED ORDER — BUPIVACAINE HCL (PF) 0.25 % IJ SOLN
INTRAMUSCULAR | Status: DC | PRN
  Administered 2018-08-04: 8 mL

## 2018-08-04 MED ORDER — CEFAZOLIN SODIUM-DEXTROSE 2-4 GM/100ML-% IV SOLN
2.0000 g | INTRAVENOUS | Status: AC
  Administered 2018-08-04: 13:00:00 2 g via INTRAVENOUS

## 2018-08-04 SURGICAL SUPPLY — 33 items
BLADE SURG 15 STRL LF DISP TIS (BLADE) ×2 IMPLANT
BLADE SURG 15 STRL SS (BLADE) ×4
BNDG COHESIVE 2X5 TAN STRL LF (GAUZE/BANDAGES/DRESSINGS) ×3 IMPLANT
BNDG ESMARK 4X9 LF (GAUZE/BANDAGES/DRESSINGS) IMPLANT
CHLORAPREP W/TINT 26 (MISCELLANEOUS) ×3 IMPLANT
CORD BIPOLAR FORCEPS 12FT (ELECTRODE) ×3 IMPLANT
COVER BACK TABLE REUSABLE LG (DRAPES) ×3 IMPLANT
COVER MAYO STAND REUSABLE (DRAPES) ×3 IMPLANT
COVER WAND RF STERILE (DRAPES) IMPLANT
CUFF TOURN SGL QUICK 18X4 (TOURNIQUET CUFF) ×3 IMPLANT
DRAPE EXTREMITY T 121X128X90 (DISPOSABLE) ×3 IMPLANT
DRAPE SURG 17X23 STRL (DRAPES) ×3 IMPLANT
GAUZE SPONGE 4X4 12PLY STRL (GAUZE/BANDAGES/DRESSINGS) ×3 IMPLANT
GAUZE XEROFORM 1X8 LF (GAUZE/BANDAGES/DRESSINGS) ×3 IMPLANT
GLOVE BIO SURGEON STRL SZ 6.5 (GLOVE) ×2 IMPLANT
GLOVE BIO SURGEON STRL SZ7.5 (GLOVE) ×3 IMPLANT
GLOVE BIO SURGEONS STRL SZ 6.5 (GLOVE) ×1
GLOVE BIOGEL PI IND STRL 7.0 (GLOVE) ×2 IMPLANT
GLOVE BIOGEL PI IND STRL 8 (GLOVE) ×1 IMPLANT
GLOVE BIOGEL PI INDICATOR 7.0 (GLOVE) ×4
GLOVE BIOGEL PI INDICATOR 8 (GLOVE) ×2
GOWN STRL REUS W/ TWL LRG LVL3 (GOWN DISPOSABLE) ×1 IMPLANT
GOWN STRL REUS W/TWL LRG LVL3 (GOWN DISPOSABLE) ×2
GOWN STRL REUS W/TWL XL LVL3 (GOWN DISPOSABLE) ×3 IMPLANT
NEEDLE HYPO 25X1 1.5 SAFETY (NEEDLE) ×3 IMPLANT
NS IRRIG 1000ML POUR BTL (IV SOLUTION) ×3 IMPLANT
PACK BASIN DAY SURGERY FS (CUSTOM PROCEDURE TRAY) ×3 IMPLANT
STOCKINETTE 4X48 STRL (DRAPES) ×3 IMPLANT
SUT ETHILON 4 0 PS 2 18 (SUTURE) ×3 IMPLANT
SYR BULB 3OZ (MISCELLANEOUS) ×3 IMPLANT
SYR CONTROL 10ML LL (SYRINGE) ×3 IMPLANT
TOWEL GREEN STERILE FF (TOWEL DISPOSABLE) ×6 IMPLANT
UNDERPAD 30X30 (UNDERPADS AND DIAPERS) ×3 IMPLANT

## 2018-08-04 NOTE — H&P (Signed)
Deborah Fisher is an 68 y.o. female.   Chief Complaint: left long finger trigger digit HPI: 68 yo female with triggering left long finger.  This has been injected without lasting resolution.  She wishes to have a trigger release.  Allergies:  Allergies  Allergen Reactions  . Iodinated Diagnostic Agents Other (See Comments)     Warm feeling once    Past Medical History:  Diagnosis Date  . Bursitis    left hip   . Chronic constipation   . DI (detrusor instability)   . Diabetes ([place])    pre-diabetes, controlling with diet and exercise  . Femur fracture ([place]) 2011  . Fibroid   . Headache   . Menopausal symptoms   . Non-Hodgkin lymphoma ([place]) 1992   treated with CHOP  . Osteoporosis    h/o 3 stress fractures  . Postmenopausal HRT (hormone replacement therapy)   . Tennis elbow Right   . Trigger finger    bilateral     Past Surgical History:  Procedure Laterality Date  . Biopsy for Non Hodgkins Lymphoma  1992  . DILATION AND CURETTAGE OF UTERUS    . PELVIC LAPAROSCOPY    . TOE SURGERY      Family History: Family History  Problem Relation Age of Onset  . Diabetes Mother 34  . Hypertension Mother   . Alzheimer's disease Mother   . Glaucoma Mother   . Heart disease Mother   . Hypertension Father   . Alcoholism Father        deceased age 55  . Cirrhosis Father   . Diabetes Brother   . Hypertension Brother   . Colon cancer Maternal Uncle   . Heart disease Maternal Grandmother   . Breast cancer Cousin        Mat. 1st cousin-Age 48  . Breast cancer Maternal Aunt        Age 35's  . Heart disease Maternal Aunt   . Other Paternal Grandmother        MI    Social History:   reports that she has never smoked. She has never used smokeless tobacco. She reports current alcohol use of about 2.0 standard drinks of alcohol per week. She reports that she does not use drugs.  Medications: Medications Prior to Admission  Medication Sig Dispense Refill  . aspirin 81 MG  EC tablet TAKE 1 TABLET BY MOUTH EVERY DAY 30 tablet 1  . Calcium Carbonate-Vitamin D (CALCIUM + D PO) Take 1 tablet by mouth 2 (two) times daily.     . Cetirizine HCl (ZYRTEC ALLERGY PO) Take 1 tablet by mouth daily as needed.     . Cholecalciferol (VITAMIN D PO) Take 1 tablet by mouth 2 (two) times daily.     . citalopram (CELEXA) 20 MG tablet Take 1 tablet (20 mg total) by mouth daily. 90 tablet 4  . Cyanocobalamin (VITAMIN B 12 PO) Take 1 tablet by mouth daily.     Marland Kitchen diltiazem (CARDIZEM CD) 180 MG 24 hr capsule TAKE 1 CAPSULE BY MOUTH EVERY DAY 90 capsule 1  . gabapentin (NEURONTIN) 100 MG capsule TAKE 3 CAPSULES NIGHTLY AND 2 CAPSULES TWICE DURING THE DAY. CONTINUE TAKING SEPERATE RX 210 capsule 2  . Multiple Vitamin (MULTIVITAMIN) tablet Take 1 tablet by mouth daily.    . pravastatin (PRAVACHOL) 20 MG tablet TAKE 1 TABLET BY MOUTH EVERY DAY IN THE EVENING 90 tablet 1  . rizatriptan (MAXALT-MLT) 10 MG disintegrating tablet Take 10 mg  by mouth as needed.     . nitroGLYCERIN (NITROSTAT) 0.4 MG SL tablet Place 1 tablet (0.4 mg total) under the tongue every 5 (five) minutes as needed for chest pain. 25 tablet 11    No results found for this or any previous visit (from the past 48 hour(s)).  No results found.   A comprehensive review of systems was negative.  Blood pressure (!) 102/59, pulse (!) 54, temperature 98.1 F (36.7 C), temperature source Oral, resp. rate 16, height 5\' 5"  (1.651 m), weight 78.7 kg, last menstrual period 11/04/1990, SpO2 100 %.  General appearance: alert, cooperative and appears stated age Head: Normocephalic, without obvious abnormality, atraumatic Neck: supple, symmetrical, trachea midline Cardio: regular rate and rhythm Resp: clear to auscultation bilaterally Extremities: Intact sensation and capillary refill all digits.  +epl/fpl/io.  No wounds.  Pulses: 2+ and symmetric Skin: Skin color, texture, turgor normal. No rashes or lesions Neurologic: Grossly  normal Incision/Wound: none  Assessment/Plan Left long finger trigger release.  Non operative and operative treatment options have been discussed with the patient and patient wishes to proceed with operative treatment. Risks, benefits, and alternatives of surgery have been discussed and the patient agrees with the plan of care.   Leanora Cover 08/04/2018, 1:04 PM

## 2018-08-04 NOTE — Anesthesia Preprocedure Evaluation (Signed)
Anesthesia Evaluation  Patient identified by MRN, date of birth, ID band Patient awake    Reviewed: Allergy & Precautions, NPO status , Patient's Chart, lab work & pertinent test results  History of Anesthesia Complications Negative for: history of anesthetic complications  Airway Mallampati: II  TM Distance: >3 FB Neck ROM: Full    Dental no notable dental hx.    Pulmonary neg pulmonary ROS,    Pulmonary exam normal        Cardiovascular negative cardio ROS Normal cardiovascular exam     Neuro/Psych negative neurological ROS  negative psych ROS   GI/Hepatic negative GI ROS, Neg liver ROS,   Endo/Other  diabetes, Well Controlled  Renal/GU negative Renal ROS  negative genitourinary   Musculoskeletal  (+) Arthritis ,   Abdominal   Peds  Hematology negative hematology ROS (+)   Anesthesia Other Findings H/o non-Hodgkin lymphoma (1992)  Reproductive/Obstetrics                            Anesthesia Physical Anesthesia Plan  ASA: II  Anesthesia Plan: Bier Block and Bier Block-LIDOCAINE ONLY   Post-op Pain Management:    Induction:   PONV Risk Score and Plan: 2 and Propofol infusion, Treatment may vary due to age or medical condition and Ondansetron  Airway Management Planned: Natural Airway and Simple Face Mask  Additional Equipment: None  Intra-op Plan:   Post-operative Plan:   Informed Consent: I have reviewed the patients History and Physical, chart, labs and discussed the procedure including the risks, benefits and alternatives for the proposed anesthesia with the patient or authorized representative who has indicated his/her understanding and acceptance.       Plan Discussed with:   Anesthesia Plan Comments:         Anesthesia Quick Evaluation

## 2018-08-04 NOTE — Anesthesia Procedure Notes (Signed)
Anesthesia Regional Block: Bier block (IV Regional)   Pre-Anesthetic Checklist: ,, timeout performed, Correct Patient, Correct Site, Correct Laterality, Correct Procedure,, site marked, surgical consent,, at surgeon's request  Laterality: Left     Needles:  Injection technique: Single-shot  Needle Type: Other      Needle Gauge: 20     Additional Needles:   Procedures:,,,,, intact distal pulses, Esmarch exsanguination, single tourniquet utilized,  Narrative:  Start time: 08/04/2018 1:31 PM End time: 08/04/2018 1:32 PM Injection made incrementally with aspirations every 35 mL.  Performed by: Personally

## 2018-08-04 NOTE — Anesthesia Procedure Notes (Signed)
Procedure Name: MAC Date/Time: 08/04/2018 1:41 PM Performed by: Lieutenant Diego, CRNA Pre-anesthesia Checklist: Patient identified, Emergency Drugs available, Suction available, Patient being monitored and Timeout performed Patient Re-evaluated:Patient Re-evaluated prior to induction Oxygen Delivery Method: Simple face mask Preoxygenation: Pre-oxygenation with 100% oxygen

## 2018-08-04 NOTE — Anesthesia Postprocedure Evaluation (Signed)
Anesthesia Post Note  Patient: Deborah Fisher  Procedure(s) Performed: RELEASE TRIGGER FINGER/A-1 PULLEY (Left Hand)     Patient location during evaluation: PACU Anesthesia Type: Bier Block and General Level of consciousness: sedated Pain management: pain level controlled Vital Signs Assessment: post-procedure vital signs reviewed and stable Respiratory status: spontaneous breathing and respiratory function stable Cardiovascular status: stable Postop Assessment: no apparent nausea or vomiting Anesthetic complications: no    Last Vitals:  Vitals:   08/04/18 1430 08/04/18 1502  BP: (!) 164/74 (!) 141/75  Pulse: (!) 47 (!) 45  Resp: 19 18  Temp:  36.6 C  SpO2: 99% 99%    Last Pain:  Vitals:   08/04/18 1502  TempSrc:   PainSc: 0-No pain                 Keval Nam DANIEL

## 2018-08-04 NOTE — Op Note (Signed)
08/04/2018 [facility]  Operative Note  PREOPERATIVE DIAGNOSIS: LEFT LONG FINGER TRIGGER DIGIT  POSTOPERATIVE DIAGNOSIS:  LEFT LONG FINGER TRIGGER DIGIT  PROCEDURE: Procedure(s): RELEASE TRIGGER FINGER/A-1 PULLEY  SURGEON:  Leanora Cover, MD  ASSISTANT:  none.  ANESTHESIA:  Bier block with sedation.  IV FLUIDS:  Per anesthesia flow sheet.  ESTIMATED BLOOD LOSS:  Minimal.  COMPLICATIONS:  None.  SPECIMENS:  None.  TOURNIQUET TIME:  Total Tourniquet Time Documented: Forearm (Left) - 25 minutes Total: Forearm (Left) - 25 minutes   DISPOSITION:  Stable to PACU.  LOCATION: [facility]  INDICATIONS: Deborah Fisher is a 68 y.o. female with triggering left long finger.  This has recurred after injections.  She wishes to have a trigger digit release.  Risks, benefits and alternatives of surgery were discussed including the risk of blood loss, infection, damage to nerves, vessels, tendons, ligaments, bone, failure of surgery, need for additional surgery, complications with wound healing, continued pain, continued triggering and need for repeat surgery.  She voiced understanding of these risks and elected to proceed.  OPERATIVE COURSE:  After being identified preoperatively by myself, the patient and I agreed upon the procedure and site of procedure.  The surgical site was marked. The risks, benefits, and alternatives of surgery were reviewed and she wished to proceed.  Surgical consent had been signed. She was given IV Ancef as preoperative antibiotic prophylaxis. She was transported to the operating room and placed on the operating room table in supine position with the Left upper extremity on an arm board. Bier block anesthesia was induced by the anesthesiologist.  The Left upper extremity was prepped and draped in normal sterile orthopedic fashion. A surgical pause was performed between surgeons, anesthesia, and operating room staff, and all were in agreement  as to the patient, procedure, and site of procedure.  Tourniquet at the proximal aspect of the forearm had been inflated for the Bier block.  An incision was made at the volar aspect of the MP joint of the long finger.  This was carried into the subcutaneous tissues by preading technique.  Bipolar electrocautery was used to obtain hemostasis.  The radial and ulnar digital nerves were protected throughout the case. The flexor sheath was identified.  The A1 pulley was identified and sharply incised.  It was released in its entirety.  The proximal 1-2 mm of the A2 pulley was vented to allow better excursion of the tendons.  The finger was placed through a range of motion and there was noted to be no catching.  The tendons were brought through the wound and any adherences released.  The wound was then copiously irrigated with sterile saline. It was closed with 4-0 nylon in a horizontal mattress fashion.  It was injected with 0.25% plain Marcaine to aid in postoperative analgesia.  It was dressed with sterile Xeroform, 4x4s, and wrapped lightly with a Coban dressing.  Tourniquet was deflated at 25 minutes.  The fingertips were pink with brisk capillary refill after deflation of the tourniquet.  The operative drapes were broken down and the patient was awoken from anesthesia safely.  She was transferred back to the stretcher and taken to the PACU in stable condition.   I will see her back in the office in 1 week for postoperative followup.  I will give her a prescription for Norco 5/325 1-2 tabs PO q6 hours prn pain, dispense # 10.    Leanora Cover, MD Electronically signed, 08/04/18

## 2018-08-04 NOTE — Transfer of Care (Signed)
Immediate Anesthesia Transfer of Care Note  Patient: Deborah Fisher  Procedure(s) Performed: RELEASE TRIGGER FINGER/A-1 PULLEY (Left Hand)  Patient Location: PACU  Anesthesia Type:MAC and Bier block  Level of Consciousness: awake, alert  and oriented  Airway & Oxygen Therapy: Patient Spontanous Breathing and Patient connected to face mask oxygen  Post-op Assessment: Report given to RN and Post -op Vital signs reviewed and stable  Post vital signs: Reviewed and stable  Last Vitals:  Vitals Value Taken Time  BP    Temp    Pulse 44 08/04/2018  2:01 PM  Resp 14 08/04/2018  2:01 PM  SpO2 100 % 08/04/2018  2:01 PM  Vitals shown include unvalidated device data.  Last Pain:  Vitals:   08/04/18 1209  TempSrc: Oral  PainSc: 0-No pain         Complications: No apparent anesthesia complications

## 2018-08-04 NOTE — Discharge Instructions (Addendum)

## 2018-08-05 ENCOUNTER — Encounter (HOSPITAL_BASED_OUTPATIENT_CLINIC_OR_DEPARTMENT_OTHER): Payer: Self-pay | Admitting: Orthopedic Surgery

## 2018-08-11 DIAGNOSIS — M79671 Pain in right foot: Secondary | ICD-10-CM | POA: Diagnosis not present

## 2018-08-14 ENCOUNTER — Ambulatory Visit (INDEPENDENT_AMBULATORY_CARE_PROVIDER_SITE_OTHER): Payer: Medicare Other | Admitting: Sports Medicine

## 2018-08-14 ENCOUNTER — Encounter: Payer: Self-pay | Admitting: Sports Medicine

## 2018-08-14 ENCOUNTER — Other Ambulatory Visit: Payer: Self-pay

## 2018-08-14 VITALS — BP 122/72 | Ht 65.0 in | Wt 165.0 lb

## 2018-08-14 DIAGNOSIS — R269 Unspecified abnormalities of gait and mobility: Secondary | ICD-10-CM

## 2018-08-14 DIAGNOSIS — M25579 Pain in unspecified ankle and joints of unspecified foot: Secondary | ICD-10-CM

## 2018-08-14 DIAGNOSIS — I209 Angina pectoris, unspecified: Secondary | ICD-10-CM | POA: Diagnosis not present

## 2018-08-14 NOTE — Progress Notes (Signed)
  Deborah Fisher - 68 y.o. female MRN [id number]  Date of birth: [birth year]    SUBJECTIVE:      Chief Complaint: Orthotics  HPI:  68 year old female presents for creation of orthotics.  She is an avid hiker and has been continuing 10-12 mile hikes.  Patient reports having recent right midfoot pain for which she is being evaluated for possible stress fracture.  She is awaiting MRI of her foot and is following up with [facility] for this.   ROS:     See HPI. All other reviewed systems negative.  PERTINENT  PMH / PSH FH / / SH:  Past Medical, Surgical, Social, and Family History Reviewed & Updated in the EMR.    OBJECTIVE: BP 122/72   Ht 5\' 5"  (1.651 m)   Wt 165 lb (74.8 kg)   LMP 11/04/1990   BMI 27.46 kg/m   Physical Exam:  Vital signs are reviewed.  GEN: Alert and oriented, NAD Pulm: Breathing unlabored PSY: normal mood, congruent affect  MSK: bilateral feet: Inspection:  Normal arch. Pt gait demonstrates external rotation of the forefoot which causes dynamic pronation Lateral column breakdown Rt toes 4 and 5 webbed TTP over MTP 1 but also over navicular No swelling ROM: Full ROM of the ankle. Normal midfoot flexibility Strength: 5/5 strength ankle in all planes Neurovascular: N/V intact distally in the lower extremity Special tests: Negative anterior drawer. Negative squeeze, no mulder's click. Normal calcaneal motion with heel raise   Patient was fitted for a : standard, cushioned, semi-rigid orthotic. The orthotic was heated and afterward the patient stood on the orthotic blank positioned on the orthotic stand. The patient was positioned in subtalar neutral position and 10 degrees of ankle dorsiflexion in a weight bearing stance. After completion of molding, a stable base was applied to the orthotic blank. The blank was ground to a stable position for weight bearing. Size: 9 Base: blue EVA Posting: none. We removed the previous lateral ray post due to  causing possible overpronation Additional orthotic padding: MT pads    ASSESSMENT & PLAN:  1. Pt here today for custom orthotics. Currently being seen at [facility] for possible stress fracture/reaction. She will forward MRI report here when completed.  Total time spent with the patient was 25 minutes with greater than 50% of the time spent in face-to-face consultation discussing orthotic construction, instruction, and sitting. Gait was neutral with orthotics in place. Patient found them to be comfortable. Follow-up as needed.  I observed and examined the patient with the Dr Okey Dupre and agree with assessment and plan.  Note reviewed and modified by me. KB fields, MD

## 2018-08-15 DIAGNOSIS — M79671 Pain in right foot: Secondary | ICD-10-CM | POA: Diagnosis not present

## 2018-08-18 DIAGNOSIS — M79671 Pain in right foot: Secondary | ICD-10-CM | POA: Diagnosis not present

## 2018-08-26 DIAGNOSIS — M79671 Pain in right foot: Secondary | ICD-10-CM | POA: Diagnosis not present

## 2018-09-08 DIAGNOSIS — M810 Age-related osteoporosis without current pathological fracture: Secondary | ICD-10-CM | POA: Diagnosis not present

## 2018-09-08 DIAGNOSIS — E7849 Other hyperlipidemia: Secondary | ICD-10-CM | POA: Diagnosis not present

## 2018-09-08 DIAGNOSIS — R7301 Impaired fasting glucose: Secondary | ICD-10-CM | POA: Diagnosis not present

## 2018-09-09 DIAGNOSIS — R82998 Other abnormal findings in urine: Secondary | ICD-10-CM | POA: Diagnosis not present

## 2018-09-21 ENCOUNTER — Other Ambulatory Visit: Payer: Self-pay | Admitting: Obstetrics & Gynecology

## 2018-09-22 ENCOUNTER — Other Ambulatory Visit: Payer: Self-pay | Admitting: Cardiology

## 2018-09-22 DIAGNOSIS — M79671 Pain in right foot: Secondary | ICD-10-CM | POA: Diagnosis not present

## 2018-09-22 DIAGNOSIS — I201 Angina pectoris with documented spasm: Secondary | ICD-10-CM | POA: Diagnosis not present

## 2018-09-22 DIAGNOSIS — E785 Hyperlipidemia, unspecified: Secondary | ICD-10-CM | POA: Diagnosis not present

## 2018-09-22 DIAGNOSIS — Z1339 Encounter for screening examination for other mental health and behavioral disorders: Secondary | ICD-10-CM | POA: Diagnosis not present

## 2018-09-22 DIAGNOSIS — Z0000 Encounter for general adult medical examination without abnormal findings: Secondary | ICD-10-CM | POA: Diagnosis not present

## 2018-09-22 DIAGNOSIS — M810 Age-related osteoporosis without current pathological fracture: Secondary | ICD-10-CM | POA: Diagnosis not present

## 2018-09-22 DIAGNOSIS — R5383 Other fatigue: Secondary | ICD-10-CM | POA: Diagnosis not present

## 2018-09-22 DIAGNOSIS — I209 Angina pectoris, unspecified: Secondary | ICD-10-CM

## 2018-09-22 DIAGNOSIS — Z1331 Encounter for screening for depression: Secondary | ICD-10-CM | POA: Diagnosis not present

## 2018-09-22 DIAGNOSIS — Z8579 Personal history of other malignant neoplasms of lymphoid, hematopoietic and related tissues: Secondary | ICD-10-CM | POA: Diagnosis not present

## 2018-09-22 DIAGNOSIS — R7301 Impaired fasting glucose: Secondary | ICD-10-CM | POA: Diagnosis not present

## 2018-09-22 NOTE — Telephone Encounter (Signed)
Medication refill request: Gabapentin  Last AEX:  09/20/17 Next AEX: 01/09/19 Last MMG (if hormonal medication request): 07/21/18 Bi-rads 1 neg  Refill authorized: #[id number] with 1 RF

## 2018-10-01 DIAGNOSIS — M25642 Stiffness of left hand, not elsewhere classified: Secondary | ICD-10-CM | POA: Diagnosis not present

## 2018-10-01 DIAGNOSIS — M65332 Trigger finger, left middle finger: Secondary | ICD-10-CM | POA: Diagnosis not present

## 2018-10-07 ENCOUNTER — Ambulatory Visit: Payer: Medicare Other | Admitting: Sports Medicine

## 2018-10-15 ENCOUNTER — Other Ambulatory Visit: Payer: Self-pay | Admitting: Obstetrics & Gynecology

## 2018-10-15 NOTE — Telephone Encounter (Signed)
Medication refill request: Celexa 20 mg  Last AEX:  09/20/17 Next AEX: 01/09/19 Last MMG (if hormonal medication request): 07/18/18  Bi-rads 1 neg  Refill authorized: #[id number] with 0 RF to get her to her AEX

## 2018-10-27 DIAGNOSIS — Z23 Encounter for immunization: Secondary | ICD-10-CM | POA: Diagnosis not present

## 2018-11-13 ENCOUNTER — Telehealth: Payer: Self-pay | Admitting: Obstetrics & Gynecology

## 2018-11-13 NOTE — Telephone Encounter (Signed)
Spoke with patient. Patient is on Gabapentin 700 mg daily in divided doses and citalopram 20 mg daily. Has been on medication for approximately 2 yrs. Patient reports steady weight gain despite being active. Is afraid to weigh herself, can tell by her clothes. Is unsure if gabapentin is still helping with hot flashes. Denies any lifestyle changes.   Patient is requesting to wean off of gabapentin, wants instructions. AEX with Dr. Sabra Heck on 01/09/19, will plan to discuss alternatives at that time. Advised patient I will review with Dr. Sabra Heck and return call with recommendations, patient agreeable.   Dr. Sabra Heck -please advise on weaning from Gabapentin.

## 2018-11-13 NOTE — Telephone Encounter (Signed)
Patient is calling regarding side effects from gabapentin. Patient stated that she believes she is experiencing weight gain and would like to taper off, but is wanting to do so safely. Patient stated that the medication is not helping with hot flashes and she is "upset" by the weight gain.

## 2018-11-14 NOTE — Telephone Encounter (Signed)
Call returned to patient, left detailed message, ok per dpr. Advised as seen below per Dr. Sabra Heck. Advised to return call to office if any additional questions or concerns.   Encounter closed.

## 2018-11-14 NOTE — Telephone Encounter (Signed)
She can decrease by 100mg  every 4 days until she is off.  She can decide which dosages to decrease with each interval.

## 2018-12-10 DIAGNOSIS — H401222 Low-tension glaucoma, left eye, moderate stage: Secondary | ICD-10-CM | POA: Diagnosis not present

## 2018-12-10 DIAGNOSIS — H401212 Low-tension glaucoma, right eye, moderate stage: Secondary | ICD-10-CM | POA: Diagnosis not present

## 2018-12-14 ENCOUNTER — Other Ambulatory Visit: Payer: Self-pay | Admitting: Interventional Cardiology

## 2018-12-24 DIAGNOSIS — M19042 Primary osteoarthritis, left hand: Secondary | ICD-10-CM | POA: Diagnosis not present

## 2018-12-24 DIAGNOSIS — M65312 Trigger thumb, left thumb: Secondary | ICD-10-CM | POA: Diagnosis not present

## 2018-12-24 DIAGNOSIS — M65322 Trigger finger, left index finger: Secondary | ICD-10-CM | POA: Diagnosis not present

## 2018-12-24 DIAGNOSIS — M19041 Primary osteoarthritis, right hand: Secondary | ICD-10-CM | POA: Diagnosis not present

## 2019-01-06 ENCOUNTER — Other Ambulatory Visit: Payer: Self-pay | Admitting: Obstetrics and Gynecology

## 2019-01-06 NOTE — Telephone Encounter (Signed)
Medication refill request:Celexia  Last AEX:  09/20/17 Next AEX: 01/09/19 Last MMG (if hormonal medication request): 07/21/18 Bi-rads 1 neg  Refill authorized: #[id number] with 0 refills

## 2019-01-07 ENCOUNTER — Other Ambulatory Visit: Payer: Self-pay

## 2019-01-07 DIAGNOSIS — Z20822 Contact with and (suspected) exposure to covid-19: Secondary | ICD-10-CM

## 2019-01-08 LAB — NOVEL CORONAVIRUS, NAA: SARS-CoV-2, NAA: NOT DETECTED

## 2019-01-09 ENCOUNTER — Ambulatory Visit: Payer: Medicare Other | Admitting: Obstetrics & Gynecology

## 2019-01-14 ENCOUNTER — Other Ambulatory Visit: Payer: Self-pay | Admitting: Interventional Cardiology

## 2019-01-14 NOTE — Telephone Encounter (Signed)
Ok to fill 

## 2019-01-14 NOTE — Telephone Encounter (Signed)
Pt calling requesting a refill on pravastatin. Would Dr. Tamala Julian like to refill this medication? Please address

## 2019-01-15 MED ORDER — PRAVASTATIN SODIUM 20 MG PO TABS: ORAL_TABLET | ORAL | 0 refills | Status: DC

## 2019-01-26 DIAGNOSIS — G43809 Other migraine, not intractable, without status migrainosus: Secondary | ICD-10-CM | POA: Diagnosis not present

## 2019-01-26 DIAGNOSIS — H401212 Low-tension glaucoma, right eye, moderate stage: Secondary | ICD-10-CM | POA: Diagnosis not present

## 2019-01-26 DIAGNOSIS — H2513 Age-related nuclear cataract, bilateral: Secondary | ICD-10-CM | POA: Diagnosis not present

## 2019-01-26 DIAGNOSIS — H401222 Low-tension glaucoma, left eye, moderate stage: Secondary | ICD-10-CM | POA: Diagnosis not present

## 2019-02-03 DIAGNOSIS — M65312 Trigger thumb, left thumb: Secondary | ICD-10-CM | POA: Diagnosis not present

## 2019-02-03 DIAGNOSIS — G5601 Carpal tunnel syndrome, right upper limb: Secondary | ICD-10-CM | POA: Diagnosis not present

## 2019-02-03 DIAGNOSIS — M65322 Trigger finger, left index finger: Secondary | ICD-10-CM | POA: Diagnosis not present

## 2019-02-12 DIAGNOSIS — M545 Low back pain: Secondary | ICD-10-CM | POA: Diagnosis not present

## 2019-02-12 DIAGNOSIS — M25561 Pain in right knee: Secondary | ICD-10-CM | POA: Diagnosis not present

## 2019-03-03 ENCOUNTER — Telehealth: Payer: Self-pay | Admitting: Obstetrics & Gynecology

## 2019-03-03 NOTE — Telephone Encounter (Signed)
Patient is asking for a virtual visit with Dr.Miller to discuss the side effects of some of her medications.

## 2019-03-03 NOTE — Telephone Encounter (Signed)
After being off HRT, she has new risks of MI and stroke and at 38, I do not feel comfortable restarting her HRT.  I am sorry.

## 2019-03-03 NOTE — Telephone Encounter (Signed)
Spoke to pt. Pt wanting to discuss with Dr Sabra Heck about her current regimen of taking gabapentin and Celexa together for her hot flashes and is not working well and would like to go back to HRT. Was taken off due to current insurance.  Pt states will have new insurance starting Jan 1 of [employer] and [place] and they cover HRT. Pt wanting to restart low dose of Prempro again. Pt requests video visit through mychart , schedule with Dr Sabra Heck on 03/16/2019 at 4:30pm. Pt agreeable.   Will route to Dr Sabra Heck for review and will close encounter.

## 2019-03-08 NOTE — Progress Notes (Signed)
Cardiology Office Note:    Date:  03/09/2019   ID:  Deborah Fisher, DOB [birth year], MRN [id number]  PCP:  Marton Redwood, MD  Cardiologist:  No primary care provider on file.   Referring MD: Marton Redwood, MD   Chief Complaint  Patient presents with  . Chest Pain  . Advice Only    Microvascular angina    History of Present Illness:    Deborah Fisher is a 69 y.o. female with a hx of non Hodgkin's lymphoma referred by Dr. Brigitte Fisher for evaluation of chest pain.  Prior cardiac evaluation by Dr. Bettina Fisher suggest the possibility of syndrome X / microvascular angina.  In 6 months, Dr. Abby Fisher has had 2 episodes of angina relieved by nitroglycerin.  She is quick to use nitroglycerin if she starts having any chest discomfort that is familiar to her from the past.  She states that Dr. Brigitte Fisher and I decided to stop diltiazem.  I do not recall the decision but all of therapy, there has been no change in episodes of angina.  Her major complaint now is that she feels fatigued all the time.  She thinks it is related to her combination of medications.  Past Medical History:  Diagnosis Date  . Bursitis    left hip   . Chronic constipation   . DI (detrusor instability)   . Diabetes ([place])    pre-diabetes, controlling with diet and exercise  . Femur fracture ([place]) 2011  . Fibroid   . Headache   . Menopausal symptoms   . Non-Hodgkin lymphoma ([place]) 1992   treated with CHOP  . Osteoporosis    h/o 3 stress fractures  . Postmenopausal HRT (hormone replacement therapy)   . Tennis elbow Right   . Trigger finger    bilateral     Past Surgical History:  Procedure Laterality Date  . Biopsy for Non Hodgkins Lymphoma  1992  . DILATION AND CURETTAGE OF UTERUS    . PELVIC LAPAROSCOPY    . TOE SURGERY    . TRIGGER FINGER RELEASE Left 08/04/2018   Procedure: RELEASE TRIGGER FINGER/A-1 PULLEY;  Surgeon: Leanora Cover, MD;  Location: [facility];  Service: Orthopedics;  Laterality: Left;    Current  Medications: Current Meds  Medication Sig  . aspirin 81 MG EC tablet TAKE 1 TABLET BY MOUTH EVERY DAY  . brimonidine (ALPHAGAN) 0.2 % ophthalmic solution 1 drop 2 (two) times daily.  . Calcium Carbonate-Vitamin D (CALCIUM + D PO) Take 1 tablet by mouth 2 (two) times daily.   . celecoxib (CELEBREX) 200 MG capsule Take 200 mg by mouth 2 (two) times daily.  . Cetirizine HCl (ZYRTEC ALLERGY PO) Take 1 tablet by mouth daily as needed.   . Cholecalciferol (VITAMIN D PO) Take 1 tablet by mouth 2 (two) times daily.   . citalopram (CELEXA) 20 MG tablet TAKE 1 TABLET BY MOUTH EVERY DAY  . Cyanocobalamin (VITAMIN B 12 PO) Take 1 tablet by mouth daily.   Marland Kitchen diltiazem (CARDIZEM CD) 180 MG 24 hr capsule Take 1 capsule (180 mg total) by mouth daily. Please make yearly appt with Dr. Tamala Fisher for January before anymore refills. 1st attempt  . gabapentin (NEURONTIN) 100 MG capsule TAKE 2 CAPSULES IN THE AM, 2 CAPSULES IN THE AFTERNOON, AND THEN THREE AT NIGHT.  Marland Kitchen HYDROcodone-acetaminophen (NORCO) 5-325 MG tablet 1-2 tabs po q6 hours prn pain  . Multiple Vitamin (MULTIVITAMIN) tablet Take 1 tablet by mouth daily.  . nitroGLYCERIN (NITROSTAT)  0.4 MG SL tablet Place 1 tablet (0.4 mg total) under the tongue every 5 (five) minutes as needed for chest pain.  . pravastatin (PRAVACHOL) 20 MG tablet TAKE 1 TABLET BY MOUTH EVERY DAY IN THE EVENING. Please keep upcoming appt in January with Dr. Tamala Fisher before anymore refills. Thank you  . rizatriptan (MAXALT-MLT) 10 MG disintegrating tablet Take 10 mg by mouth as needed.      Allergies:   Iodinated diagnostic agents   Social History   Socioeconomic History  . Marital status: Single    Spouse name: Not on file  . Number of children: 0  . Years of education: PhD  . 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 Smoking status: Never Smoker  . Smokeless tobacco: Never Used  Substance  and Sexual Activity  . Alcohol use: Yes    Alcohol/week: 2.0 standard drinks    Types: 2 Glasses of wine per week    Comment: social  . Drug use: No  . Sexual activity: Not Currently    Partners: Male    Birth control/protection: Post-menopausal  Other Topics Concern  . Not on file  Social History Narrative  . Not on file   Social Determinants of Health   Financial Resource Strain:   . Difficulty of Paying Living Expenses: Not on file  Food Insecurity:   . Worried About Charity fundraiser in the Last Year: Not on file  . Ran Out of Food in the Last Year: Not on file  Transportation Needs:   . Lack of Transportation (Medical): Not on file  . Lack of Transportation (Non-Medical): Not on file  Physical Activity:   . Days of Exercise per Week: Not on file  . Minutes of Exercise per Session: Not on file  Stress:   . Feeling of Stress : Not on file  Social Connections:   . Frequency of Communication with Friends and Family: Not on file  . Frequency of Social Gatherings with Friends and Family: Not on file  . Attends Religious Services: Not on file  . Active Member of Clubs or Organizations: Not on file  . Attends Archivist Meetings: Not on file  . Marital Status: Not on file     Family History: The patient's family history includes Alcoholism in her father; Alzheimer's disease in her mother; Breast cancer in her cousin and maternal aunt; Cirrhosis in her father; Colon cancer in her maternal uncle; Diabetes in her brother; Diabetes (age of onset: 24) in her mother; Glaucoma in her mother; Heart disease in her maternal aunt, maternal grandmother, and mother; Hypertension in her brother, father, and mother; Other in her paternal grandmother.  ROS:   Please see the history of present illness.    Fatigue, decreased energy, lower back discomfort with sciatica.  Celebrex as needed for control of pain.  All other systems reviewed and are negative.  EKGs/Labs/Other Studies  Reviewed:    The following studies were reviewed today: No new cardiac data  EKG:  EKG normal sinus rhythm, first-degree AV block with PR interval 254 ms.  This is new compared to 2019.  Recent Labs: No results found for requested labs within last 8760 hours.  Recent Lipid Panel    Component Value Date/Time   CHOL 221 (H) 12/19/2017 0000   TRIG 65 12/19/2017 0000   HDL 122 12/19/2017 0000  CHOLHDL 1.8 12/19/2017 0000   LDLCALC 86 12/19/2017 0000    Physical Exam:    VS:  BP 128/76   Fisher 61   Ht 5\' 5"  (1.651 m)   Wt 178 lb 12.8 oz (81.1 kg)   LMP 11/04/1990   SpO2 99%   BMI 29.75 kg/m     Wt Readings from Last 3 Encounters:  03/09/19 178 lb 12.8 oz (81.1 kg)  08/14/18 165 lb (74.8 kg)  08/04/18 173 lb 8 oz (78.7 kg)     GEN: Fit appearing. No acute distress HEENT: Normal NECK: No JVD. LYMPHATICS: No lymphadenopathy CARDIAC:  RRR without murmur, gallop, or edema. VASCULAR:  Normal Pulses. No bruits. RESPIRATORY:  Clear to auscultation without rales, wheezing or rhonchi  ABDOMEN: Soft, non-tender, non-distended, No pulsatile mass, MUSCULOSKELETAL: No deformity  SKIN: Warm and dry NEUROLOGIC:  Alert and oriented x 3 PSYCHIATRIC:  Normal affect   ASSESSMENT:    1. Angina pectoris ([place])   2. First degree AV block   3. Hyperlipidemia LDL goal <70   4. History of lymphoma   5. Educated about COVID-19 virus infection   6. Fatigue, unspecified type    PLAN:    In order of problems listed above:  1. Despite discontinuation of diltiazem, no angina has occurred. 2. First-degree AV block is new compared to 2019 EKG.  She is on no offending medications that can prolong PR interval.  Diltiazem was discontinued by Dr. Brigitte Fisher.  I agree with the decision. 3. LDL target should be less than 70.  Agree with continuing Pravachol at current dose. 4. Not discussed 5. 3W's discussed and encouraged to avoid COVID-19 infection. 6. Fatigue could be related to sleep deprivation.   If fatigue becomes exertional I will begin to have concerns about the possibility of exercise-induced worsening of AV block preventing increase in heart rate.  Continue without antianginal therapy other than sublingual nitroglycerin which seems to work.   Medication Adjustments/Labs and Tests Ordered: Current medicines are reviewed at length with the patient today.  Concerns regarding medicines are outlined above.  Orders Placed This Encounter  Procedures  . EKG 12-Lead   No orders of the defined types were placed in this encounter.   Patient Instructions  Medication Instructions:  Your physician recommends that you continue on your current medications as directed. Please refer to the Current Medication list given to you today.  *If you need a refill on your cardiac medications before your next appointment, please call your pharmacy*  Lab Work: None If you have labs (blood work) drawn today and your tests are completely normal, you will receive your results only by: Marland Kitchen MyChart Message (if you have MyChart) OR . A paper copy in the mail If you have any lab test that is abnormal or we need to change your treatment, we will call you to review the results.  Testing/Procedures: None  Follow-Up: At [facility], you and your health needs are our priority.  As part of our continuing mission to provide you with exceptional heart care, we have created designated Provider Care Teams.  These Care Teams include your primary Cardiologist (physician) and Advanced Practice Providers (APPs -  Physician Assistants and Nurse Practitioners) who all work together to provide you with the care you need, when you need it.  Your next appointment:   12 month(s)  The format for your next appointment:   In Person  Provider:   You may see Dr. Daneen Schick  or one  of the following Advanced Practice Providers on your designated Care Team:    Truitt Merle, NP  Cecilie Kicks, NP  Kathyrn Drown,  NP   Other Instructions      Signed, Sinclair Grooms, MD  03/09/2019 5:27 PM    [facility]

## 2019-03-09 ENCOUNTER — Encounter: Payer: Self-pay | Admitting: Interventional Cardiology

## 2019-03-09 ENCOUNTER — Other Ambulatory Visit: Payer: Self-pay

## 2019-03-09 ENCOUNTER — Ambulatory Visit: Payer: Medicare PPO | Admitting: Interventional Cardiology

## 2019-03-09 VITALS — BP 128/76 | HR 61 | Ht 65.0 in | Wt 178.8 lb

## 2019-03-09 DIAGNOSIS — Z8579 Personal history of other malignant neoplasms of lymphoid, hematopoietic and related tissues: Secondary | ICD-10-CM | POA: Diagnosis not present

## 2019-03-09 DIAGNOSIS — I440 Atrioventricular block, first degree: Secondary | ICD-10-CM | POA: Diagnosis not present

## 2019-03-09 DIAGNOSIS — I209 Angina pectoris, unspecified: Secondary | ICD-10-CM

## 2019-03-09 DIAGNOSIS — R5383 Other fatigue: Secondary | ICD-10-CM

## 2019-03-09 DIAGNOSIS — E785 Hyperlipidemia, unspecified: Secondary | ICD-10-CM

## 2019-03-09 DIAGNOSIS — Z7189 Other specified counseling: Secondary | ICD-10-CM

## 2019-03-09 NOTE — Patient Instructions (Signed)
Medication Instructions:  Your physician recommends that you continue on your current medications as directed. Please refer to the Current Medication list given to you today.  *If you need a refill on your cardiac medications before your next appointment, please call your pharmacy*  Lab Work: None If you have labs (blood work) drawn today and your tests are completely normal, you will receive your results only by: . MyChart Message (if you have MyChart) OR . A paper copy in the mail If you have any lab test that is abnormal or we need to change your treatment, we will call you to review the results.  Testing/Procedures: None  Follow-Up: At [facility], you and your health needs are our priority.  As part of our continuing mission to provide you with exceptional heart care, we have created designated Provider Care Teams.  These Care Teams include your primary Cardiologist (physician) and Advanced Practice Providers (APPs -  Physician Assistants and Nurse Practitioners) who all work together to provide you with the care you need, when you need it.  Your next appointment:   12 month(s)  The format for your next appointment:   In Person  Provider:   You may see Dr. Henry Smith or one of the following Advanced Practice Providers on your designated Care Team:    Lori Gerhardt, NP  Laura Ingold, NP  Jill McDaniel, NP   Other Instructions   

## 2019-03-16 ENCOUNTER — Telehealth (INDEPENDENT_AMBULATORY_CARE_PROVIDER_SITE_OTHER): Payer: Medicare PPO | Admitting: Obstetrics & Gynecology

## 2019-03-16 ENCOUNTER — Other Ambulatory Visit: Payer: Self-pay

## 2019-03-16 DIAGNOSIS — R5383 Other fatigue: Secondary | ICD-10-CM

## 2019-03-16 DIAGNOSIS — N951 Menopausal and female climacteric states: Secondary | ICD-10-CM | POA: Diagnosis not present

## 2019-03-16 NOTE — Progress Notes (Signed)
Virtual Visit via Video Note  I connected with Deborah Fisher on 03/16/19 at  4:30 PM EST by a video enabled telemedicine application and verified that I am speaking with the correct person using two identifiers.  Location: Patient: home Provider: office   I discussed the limitations of evaluation and management by telemedicine and the availability of in person appointments. The patient expressed understanding and agreed to proceed.  We had issues with her internet connection.  We could connect but there was a lot of issues with feedback on the phone.  We decided that we would be better with a phone conversation.    History of Present Illness: 49 G0 SAAF who wants to discuss fatigue symptoms that she is experiencing.  She stopped her HRT and transitioned to Citalopram and gabapentin.  She's experienced increased fatigue over the past six months.  She spoke with Dr. Brigitte Pulse and he switched her from citalopram to Prestiq.  She is still taking the Gabapentin 300mg  nightly and then 100 in the am.    She really would like to consider HRT but has been diagnosed with microvascular angina and clearly understands that HRT will increase her risk of MI and is it not appropriate at this point.  Evidence based information about SSRIs and hot flash improvement reviewed.  Venlafaxine does appear to have most improvement but she tried this several years ago when we first tried to address her symptoms.  She does not want to try this again at this time.     Observations/Objective: Menopausal hot flashes Fatigue  Assessment and Plan: She is going to consider staying at her current gabapentin  dosage to see if this continues to manage her hot flashes and see if this helps with her fatigue concerns.  She does know HRT is not an option  Follow Up Instructions: I discussed the assessment and treatment plan with the patient. The patient was provided an opportunity to ask questions and all were answered. The patient  agreed with the plan and demonstrated an understanding of the instructions.   The patient was advised to call back or seek an in-person evaluation if the symptoms worsen or if the condition fails to improve as anticipated.  I provided 20 minutes of non-face-to-face time during this encounter.   Megan Salon, MD

## 2019-03-18 DIAGNOSIS — G5603 Carpal tunnel syndrome, bilateral upper limbs: Secondary | ICD-10-CM | POA: Insufficient documentation

## 2019-03-22 ENCOUNTER — Encounter: Payer: Self-pay | Admitting: Obstetrics & Gynecology

## 2019-04-02 ENCOUNTER — Other Ambulatory Visit: Payer: Self-pay | Admitting: Obstetrics & Gynecology

## 2019-04-03 ENCOUNTER — Ambulatory Visit: Payer: Medicare PPO

## 2019-04-09 ENCOUNTER — Other Ambulatory Visit: Payer: Self-pay | Admitting: Interventional Cardiology

## 2019-04-11 ENCOUNTER — Ambulatory Visit: Payer: Medicare PPO

## 2019-04-23 ENCOUNTER — Ambulatory Visit: Payer: Medicare PPO | Admitting: Sports Medicine

## 2019-04-24 ENCOUNTER — Ambulatory Visit: Payer: Medicare PPO

## 2019-05-07 ENCOUNTER — Encounter: Payer: Self-pay | Admitting: Sports Medicine

## 2019-05-07 ENCOUNTER — Ambulatory Visit: Payer: Medicare PPO | Admitting: Sports Medicine

## 2019-05-07 ENCOUNTER — Other Ambulatory Visit: Payer: Self-pay

## 2019-05-07 VITALS — BP 126/70 | Ht 64.5 in | Wt 165.0 lb

## 2019-05-07 DIAGNOSIS — M48061 Spinal stenosis, lumbar region without neurogenic claudication: Secondary | ICD-10-CM | POA: Diagnosis not present

## 2019-05-07 NOTE — Progress Notes (Signed)
PCP: Marton Redwood, MD  Subjective:   HPI: Deborah Fisher is a 69 y.o. female here for evaluation of low back pain.  Deborah Fisher has had back pain since September.  The pain is intermittent but mostly occurs with activity.  Pain is located in her bilateral lower back.  She does have radiation of pain down her left leg as well into both buttocks.  Occasionally Deborah Fisher will get symptoms down her right leg as well.  Deborah Fisher is very active and enjoys hiking as well as playing pickle ball.  Deborah Fisher notes leaning forward tends to relieve the symptoms.  Extension aggravates her symptoms.  She denies any specific injury or trauma.  Deborah Fisher had x-rays of her back in the past which showed scoliosis as well as spinal stenosis.  In the past Deborah Fisher has had lumbar epidural injections for her symptoms which did provide her relief initially however they became less effective over time.   Review of Systems: See HPI above.  Past Medical History:  Diagnosis Date  . Bursitis    left hip   . Chronic constipation   . DI (detrusor instability)   . Diabetes ([place])    pre-diabetes, controlling with diet and exercise  . Femur fracture ([place]) 2011  . Fibroid   . Headache   . Menopausal symptoms   . Non-Hodgkin lymphoma ([place]) 1992   treated with CHOP  . Osteoporosis    h/o 3 stress fractures  . Postmenopausal HRT (hormone replacement therapy)   . Tennis elbow Right   . Trigger finger    bilateral     Current Outpatient Medications on File Prior to Visit  Medication Sig Dispense Refill  . aspirin 81 MG EC tablet TAKE 1 TABLET BY MOUTH EVERY DAY 30 tablet 1  . brimonidine (ALPHAGAN) 0.2 % ophthalmic solution 1 drop 2 (two) times daily.    . Calcium Carbonate-Vitamin D (CALCIUM + D PO) Take 1 tablet by mouth 2 (two) times daily.     . celecoxib (CELEBREX) 200 MG capsule Take 200 mg by mouth 2 (two) times daily.    . Cetirizine HCl (ZYRTEC ALLERGY PO) Take 1 tablet by mouth daily as needed.     . Cholecalciferol  (VITAMIN D PO) Take 1 tablet by mouth 2 (two) times daily.     . Cyanocobalamin (VITAMIN B 12 PO) Take 1 tablet by mouth daily.     Marland Kitchen desvenlafaxine (PRISTIQ) 50 MG 24 hr tablet     . diltiazem (CARDIZEM CD) 180 MG 24 hr capsule Take 1 capsule (180 mg total) by mouth daily. Please make yearly appt with Dr. Tamala Julian for January before anymore refills. 1st attempt 90 capsule 0  . gabapentin (NEURONTIN) 100 MG capsule TAKE 2 CAPSULES IN THE AM, 2 CAPSULES IN THE AFTERNOON, AND THEN THREE AT NIGHT. 630 capsule 1  . HYDROcodone-acetaminophen (NORCO) 5-325 MG tablet 1-2 tabs po q6 hours prn pain 10 tablet 0  . Multiple Vitamin (MULTIVITAMIN) tablet Take 1 tablet by mouth daily.    . nitroGLYCERIN (NITROSTAT) 0.4 MG SL tablet Place 1 tablet (0.4 mg total) under the tongue every 5 (five) minutes as needed for chest pain. 25 tablet 11  . pravastatin (PRAVACHOL) 20 MG tablet TAKE 1 TABLET EVERY EVENING 90 tablet 3  . rizatriptan (MAXALT-MLT) 10 MG disintegrating tablet Take 10 mg by mouth as needed.      No current facility-administered medications on file prior to visit.    Past Surgical History:  Procedure Laterality Date  .  Biopsy for Non Hodgkins Lymphoma  1992  . DILATION AND CURETTAGE OF UTERUS    . PELVIC LAPAROSCOPY    . TOE SURGERY    . TRIGGER FINGER RELEASE Left 08/04/2018   Procedure: RELEASE TRIGGER FINGER/A-1 PULLEY;  Surgeon: Leanora Cover, MD;  Location: [facility];  Service: Orthopedics;  Laterality: Left;    Allergies  Allergen Reactions  . Iodinated Diagnostic Agents Other (See Comments)     Warm feeling once    Social History   Socioeconomic History  . Marital status: Single    Spouse name: Not on file  . Number of children: 0  . Years of education: PhD  . 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 Smoking status: Never Smoker  . Smokeless tobacco: Never Used   Substance and Sexual Activity  . Alcohol use: Yes    Alcohol/week: 2.0 standard drinks    Types: 2 Glasses of wine per week    Comment: social  . Drug use: No  . Sexual activity: Not Currently    Partners: Male    Birth control/protection: Post-menopausal  Other Topics Concern  . Not on file  Social History Narrative  . Not on file   Social Determinants of Health   Financial Resource Strain:   . Difficulty of Paying Living Expenses: Not on file  Food Insecurity:   . Worried About Charity fundraiser in the Last Year: Not on file  . Ran Out of Food in the Last Year: Not on file  Transportation Needs:   . Lack of Transportation (Medical): Not on file  . Lack of Transportation (Non-Medical): Not on file  Physical Activity:   . Days of Exercise per Week: Not on file  . Minutes of Exercise per Session: Not on file  Stress:   . Feeling of Stress : Not on file  Social Connections:   . Frequency of Communication with Friends and Family: Not on file  . Frequency of Social Gatherings with Friends and Family: Not on file  . Attends Religious Services: Not on file  . Active Member of Clubs or Organizations: Not on file  . Attends Archivist Meetings: Not on file  . Marital Status: Not on file  Intimate Partner Violence:   . Fear of Current or Ex-Partner: Not on file  . Emotionally Abused: Not on file  . Physically Abused: Not on file  . Sexually Abused: Not on file    Family History  Problem Relation Age of Onset  . Diabetes Mother 57  . Hypertension Mother   . Alzheimer's disease Mother   . Glaucoma Mother   . Heart disease Mother   . Hypertension Father   . Alcoholism Father        deceased age 25  . Cirrhosis Father   . Diabetes Brother   . Hypertension Brother   . Colon cancer Maternal Uncle   . Heart disease Maternal Grandmother   . Breast cancer Cousin        Mat. 1st cousin-Age 60  . Breast cancer Maternal Aunt        Age 41's  . Heart disease  Maternal Aunt   . Other Paternal Grandmother        MI        Objective:  Physical Exam: Ht 5' 4.5" (1.638 m)   Wt 165 lb (  74.8 kg)   LMP 11/04/1990   BMI 27.88 kg/m  Gen: NAD, comfortable in exam room Lungs: Breathing comfortably on room air Lumbar Exam -Inspection: Slight elevation of the upper lumbar lower thoracic spine on the right-hand side in comparison to the left.  However the remainder of her back appear to be in relatively neutral alignment -Palpation: Tenderness palpation along both SI joints and the paraspinal muscles in the lumbar region -ROM: Minimally reduced range of motion in all planes -Strength: 5/5 hip flexion bilaterally, 5/5 knee extension bilaterally, 5/5 knee flexion bilaterally, 5/5 foot dorsiflexion bilaterally, 5/5 foot plantarflexion bilaterally -Reflexes: 2+ patellar and Achilles reflexes bilaterally    Assessment & Plan:  Deborah Fisher is a 69 y.o. female here for evaluation of low back pain  1.  Spinal stenosis of the lumbar spine -Deborah Fisher had previous imaging confirming presence of spinal stenosis.  Her history today is also consistent with spinal stenosis. -Deborah Fisher was given a series of exercises to help with her spinal stenosis.  These include crunches, knee to shoulder, knee to opposite shoulder and pelvic tilt exercises -Deborah Fisher is already on gabapentin for her hot flashes.  Deborah Fisher will continue her current dose in the morning and afternoon.  We will increase the dose gradually by 100 mg in the evening until she reaches 600 mg at night  Deborah Fisher will follow up in 4 to 6 weeks if her pain persists  I observed and examined the Deborah Fisher with the Dr Sheppard Coil and agree with assessment and plan.  Note reviewed and modified by me. Ila Mcgill, MD

## 2019-05-07 NOTE — Patient Instructions (Addendum)
The pain in your low back as well as the pain going down your legs is from your spinal stenosis.  This can pinched nerves in your lower back which causes the shooting pain down your legs. -Work on the exercises shown to you at today's visit.  These include the crunches, knee to shoulder, knee to opposite shoulder, and pelvic tilt exercises -We will increase her gabapentin dose as this will help with the nerve related pain.  Continue to take your regular morning and afternoon dose.  In the evening increase the dose to 400 mg for 1 week.  After that increase the dose to 500 mg for 1 week.  Finally increase the nighttime dose to 600 mg and continue the gabapentin at the 600 mg dose  We will see back in 4 to 6 weeks if your pain persists

## 2019-05-22 ENCOUNTER — Other Ambulatory Visit: Payer: Self-pay | Admitting: Obstetrics & Gynecology

## 2019-05-22 NOTE — Telephone Encounter (Signed)
Medication refill request: gabapentin 100mg  Video visit: 03-27-2019 Next AEX: 06-12-2019 Last MMG (if hormonal medication request): 07-21-2018 neg Refill authorized: please approve if appropriate

## 2019-06-11 ENCOUNTER — Other Ambulatory Visit: Payer: Self-pay

## 2019-06-11 ENCOUNTER — Ambulatory Visit: Payer: Medicare PPO | Admitting: Sports Medicine

## 2019-06-11 DIAGNOSIS — M48061 Spinal stenosis, lumbar region without neurogenic claudication: Secondary | ICD-10-CM | POA: Diagnosis not present

## 2019-06-11 NOTE — Progress Notes (Signed)
CC; Check of lumbar spinal stenosis  On last visit we increased the gabapentin (taking for hot flashes) to 200 in AM and 600 HS 95% of tingling into thing and down lateral leg has resolved Resumed hiking up to 9 miles with minimal back pain Pain in buttocks has resolved  Home exercises - she has been inconsistent but doing them a few times per week Feels better afterwards  ROS No weakness lower extremes No numbness Sciatica resolved  PE Pleasant F in NAD BP 124/66   Ht 5' 4.5" (1.638 m)   Wt 165 lb (74.8 kg)   LMP 11/04/1990   BMI 27.88 kg/m    Good flexion and extension of lumbar spine No pain on palpation SLR is neg bilat Good strength lower extremes Gait is normal

## 2019-06-11 NOTE — Patient Instructions (Signed)
Glad that your spinal stenosis is better  Keep up the higher dose of gabapentin 200 in the morning/ 600 mgm at night  4 key back exercises Knee to chest stretch the left knee to the Right shoulder and vice versa Pelvic tilt Add some easy abdominal crunches  Check with me in 3 or 4 months

## 2019-06-11 NOTE — Assessment & Plan Note (Signed)
Doing well Needs to remain on this dose 200/600 gabapentin Continue the HEP at least most days Reck 3 months

## 2019-06-12 ENCOUNTER — Other Ambulatory Visit: Payer: Self-pay

## 2019-06-12 ENCOUNTER — Encounter: Payer: Self-pay | Admitting: Obstetrics & Gynecology

## 2019-06-12 ENCOUNTER — Ambulatory Visit (INDEPENDENT_AMBULATORY_CARE_PROVIDER_SITE_OTHER): Payer: Medicare PPO | Admitting: Obstetrics & Gynecology

## 2019-06-12 VITALS — BP 108/68 | HR 70 | Temp 97.2°F | Resp 16 | Ht 65.75 in | Wt 174.0 lb

## 2019-06-12 DIAGNOSIS — Z01419 Encounter for gynecological examination (general) (routine) without abnormal findings: Secondary | ICD-10-CM

## 2019-06-12 MED ORDER — DESVENLAFAXINE SUCCINATE ER 50 MG PO TB24: 50.0000 mg | ORAL_TABLET | Freq: Every day | ORAL | 4 refills | Status: DC

## 2019-06-12 NOTE — Progress Notes (Signed)
69 y.o. G0P0 Single Black or [place] American female here for annual exam.  Doing well except for hot flashes.  Denies vaginal bleeding.       Pt is sure she did not have the Shingrix vaccination.  She just started this series on 06/05/2019.  EMR reviewed and this looks like and error.  [facility]  I am not removing this today.  She is aware this is possible.    Seeing Dr. Oneida Alar for spinal stenosis.  He increased the gabapentin.  Taking 600mg  and 200mg  at the morning.    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    Patient's last menstrual period was 11/04/1990.          Sexually active: No.  The current method of family planning is post menopausal status.    Exercising: Yes.    walk, hike, pickleball, bike Smoker:  no  Health Maintenance: Pap:  02-21-15 neg, 09-20-17 neg History of abnormal Pap:  no MMG:  07-21-2018 category c density birads 1:neg Colonoscopy:  02/22/10 f/u 10 yrs.  Pt is aware this is due later this year.   BMD:   2015, may have had another TDaP:  PCP Pneumonia vaccine(s):  pcp Shingrix:  Has just started this vaccination Hep C testing: pcp Screening Labs: Dr. Brigitte Pulse   reports that she has never smoked. She has never used smokeless tobacco. She reports current alcohol use of about 2.0 standard drinks of alcohol per week. She reports that she does not use drugs.  Past Medical History:  Diagnosis Date  . Bursitis    left hip   . Chronic constipation   . DI (detrusor instability)   . Diabetes ([place])    pre-diabetes, controlling with diet and exercise  . Femur fracture ([place]) 2011  . Fibroid   . Headache   . Menopausal symptoms   . Non-Hodgkin lymphoma ([place]) 1992   treated with CHOP  . Osteoporosis    h/o 3 stress fractures  . Postmenopausal HRT (hormone replacement therapy)   . Tennis elbow Right   . Trigger finger    bilateral     Past Surgical History:  Procedure Laterality Date  . Biopsy for Non Hodgkins  Lymphoma  1992  . DILATION AND CURETTAGE OF UTERUS    . PELVIC LAPAROSCOPY    . TOE SURGERY    . TRIGGER FINGER RELEASE Left 08/04/2018   Procedure: RELEASE TRIGGER FINGER/A-1 PULLEY;  Surgeon: Leanora Cover, MD;  Location: [place];  Service: Orthopedics;  Laterality: Left;    Current Outpatient Medications  Medication Sig Dispense Refill  . aspirin 81 MG EC tablet TAKE 1 TABLET BY MOUTH EVERY DAY 30 tablet 1  . brimonidine (ALPHAGAN) 0.2 % ophthalmic solution 1 drop 2 (two) times daily.    . Calcium Carbonate-Vitamin D (CALCIUM + D PO) Take 1 tablet by mouth 2 (two) times daily.     . celecoxib (CELEBREX) 200 MG capsule Take 200 mg by mouth 2 (two) times daily.    . Cetirizine HCl (ZYRTEC ALLERGY PO) Take 1 tablet by mouth daily as needed.     . Cholecalciferol (VITAMIN D PO) Take 1 tablet by mouth 2 (two) times daily.     . Cyanocobalamin (VITAMIN B 12 PO) Take 1 tablet by mouth daily.     Marland Kitchen desvenlafaxine (PRISTIQ) 50 MG 24 hr tablet     . diltiazem (CARDIZEM CD) 180 MG 24 hr  capsule Take 1 capsule (180 mg total) by mouth daily. Please make yearly appt with Dr. Tamala Julian for January before anymore refills. 1st attempt 90 capsule 0  . gabapentin (NEURONTIN) 100 MG capsule TAKE 2 CAPSULES IN THE AM, 2 CAPSULES IN THE AFTERNOON, AND THEN THREE AT NIGHT. 630 capsule 0  . HYDROcodone-acetaminophen (NORCO) 5-325 MG tablet 1-2 tabs po q6 hours prn pain 10 tablet 0  . Multiple Vitamin (MULTIVITAMIN) tablet Take 1 tablet by mouth daily.    . nitroGLYCERIN (NITROSTAT) 0.4 MG SL tablet Place 1 tablet (0.4 mg total) under the tongue every 5 (five) minutes as needed for chest pain. 25 tablet 11  . pravastatin (PRAVACHOL) 20 MG tablet TAKE 1 TABLET EVERY EVENING 90 tablet 3  . rizatriptan (MAXALT-MLT) 10 MG disintegrating tablet Take 10 mg by mouth as needed.      No current facility-administered medications for this visit.    Family History  Problem Relation Age of Onset  . Diabetes  Mother 15  . Hypertension Mother   . Alzheimer's disease Mother   . Glaucoma Mother   . Heart disease Mother   . Hypertension Father   . Alcoholism Father        deceased age 41  . Cirrhosis Father   . Diabetes Brother   . Hypertension Brother   . Colon cancer Maternal Uncle   . Heart disease Maternal Grandmother   . Breast cancer Cousin        Mat. 1st cousin-Age 6  . Breast cancer Maternal Aunt        Age 18's  . Heart disease Maternal Aunt   . Other Paternal Grandmother        MI    Review of Systems  Constitutional: Negative.   HENT: Negative.   Eyes: Negative.   Respiratory: Negative.   Cardiovascular: Negative.   Gastrointestinal: Negative.   Endocrine: Negative.   Genitourinary: Negative.   Musculoskeletal: Negative.   Skin: Negative.   Allergic/Immunologic: Negative.   Neurological: Negative.   Psychiatric/Behavioral: Negative.     Exam:   Vitals:   06/12/19 1527  BP: 108/68  Pulse: 70  Resp: 16  Temp: (!) 97.2 F (36.2 C)    General appearance: alert, cooperative and appears stated age Head: Normocephalic, without obvious abnormality, atraumatic Neck: no adenopathy, supple, symmetrical, trachea midline and thyroid normal to inspection and palpation Lungs: clear to auscultation bilaterally Breasts: normal appearance, no masses or tenderness Heart: regular rate and rhythm Abdomen: soft, non-tender; bowel sounds normal; no masses,  no organomegaly Extremities: extremities normal, atraumatic, no cyanosis or edema Skin: Skin color, texture, turgor normal. No rashes or lesions Lymph nodes: Cervical, supraclavicular, and axillary nodes normal. No abnormal inguinal nodes palpated Neurologic: Grossly normal   Pelvic: External genitalia:  no lesions              Urethra:  normal appearing urethra with no masses, tenderness or lesions              Bartholins and Skenes: normal                 Vagina: normal appearing vagina with normal color and  discharge, no lesions              Cervix: no lesions              Pap taken: No. Bimanual Exam:  Uterus:  normal size, contour, position, consistency, mobility, non-tender  Adnexa: normal adnexa and no mass, fullness, tenderness               Rectovaginal: Confirms               Anus:  normal sphincter tone, no lesions  Chaperone, Terence Lux, CMA, was present for exam.  A:  Well Woman with normal exam PMP, no HRT Hot flashes on Gabepentin Remote of hx of HSV H/o fibroid uterus withcalcified fibroids Osteopenia.  Followed by Dr. Brigitte Pulse  P:   Mammogram guidelines reviewed.  Doing yearly.   pap smear not indicated Will do BMD with Dr. Brigitte Pulse No RF for Valtrex needed RF for Prestiq 50mg  daily.  #90/4RF.   Colonoscopy up-to-date Blood work is done by Dr. Brigitte Pulse Will let me know when she needs gabapentin refill.  Also she will let me know about the dosage she needs as this is being adjusted with Dr. Oneida Alar. Return annually or prn

## 2019-06-24 ENCOUNTER — Telehealth: Payer: Self-pay | Admitting: *Deleted

## 2019-06-24 DIAGNOSIS — M65312 Trigger thumb, left thumb: Secondary | ICD-10-CM | POA: Diagnosis not present

## 2019-06-24 DIAGNOSIS — G5601 Carpal tunnel syndrome, right upper limb: Secondary | ICD-10-CM | POA: Diagnosis not present

## 2019-06-24 DIAGNOSIS — M65322 Trigger finger, left index finger: Secondary | ICD-10-CM | POA: Diagnosis not present

## 2019-06-24 NOTE — Telephone Encounter (Signed)
   [facility] Pre-operative Risk Assessment    Request for surgical clearance:  1. What type of surgery is being performed? RIGHT CARPAL TUNNEL RELEASE   2. When is this surgery scheduled? 08/24/19   3. What type of clearance is required (medical clearance vs. Pharmacy clearance to hold med vs. Both)? MEDICAL  4. Are there any medications that need to be held prior to surgery and how long? ASA    5. Practice name and name of physician performing surgery? [facility]; DR. Lennette Bihari KUZMA   6. What is your office phone number [phone]    7.   What is your office fax number [phone]  8.   Anesthesia type (None, local, MAC, general) ? CHOICE-PROBABLY IV REGIONAL    Julaine Hua 06/24/2019, 1:49 PM  _________________________________________________________________   (provider comments below)

## 2019-06-25 ENCOUNTER — Other Ambulatory Visit: Payer: Self-pay | Admitting: Orthopedic Surgery

## 2019-06-28 NOTE — Telephone Encounter (Signed)
Okay to hold aspirin for the time fram requested to perform surgery.

## 2019-07-01 ENCOUNTER — Other Ambulatory Visit: Payer: Self-pay | Admitting: Internal Medicine

## 2019-07-01 DIAGNOSIS — Z1231 Encounter for screening mammogram for malignant neoplasm of breast: Secondary | ICD-10-CM

## 2019-07-20 ENCOUNTER — Other Ambulatory Visit: Payer: Self-pay

## 2019-07-20 ENCOUNTER — Ambulatory Visit
Admission: RE | Admit: 2019-07-20 | Discharge: 2019-07-20 | Disposition: A | Discharge status: Home / Self Care | Payer: Medicare PPO | Source: Ambulatory Visit | Attending: Internal Medicine | Admitting: Internal Medicine

## 2019-07-20 DIAGNOSIS — Z1231 Encounter for screening mammogram for malignant neoplasm of breast: Secondary | ICD-10-CM | POA: Diagnosis not present

## 2019-08-05 DIAGNOSIS — H401232 Low-tension glaucoma, bilateral, moderate stage: Secondary | ICD-10-CM | POA: Diagnosis not present

## 2019-08-05 DIAGNOSIS — H2513 Age-related nuclear cataract, bilateral: Secondary | ICD-10-CM | POA: Diagnosis not present

## 2019-08-05 DIAGNOSIS — G43809 Other migraine, not intractable, without status migrainosus: Secondary | ICD-10-CM | POA: Diagnosis not present

## 2019-08-11 ENCOUNTER — Telehealth: Payer: Self-pay

## 2019-08-11 MED ORDER — GABAPENTIN 100 MG PO CAPS: ORAL_CAPSULE | ORAL | 0 refills | Status: DC

## 2019-08-11 NOTE — Telephone Encounter (Signed)
-----   Message from Deborah Fisher sent at 08/10/2019  2:12 PM EDT ----- Regarding: refill request Pt is asking for a refill on gabapentin.

## 2019-08-11 NOTE — Telephone Encounter (Signed)
Refill sent to pharmacy.   

## 2019-08-17 ENCOUNTER — Telehealth: Payer: Self-pay | Admitting: Interventional Cardiology

## 2019-08-17 ENCOUNTER — Other Ambulatory Visit: Payer: Self-pay | Admitting: *Deleted

## 2019-08-17 MED ORDER — NITROGLYCERIN 0.4 MG SL SUBL: 0.4000 mg | SUBLINGUAL_TABLET | SUBLINGUAL | 5 refills | Status: DC | PRN

## 2019-08-17 NOTE — Telephone Encounter (Signed)
New Message    *STAT* If patient is at the pharmacy, call can be transferred to refill team.   1. Which medications need to be refilled? (please list name of each medication and dose if known) nitroGLYCERIN (NITROSTAT) 0.4 MG SL tablet  2. Which pharmacy/location (including street and city if local pharmacy) is medication to be sent to? [id number] [place] [place] - [address] AT [address]  3. Do they need a 30 day or 90 day supply? [place]

## 2019-08-17 NOTE — Telephone Encounter (Signed)
New Message   Patient states that she experienced jaw pain on exertion on Friday and Saturday evening. She states that it occurred during her evening walk, but it went away when they went downward. But as a caution she did take a nitroglycerin as a precaution. Patient did not experience any type of chest pain.  She wants to know is the jaw pain something she should be concerned about.

## 2019-08-17 NOTE — Telephone Encounter (Signed)
Rx sent in as requested. 

## 2019-08-17 NOTE — Telephone Encounter (Signed)
See previous phone encounter already opened up in regards to this pt calling the office.  Triage attempted to call her back and she did not answer.  Will close this encounter.

## 2019-08-17 NOTE — Telephone Encounter (Signed)
Left message for patient to call back  

## 2019-08-17 NOTE — Telephone Encounter (Signed)
Attempted to call the pt back and phone kept ringing, then hung up.  Attempted x 2.

## 2019-08-17 NOTE — Telephone Encounter (Signed)
Patient returning Michelle's call  °

## 2019-08-18 NOTE — Telephone Encounter (Signed)
Deborah Fisher is calling back requesting to speak with a nurse in regards to her being called yesterday due to never speaking with anyone in regards to it. Please advise.

## 2019-08-18 NOTE — Telephone Encounter (Signed)
See previous note

## 2019-08-18 NOTE — Telephone Encounter (Signed)
Patient complaining of jaw pain on exertion going up hill only. Patient stated she feels fine walking flat surfaces and had no other symptoms when having jaw pain. Will send message to Dr. Tamala Julian for advisement. Patient is not having any symptoms right now and denies any SOB or chest pain.

## 2019-08-19 NOTE — Telephone Encounter (Signed)
Jaw pain can be an anginal equivalent. Recommend watchful waiting. Continue to monitor. Has she had jaw pain with chest pain in past?

## 2019-08-19 NOTE — Telephone Encounter (Signed)
Called patient back. Patient stated she has never had jaw pain with chest pain. Informed her that Dr. Tamala Julian recommends watchful waiting. Patient verbalized understanding.

## 2019-09-01 DIAGNOSIS — R7301 Impaired fasting glucose: Secondary | ICD-10-CM | POA: Diagnosis not present

## 2019-09-01 DIAGNOSIS — M810 Age-related osteoporosis without current pathological fracture: Secondary | ICD-10-CM | POA: Diagnosis not present

## 2019-09-01 DIAGNOSIS — E7849 Other hyperlipidemia: Secondary | ICD-10-CM | POA: Diagnosis not present

## 2019-09-01 DIAGNOSIS — Z0000 Encounter for general adult medical examination without abnormal findings: Secondary | ICD-10-CM | POA: Diagnosis not present

## 2019-09-02 ENCOUNTER — Encounter (HOSPITAL_BASED_OUTPATIENT_CLINIC_OR_DEPARTMENT_OTHER): Payer: Self-pay | Admitting: Orthopedic Surgery

## 2019-09-02 ENCOUNTER — Other Ambulatory Visit: Payer: Self-pay

## 2019-09-08 ENCOUNTER — Other Ambulatory Visit (HOSPITAL_COMMUNITY)
Admission: RE | Admit: 2019-09-08 | Discharge: 2019-09-08 | Disposition: A | Discharge status: Home / Self Care | Payer: Medicare PPO | Source: Ambulatory Visit | Attending: Orthopedic Surgery | Admitting: Orthopedic Surgery

## 2019-09-08 DIAGNOSIS — Z0000 Encounter for general adult medical examination without abnormal findings: Secondary | ICD-10-CM | POA: Diagnosis not present

## 2019-09-08 DIAGNOSIS — R7301 Impaired fasting glucose: Secondary | ICD-10-CM | POA: Diagnosis not present

## 2019-09-08 DIAGNOSIS — Z01812 Encounter for preprocedural laboratory examination: Secondary | ICD-10-CM | POA: Diagnosis not present

## 2019-09-08 DIAGNOSIS — M48061 Spinal stenosis, lumbar region without neurogenic claudication: Secondary | ICD-10-CM | POA: Diagnosis not present

## 2019-09-08 DIAGNOSIS — G5601 Carpal tunnel syndrome, right upper limb: Secondary | ICD-10-CM | POA: Diagnosis not present

## 2019-09-08 DIAGNOSIS — E7849 Other hyperlipidemia: Secondary | ICD-10-CM | POA: Diagnosis not present

## 2019-09-08 DIAGNOSIS — R82998 Other abnormal findings in urine: Secondary | ICD-10-CM | POA: Diagnosis not present

## 2019-09-08 DIAGNOSIS — Z8579 Personal history of other malignant neoplasms of lymphoid, hematopoietic and related tissues: Secondary | ICD-10-CM | POA: Diagnosis not present

## 2019-09-08 DIAGNOSIS — Z20822 Contact with and (suspected) exposure to covid-19: Secondary | ICD-10-CM | POA: Insufficient documentation

## 2019-09-08 DIAGNOSIS — Z1331 Encounter for screening for depression: Secondary | ICD-10-CM | POA: Diagnosis not present

## 2019-09-08 DIAGNOSIS — I201 Angina pectoris with documented spasm: Secondary | ICD-10-CM | POA: Diagnosis not present

## 2019-09-08 DIAGNOSIS — M810 Age-related osteoporosis without current pathological fracture: Secondary | ICD-10-CM | POA: Diagnosis not present

## 2019-09-08 DIAGNOSIS — Z1212 Encounter for screening for malignant neoplasm of rectum: Secondary | ICD-10-CM | POA: Diagnosis not present

## 2019-09-08 LAB — SARS CORONAVIRUS 2 (TAT 6-24 HRS): SARS Coronavirus 2: NEGATIVE

## 2019-09-08 NOTE — Progress Notes (Signed)

## 2019-09-11 ENCOUNTER — Encounter (HOSPITAL_BASED_OUTPATIENT_CLINIC_OR_DEPARTMENT_OTHER)
Admission: RE | Disposition: A | Discharge status: Home / Self Care | Payer: Self-pay | Source: Ambulatory Visit | Attending: Orthopedic Surgery

## 2019-09-11 ENCOUNTER — Other Ambulatory Visit: Payer: Self-pay

## 2019-09-11 ENCOUNTER — Ambulatory Visit (HOSPITAL_BASED_OUTPATIENT_CLINIC_OR_DEPARTMENT_OTHER): Payer: Medicare PPO | Admitting: Certified Registered"

## 2019-09-11 ENCOUNTER — Encounter (HOSPITAL_BASED_OUTPATIENT_CLINIC_OR_DEPARTMENT_OTHER): Payer: Self-pay | Admitting: Orthopedic Surgery

## 2019-09-11 ENCOUNTER — Ambulatory Visit (HOSPITAL_BASED_OUTPATIENT_CLINIC_OR_DEPARTMENT_OTHER)
Admission: RE | Admit: 2019-09-11 | Discharge: 2019-09-11 | Disposition: A | Discharge status: Home / Self Care | Payer: Medicare PPO | Source: Ambulatory Visit | Attending: Orthopedic Surgery | Admitting: Orthopedic Surgery

## 2019-09-11 DIAGNOSIS — M810 Age-related osteoporosis without current pathological fracture: Secondary | ICD-10-CM | POA: Insufficient documentation

## 2019-09-11 DIAGNOSIS — G5603 Carpal tunnel syndrome, bilateral upper limbs: Secondary | ICD-10-CM | POA: Diagnosis not present

## 2019-09-11 DIAGNOSIS — Z91041 Radiographic dye allergy status: Secondary | ICD-10-CM | POA: Insufficient documentation

## 2019-09-11 DIAGNOSIS — G5601 Carpal tunnel syndrome, right upper limb: Secondary | ICD-10-CM | POA: Insufficient documentation

## 2019-09-11 DIAGNOSIS — I209 Angina pectoris, unspecified: Secondary | ICD-10-CM | POA: Diagnosis not present

## 2019-09-11 DIAGNOSIS — Z8572 Personal history of non-Hodgkin lymphomas: Secondary | ICD-10-CM | POA: Insufficient documentation

## 2019-09-11 DIAGNOSIS — Z800 Family history of malignant neoplasm of digestive organs: Secondary | ICD-10-CM | POA: Insufficient documentation

## 2019-09-11 DIAGNOSIS — Z8262 Family history of osteoporosis: Secondary | ICD-10-CM | POA: Insufficient documentation

## 2019-09-11 DIAGNOSIS — D219 Benign neoplasm of connective and other soft tissue, unspecified: Secondary | ICD-10-CM | POA: Diagnosis not present

## 2019-09-11 DIAGNOSIS — Z803 Family history of malignant neoplasm of breast: Secondary | ICD-10-CM | POA: Insufficient documentation

## 2019-09-11 HISTORY — PX: CARPAL TUNNEL RELEASE: SHX101

## 2019-09-11 HISTORY — DX: Angina pectoris, unspecified: I20.9

## 2019-09-11 SURGERY — CARPAL TUNNEL RELEASE
Anesthesia: Monitor Anesthesia Care | Site: Hand | Laterality: Right

## 2019-09-11 MED ORDER — FENTANYL CITRATE (PF) 100 MCG/2ML IJ SOLN: 25.0000 ug | INTRAMUSCULAR | Status: DC | PRN

## 2019-09-11 MED ORDER — MIDAZOLAM HCL 2 MG/2ML IJ SOLN
INTRAMUSCULAR | Status: AC
  Filled 2019-09-11: qty 2

## 2019-09-11 MED ORDER — PROPOFOL 500 MG/50ML IV EMUL
INTRAVENOUS | Status: AC
  Filled 2019-09-11: qty 50

## 2019-09-11 MED ORDER — FENTANYL CITRATE (PF) 100 MCG/2ML IJ SOLN
INTRAMUSCULAR | Status: DC | PRN
  Administered 2019-09-11 (×2): 50 ug via INTRAVENOUS

## 2019-09-11 MED ORDER — CEFAZOLIN SODIUM-DEXTROSE 2-4 GM/100ML-% IV SOLN
2.0000 g | INTRAVENOUS | Status: AC
  Administered 2019-09-11: 2 g via INTRAVENOUS

## 2019-09-11 MED ORDER — BUPIVACAINE HCL (PF) 0.25 % IJ SOLN
INTRAMUSCULAR | Status: DC | PRN
  Administered 2019-09-11: 8 mL

## 2019-09-11 MED ORDER — MIDAZOLAM HCL 5 MG/5ML IJ SOLN
INTRAMUSCULAR | Status: DC | PRN
  Administered 2019-09-11: 2 mg via INTRAVENOUS

## 2019-09-11 MED ORDER — HYDROCODONE-ACETAMINOPHEN 5-325 MG PO TABS: ORAL_TABLET | ORAL | 0 refills | Status: DC

## 2019-09-11 MED ORDER — 0.9 % SODIUM CHLORIDE (POUR BTL) OPTIME
TOPICAL | Status: DC | PRN
  Administered 2019-09-11: 500 mL

## 2019-09-11 MED ORDER — LIDOCAINE HCL (PF) 0.5 % IJ SOLN
INTRAMUSCULAR | Status: DC | PRN
  Administered 2019-09-11: 40 mL via INTRAVENOUS

## 2019-09-11 MED ORDER — PROPOFOL 500 MG/50ML IV EMUL
INTRAVENOUS | Status: DC | PRN
  Administered 2019-09-11: 75 ug/kg/min via INTRAVENOUS

## 2019-09-11 MED ORDER — LIDOCAINE 2% (20 MG/ML) 5 ML SYRINGE
INTRAMUSCULAR | Status: AC
  Filled 2019-09-11: qty 5

## 2019-09-11 MED ORDER — LACTATED RINGERS IV SOLN: INTRAVENOUS | Status: DC

## 2019-09-11 MED ORDER — CEFAZOLIN SODIUM-DEXTROSE 2-4 GM/100ML-% IV SOLN
INTRAVENOUS | Status: AC
  Filled 2019-09-11: qty 100

## 2019-09-11 MED ORDER — BUPIVACAINE HCL (PF) 0.25 % IJ SOLN
INTRAMUSCULAR | Status: AC
  Filled 2019-09-11: qty 30

## 2019-09-11 MED ORDER — FENTANYL CITRATE (PF) 100 MCG/2ML IJ SOLN
INTRAMUSCULAR | Status: AC
  Filled 2019-09-11: qty 2

## 2019-09-11 SURGICAL SUPPLY — 34 items
BLADE SURG 15 STRL LF DISP TIS (BLADE) ×2 IMPLANT
BLADE SURG 15 STRL SS (BLADE) ×4
BNDG ELASTIC 3X5.8 VLCR STR LF (GAUZE/BANDAGES/DRESSINGS) ×3 IMPLANT
BNDG ELASTIC 4X5.8 VLCR STR LF (GAUZE/BANDAGES/DRESSINGS) ×3 IMPLANT
BNDG ESMARK 4X9 LF (GAUZE/BANDAGES/DRESSINGS) IMPLANT
BNDG GAUZE ELAST 4 BULKY (GAUZE/BANDAGES/DRESSINGS) ×3 IMPLANT
CHLORAPREP W/TINT 26 (MISCELLANEOUS) ×3 IMPLANT
CORD BIPOLAR FORCEPS 12FT (ELECTRODE) ×3 IMPLANT
COVER BACK TABLE 60X90IN (DRAPES) ×3 IMPLANT
COVER MAYO STAND STRL (DRAPES) ×3 IMPLANT
COVER WAND RF STERILE (DRAPES) IMPLANT
CUFF TOURN SGL QUICK 18X4 (TOURNIQUET CUFF) ×3 IMPLANT
DRAPE EXTREMITY T 121X128X90 (DISPOSABLE) ×3 IMPLANT
DRAPE SURG 17X23 STRL (DRAPES) ×3 IMPLANT
DRSG PAD ABDOMINAL 8X10 ST (GAUZE/BANDAGES/DRESSINGS) ×3 IMPLANT
GAUZE SPONGE 4X4 12PLY STRL (GAUZE/BANDAGES/DRESSINGS) ×3 IMPLANT
GAUZE XEROFORM 1X8 LF (GAUZE/BANDAGES/DRESSINGS) ×3 IMPLANT
GLOVE BIO SURGEON STRL SZ7.5 (GLOVE) ×3 IMPLANT
GLOVE BIOGEL PI IND STRL 8 (GLOVE) ×1 IMPLANT
GLOVE BIOGEL PI INDICATOR 8 (GLOVE) ×2
GOWN STRL REUS W/ TWL LRG LVL3 (GOWN DISPOSABLE) ×1 IMPLANT
GOWN STRL REUS W/TWL LRG LVL3 (GOWN DISPOSABLE) ×2
GOWN STRL REUS W/TWL XL LVL3 (GOWN DISPOSABLE) ×3 IMPLANT
NEEDLE HYPO 25X1 1.5 SAFETY (NEEDLE) ×3 IMPLANT
NS IRRIG 1000ML POUR BTL (IV SOLUTION) ×3 IMPLANT
PACK BASIN DAY SURGERY FS (CUSTOM PROCEDURE TRAY) ×3 IMPLANT
PADDING CAST ABS 4INX4YD NS (CAST SUPPLIES) ×2
PADDING CAST ABS COTTON 4X4 ST (CAST SUPPLIES) ×1 IMPLANT
STOCKINETTE 4X48 STRL (DRAPES) ×3 IMPLANT
SUT ETHILON 4 0 PS 2 18 (SUTURE) ×3 IMPLANT
SYR BULB EAR ULCER 3OZ GRN STR (SYRINGE) ×3 IMPLANT
SYR CONTROL 10ML LL (SYRINGE) ×3 IMPLANT
TOWEL GREEN STERILE FF (TOWEL DISPOSABLE) ×6 IMPLANT
UNDERPAD 30X36 HEAVY ABSORB (UNDERPADS AND DIAPERS) ×3 IMPLANT

## 2019-09-11 NOTE — Anesthesia Procedure Notes (Signed)
Anesthesia Regional Block: Bier block (IV Regional)   Pre-Anesthetic Checklist: ,, timeout performed, Correct Patient, Correct Site, Correct Laterality, Correct Procedure, Correct Position, site marked, Risks and benefits discussed,  Surgical consent,  Pre-op evaluation,  At surgeon's request and post-op pain management  Laterality: Right  Prep: alcohol swabs       Needles:       Needle Gauge: 22     Additional Needles:   Procedures:,,,,,,,, #20gu IV placed  Narrative:  CRNA: Verita Lamb, CRNA

## 2019-09-11 NOTE — Discharge Instructions (Addendum)
[facility] Instructions Hand Surgery  Wound Care: Keep your hand elevated above the level of your heart.  Do not allow it to dangle by your side.  Keep the dressing dry and do not remove it unless your doctor advises you to do so.  He will usually change it at the time of your post-op visit.  Moving your fingers is advised to stimulate circulation but will depend on the site of your surgery.  If you have a splint applied, your doctor will advise you regarding movement.  Activity: Do not drive or operate machinery today.  Rest today and then you may return to your normal activity and work as indicated by your physician.  Diet:      Post Anesthesia Home Care Instructions  Activity: Get plenty of rest for the remainder of the day. A responsible individual must stay with you for 24 hours following the procedure.  For the next 24 hours, DO NOT: -Drive a car -Paediatric nurse -Drink alcoholic beverages -Take any medication unless instructed by your physician -Make any legal decisions or sign important papers.  Meals: Start with liquid foods such as gelatin or soup. Progress to regular foods as tolerated. Avoid greasy, spicy, heavy foods. If nausea and/or vomiting occur, drink only clear liquids until the nausea and/or vomiting subsides. Call your physician if vomiting continues.  Special Instructions/Symptoms: Your throat may feel dry or sore from the anesthesia or the breathing tube placed in your throat during surgery. If this causes discomfort, gargle with warm salt water. The discomfort should disappear within 24 hours.  If you had a scopolamine patch placed behind your ear for the management of post- operative nausea and/or vomiting:  1. The medication in the patch is effective for 72 hours, after which it should be removed.  Wrap patch in a tissue and discard in the trash. Wash hands thoroughly with soap and water. 2. You may remove the patch earlier than 72 hours if you  experience unpleasant side effects which may include dry mouth, dizziness or visual disturbances. 3. Avoid touching the patch. Wash your hands with soap and water after contact with the patch.    Drink liquids today or eat a light diet.  You may resume a regular diet tomorrow.    General expectations: Pain for two to three days. Fingers may become slightly swollen.  Call your doctor if any of the following occur: Severe pain not relieved by pain medication. Elevated temperature. Dressing soaked with blood. Inability to move fingers. White or bluish color to fingers.

## 2019-09-11 NOTE — Anesthesia Postprocedure Evaluation (Signed)
Anesthesia Post Note  Patient: Deborah Fisher  Procedure(s) Performed: CARPAL TUNNEL RELEASE (Right Hand)     Patient location during evaluation: PACU Anesthesia Type: MAC and Bier Block Level of consciousness: awake and alert Pain management: pain level controlled Vital Signs Assessment: post-procedure vital signs reviewed and stable Respiratory status: spontaneous breathing, nonlabored ventilation, respiratory function stable and patient connected to nasal cannula oxygen Cardiovascular status: stable and blood pressure returned to baseline Postop Assessment: no apparent nausea or vomiting Anesthetic complications: no   No complications documented.  Last Vitals:  Vitals:   09/11/19 1400 09/11/19 1415  BP: 139/74 (!) 145/79  Pulse: (!) 55 (!) 53  Resp: 20 16  Temp:  36.6 C  SpO2: 100% 97%    Last Pain:  Vitals:   09/11/19 1415  TempSrc:   PainSc: 0-No pain                 Dondi Burandt L Gretel Cantu

## 2019-09-11 NOTE — H&P (Signed)
Deborah Fisher is an 69 y.o. female.   Chief Complaint: carpal tunnel syndrome HPI: 69 yo female with numbness and tingling right hand.  Positive nerve conduction studies.  She wishes to have a carpal tunnel release.  Allergies:  Allergies  Allergen Reactions  . Iodinated Diagnostic Agents Other (See Comments)     Warm feeling once    Past Medical History:  Diagnosis Date  . Anginal pain ([place])   . Bursitis    left hip   . Chronic constipation   . DI (detrusor instability)   . Femur fracture ([place]) 2011  . Fibroid   . Headache   . Menopausal symptoms   . Non-Hodgkin lymphoma ([place]) 1992   treated with CHOP  . Osteoporosis    h/o 3 stress fractures  . Postmenopausal HRT (hormone replacement therapy)   . Tennis elbow Right   . Trigger finger    bilateral     Past Surgical History:  Procedure Laterality Date  . Biopsy for Non Hodgkins Lymphoma  1992  . DILATION AND CURETTAGE OF UTERUS    . PELVIC LAPAROSCOPY    . TOE SURGERY    . TRIGGER FINGER RELEASE Left 08/04/2018   Procedure: RELEASE TRIGGER FINGER/A-1 PULLEY;  Surgeon: Leanora Cover, MD;  Location: [place];  Service: Orthopedics;  Laterality: Left;    Family History: Family History  Problem Relation Age of Onset  . Diabetes Mother 76  . Hypertension Mother   . Alzheimer's disease Mother   . Glaucoma Mother   . Heart disease Mother   . Hypertension Father   . Alcoholism Father        deceased age 60  . Cirrhosis Father   . Diabetes Brother   . Hypertension Brother   . Colon cancer Maternal Uncle   . Heart disease Maternal Grandmother   . Breast cancer Cousin        Mat. 1st cousin-Age 82  . Breast cancer Maternal Aunt        Age 29's  . Heart disease Maternal Aunt   . Other Paternal Grandmother        MI    Social History:   reports that she has never smoked. She has never used smokeless tobacco. She reports current alcohol use. She reports that she does not use  drugs.  Medications: No medications prior to admission.    No results found for this or any previous visit (from the past 48 hour(s)).  No results found.   A comprehensive review of systems was negative.  Height 5\' 5"  (1.651 m), weight 74.8 kg, last menstrual period 11/04/1990.  General appearance: alert, cooperative and appears stated age Head: Normocephalic, without obvious abnormality, atraumatic Neck: supple, symmetrical, trachea midline Cardio: regular rate and rhythm Resp: clear to auscultation bilaterally Extremities: Intact sensation and capillary refill all digits.  +epl/fpl/io.  No wounds.  Pulses: 2+ and symmetric Skin: Skin color, texture, turgor normal. No rashes or lesions Neurologic: Grossly normal Incision/Wound: none  Assessment/Plan Right carpal tunnel syndrome.  Non operative and operative treatment options have been discussed with the patient and patient wishes to proceed with operative treatment. Risks, benefits, and alternatives of surgery have been discussed and the patient agrees with the plan of care.   Leanora Cover 09/11/2019, 11:17 AM

## 2019-09-11 NOTE — Anesthesia Preprocedure Evaluation (Addendum)
Anesthesia Evaluation  Patient identified by MRN, date of birth, ID band Patient awake    Reviewed: Allergy & Precautions, NPO status , Patient's Chart, lab work & pertinent test results  Airway Mallampati: III  TM Distance: >3 FB Neck ROM: Full  Mouth opening: Limited Mouth Opening  Dental no notable dental hx. (+) Teeth Intact, Dental Advisory Given   Pulmonary neg pulmonary ROS,    Pulmonary exam normal breath sounds clear to auscultation       Cardiovascular + angina with exertion Normal cardiovascular exam Rhythm:Regular Rate:Normal     Neuro/Psych  Headaches, negative psych ROS   GI/Hepatic negative GI ROS, Neg liver ROS,   Endo/Other  negative endocrine ROS  Renal/GU negative Renal ROS  negative genitourinary   Musculoskeletal  (+) Arthritis ,   Abdominal   Peds  Hematology negative hematology ROS (+)   Anesthesia Other Findings   Reproductive/Obstetrics                            Anesthesia Physical Anesthesia Plan  ASA: II  Anesthesia Plan: MAC and Bier Block and Bier Block-LIDOCAINE ONLY   Post-op Pain Management:    Induction: Intravenous  PONV Risk Score and Plan: 2 and Ondansetron, Dexamethasone, Treatment may vary due to age or medical condition and Propofol infusion  Airway Management Planned: Natural Airway  Additional Equipment:   Intra-op Plan:   Post-operative Plan: Extubation in OR  Informed Consent: I have reviewed the patients History and Physical, chart, labs and discussed the procedure including the risks, benefits and alternatives for the proposed anesthesia with the patient or authorized representative who has indicated his/her understanding and acceptance.     Dental advisory given  Plan Discussed with: CRNA  Anesthesia Plan Comments:        Anesthesia Quick Evaluation

## 2019-09-11 NOTE — Transfer of Care (Signed)
Immediate Anesthesia Transfer of Care Note  Patient: SREEJA SPIES  Procedure(s) Performed: CARPAL TUNNEL RELEASE (Right Hand)  Patient Location: PACU  Anesthesia Type:MAC and Bier block  Level of Consciousness: awake, alert  and oriented  Airway & Oxygen Therapy: Patient Spontanous Breathing and Patient connected to face mask oxygen  Post-op Assessment: Report given to RN and Post -op Vital signs reviewed and stable  Post vital signs: Reviewed and stable  Last Vitals:  Vitals Value Taken Time  BP    Temp    Pulse 50 09/11/19 1338  Resp 15 09/11/19 1338  SpO2 100 % 09/11/19 1338  Vitals shown include unvalidated device data.  Last Pain:  Vitals:   09/11/19 1159  TempSrc: Oral  PainSc: 0-No pain         Complications: No complications documented.

## 2019-09-11 NOTE — Op Note (Signed)
09/11/2019 [facility]                              OPERATIVE REPORT   PREOPERATIVE DIAGNOSIS:  Right carpal tunnel syndrome.  POSTOPERATIVE DIAGNOSIS:  Right carpal tunnel syndrome.  PROCEDURE:  Right carpal tunnel release.  SURGEON:  Leanora Cover, MD  ASSISTANT:  none.  ANESTHESIA: Bier block with sedation  IV FLUIDS:  Per anesthesia flow sheet.  ESTIMATED BLOOD LOSS:  Minimal.  COMPLICATIONS:  None.  SPECIMENS:  None.  TOURNIQUET TIME:    Total Tourniquet Time Documented: Upper Arm (Right) - 22 minutes Total: Upper Arm (Right) - 22 minutes   DISPOSITION:  Stable to PACU.  LOCATION:  SURGERY CENTER  INDICATIONS:  69 yo female with numbness and tingling right hand.  Positive nerve conduction studies.   She wishes to have a carpal tunnel release for management of her symptoms.  Risks, benefits and alternatives of surgery were discussed including the risk of blood loss; infection; damage to nerves, vessels, tendons, ligaments, bone; failure of surgery; need for additional surgery; complications with wound healing; continued pain; recurrence of carpal tunnel syndrome; and damage to motor branch. She voiced understanding of these risks and elected to proceed.   OPERATIVE COURSE:  After being identified preoperatively by myself, the patient and I agreed upon the procedure and site of procedure.  The surgical site was marked.  The risks, benefits, and alternatives of the surgery were reviewed and she wished to proceed.  Surgical consent had been signed.  She was given IV Ancef as preoperative antibiotic prophylaxis.  She was transferred to the operating room and placed on the operating room table in supine position with the Right upper extremity on an armboard.  Bier block anesthesia was induced by the anesthesiologist.  Right upper extremity was prepped and draped in normal sterile orthopaedic fashion.  A surgical pause was performed between the surgeons,  anesthesia, and operating room staff, and all were in agreement as to the patient, procedure, and site of procedure.  Tourniquet at the proximal aspect of the arm had been inflated for the Bier block  Incision was made over the transverse carpal ligament and carried into the subcutaneous tissues by spreading technique.  Bipolar electrocautery was used to obtain hemostasis.  The palmar fascia was sharply incised.  The transverse carpal ligament was identified and sharply incised.  It was incised distally first.  The flexor tendons were identified.  The flexor tendon to the ring finger was identified and retracted radially.  The transverse carpal ligament was then incised proximally.  Scissors were used to split the distal aspect of the volar antebrachial fascia.  A finger was placed into the wound to ensure complete decompression, which was the case.  The nerve was examined.  It was flattened and hyperemic and was bifid.  The motor branch was identified and was intact.  The wound was copiously irrigated with sterile saline.  It was then closed with 4-0 nylon in a horizontal mattress fashion.  It was injected with 0.25% plain Marcaine to aid in postoperative analgesia.  It was dressed with sterile Xeroform, 4x4s, an ABD, and wrapped with Kerlix and an Ace bandage.  Tourniquet was deflated at 22 minutes.  Fingertips were pink with brisk capillary refill after deflation of the tourniquet.  Operative drapes were broken down.  The patient was awoken from anesthesia safely.  She was transferred back to stretcher  and taken to the PACU in stable condition.  I will see her back in the office in 1 week for postoperative followup.  I will give her a prescription for Norco 5/325 1-2 tabs PO q6 hours prn pain, dispense # 20.    Leanora Cover, MD Electronically signed, 09/11/19

## 2019-09-14 ENCOUNTER — Encounter (HOSPITAL_BASED_OUTPATIENT_CLINIC_OR_DEPARTMENT_OTHER): Payer: Self-pay | Admitting: Orthopedic Surgery

## 2019-10-30 DIAGNOSIS — G5603 Carpal tunnel syndrome, bilateral upper limbs: Secondary | ICD-10-CM | POA: Diagnosis not present

## 2019-11-03 ENCOUNTER — Other Ambulatory Visit: Payer: Self-pay

## 2019-11-03 ENCOUNTER — Other Ambulatory Visit: Payer: Self-pay | Admitting: Sports Medicine

## 2019-11-03 MED ORDER — GABAPENTIN 100 MG PO CAPS: ORAL_CAPSULE | ORAL | 1 refills | Status: DC

## 2019-11-03 NOTE — Progress Notes (Signed)
Pt is asking for a refill on gabapentin. Pharmacy is same

## 2019-12-07 DIAGNOSIS — H2513 Age-related nuclear cataract, bilateral: Secondary | ICD-10-CM | POA: Diagnosis not present

## 2019-12-07 DIAGNOSIS — H401232 Low-tension glaucoma, bilateral, moderate stage: Secondary | ICD-10-CM | POA: Diagnosis not present

## 2019-12-07 DIAGNOSIS — G43809 Other migraine, not intractable, without status migrainosus: Secondary | ICD-10-CM | POA: Diagnosis not present

## 2020-01-14 ENCOUNTER — Other Ambulatory Visit: Payer: Self-pay | Admitting: Interventional Cardiology

## 2020-03-16 DIAGNOSIS — G43809 Other migraine, not intractable, without status migrainosus: Secondary | ICD-10-CM | POA: Diagnosis not present

## 2020-03-16 DIAGNOSIS — H401232 Low-tension glaucoma, bilateral, moderate stage: Secondary | ICD-10-CM | POA: Diagnosis not present

## 2020-03-16 DIAGNOSIS — H2513 Age-related nuclear cataract, bilateral: Secondary | ICD-10-CM | POA: Diagnosis not present

## 2020-04-06 ENCOUNTER — Other Ambulatory Visit: Payer: Self-pay | Admitting: Interventional Cardiology

## 2020-04-25 DIAGNOSIS — H401232 Low-tension glaucoma, bilateral, moderate stage: Secondary | ICD-10-CM | POA: Diagnosis not present

## 2020-05-06 ENCOUNTER — Other Ambulatory Visit: Payer: Self-pay | Admitting: Sports Medicine

## 2020-05-10 ENCOUNTER — Other Ambulatory Visit: Payer: Self-pay

## 2020-05-18 DIAGNOSIS — K529 Noninfective gastroenteritis and colitis, unspecified: Secondary | ICD-10-CM | POA: Diagnosis not present

## 2020-05-18 DIAGNOSIS — K6289 Other specified diseases of anus and rectum: Secondary | ICD-10-CM | POA: Diagnosis not present

## 2020-05-18 DIAGNOSIS — Z1211 Encounter for screening for malignant neoplasm of colon: Secondary | ICD-10-CM | POA: Diagnosis not present

## 2020-05-20 DIAGNOSIS — K529 Noninfective gastroenteritis and colitis, unspecified: Secondary | ICD-10-CM | POA: Diagnosis not present

## 2020-06-02 ENCOUNTER — Other Ambulatory Visit: Payer: Self-pay

## 2020-06-02 ENCOUNTER — Ambulatory Visit: Payer: Medicare PPO | Admitting: Sports Medicine

## 2020-06-02 VITALS — BP 120/70 | Ht 65.0 in | Wt 170.0 lb

## 2020-06-02 DIAGNOSIS — C8595 Non-Hodgkin lymphoma, unspecified, lymph nodes of inguinal region and lower limb: Secondary | ICD-10-CM | POA: Diagnosis not present

## 2020-06-02 DIAGNOSIS — M48061 Spinal stenosis, lumbar region without neurogenic claudication: Secondary | ICD-10-CM

## 2020-06-02 DIAGNOSIS — M25552 Pain in left hip: Secondary | ICD-10-CM

## 2020-06-02 MED ORDER — GABAPENTIN 300 MG PO CAPS
ORAL_CAPSULE | ORAL | 2 refills | Status: DC
Start: 2020-06-02 — End: 2020-11-04

## 2020-06-02 NOTE — Assessment & Plan Note (Signed)
Good response to gabapentin and HEP We will continue this Cont her hiking  Checking XR left hip and pelvis since remote hx of lymphoma

## 2020-06-02 NOTE — Assessment & Plan Note (Signed)
Considering HX that this involved pelvis and left leg I wanted to check XR today

## 2020-06-02 NOTE — Progress Notes (Signed)
CC: Chronic left thigh and groin pain  Non Hodgkins lymphoma Affected pelvis and left leg 30 years ago CHOP Rx  Femoral stress fracture left leg  MRI at ortho showed spinal stenosis  With radiating pain into her thighs I started her on Gabapentin Takes 200 morning/ 600 qhs We felt leg pain radiating from her lumbar spinal stenosis and neural impingement This improved her sxs greatly She hikes about 5 days per week  ROS No sciatica No weakness in left leg  PE Pleasant B F in NAD BP 120/70   Ht 5\' 5"  (1.651 m)   Wt 170 lb (77.1 kg)   LMP 11/04/1990   BMI 28.29 kg/m  No flowsheet data found.  Hips; Left and RT show full ROM Good abduction strength Good flexion strength No pain with SLR  No palpable tenderness over Lumbar area Back extension limited Flexion good Rotation and lateral bending slightly tight but normal Heel/ toe walk normal

## 2020-06-06 ENCOUNTER — Ambulatory Visit: Payer: Medicare PPO | Admitting: Interventional Cardiology

## 2020-06-06 ENCOUNTER — Other Ambulatory Visit: Payer: Self-pay

## 2020-06-06 ENCOUNTER — Encounter: Payer: Self-pay | Admitting: Interventional Cardiology

## 2020-06-06 VITALS — BP 118/68 | HR 71 | Ht 65.0 in | Wt 173.0 lb

## 2020-06-06 DIAGNOSIS — Z8579 Personal history of other malignant neoplasms of lymphoid, hematopoietic and related tissues: Secondary | ICD-10-CM

## 2020-06-06 DIAGNOSIS — E785 Hyperlipidemia, unspecified: Secondary | ICD-10-CM | POA: Diagnosis not present

## 2020-06-06 DIAGNOSIS — I209 Angina pectoris, unspecified: Secondary | ICD-10-CM | POA: Diagnosis not present

## 2020-06-06 DIAGNOSIS — I440 Atrioventricular block, first degree: Secondary | ICD-10-CM

## 2020-06-06 NOTE — Progress Notes (Signed)
Cardiology Office Note:    Date:  06/06/2020   ID:  [name], DOB April 18, 1950, MRN [id number]  PCP:  Deborah Fisher., MD  Cardiologist:  Deborah Grooms, MD   Referring MD: Deborah Fisher., MD   Chief Complaint  Patient presents with  . Chest Pain    Angina pectoris.?  Vasospastic or microvascular disease    History of Present Illness:    Deborah Fisher is a 70 y.o. female with a hx of of non Hodgkin's lymphoma referred by Dr. Brigitte Fisher for evaluation of chest pain.Prior cardiac evaluation by Dr. Bettina Fisher suggest the possibility of syndrome X / microvascular angina.  Dr. Abby Fisher is fresh off a 3-hour walk [facility].  The walk started at 9 and was completed at around 2:00 this afternoon.  She had no difficulty.  She had 1 instance about 2 weeks ago when she had chest tightness that required a sublingual nitroglycerin.  There was prompt relief.  The chest discomfort can be accompanied by left jaw discomfort.  That particular episode was similar to prior episodes.  Instances of chest pain requiring nitroglycerin are quite rare.  The 1 mentioned is the first in over a year.  Past Medical History:  Diagnosis Date  . Anginal pain ([facility])   . Bursitis    left hip   . Chronic constipation   . DI (detrusor instability)   . Femur fracture ([place]) 2011  . Fibroid   . Headache   . Menopausal symptoms   . Non-Hodgkin lymphoma (Deborah [facility]) 1992   treated with CHOP  . Osteoporosis    h/o 3 stress fractures  . Postmenopausal HRT (hormone replacement therapy)   . Tennis elbow Right   . Trigger finger    bilateral     Past Surgical History:  Procedure Laterality Date  . Biopsy for Non Hodgkins Lymphoma  1992  . CARPAL TUNNEL RELEASE Right 09/11/2019   Procedure: CARPAL TUNNEL RELEASE;  Surgeon: Leanora Cover, MD;  Location: [place];  Service: Orthopedics;  Laterality: Right;  . DILATION AND CURETTAGE OF UTERUS    . PELVIC LAPAROSCOPY    . TOE SURGERY    . TRIGGER  FINGER RELEASE Left 08/04/2018   Procedure: RELEASE TRIGGER FINGER/A-1 PULLEY;  Surgeon: Leanora Cover, MD;  Location: [facility];  Service: Orthopedics;  Laterality: Left;    Current Medications: Current Meds  Medication Sig  . APPLE CIDER VINEGAR PO Take by mouth.  Deborah [facility] aspirin 81 MG EC tablet TAKE 1 TABLET BY MOUTH EVERY DAY  . brimonidine (ALPHAGAN) 0.2 % ophthalmic solution 1 drop 2 (two) times daily.  . Calcium Carbonate-Vitamin D (CALCIUM + D PO) Take 1 tablet by mouth 2 (two) times daily.   . Cetirizine HCl (ZYRTEC ALLERGY PO) Take 1 tablet by mouth daily as needed.   . Cholecalciferol (VITAMIN D PO) Take 1 tablet by mouth 2 (two) times daily.   Deborah [facility] CINNAMON PO Take by mouth.  . Cyanocobalamin (VITAMIN B 12 PO) Take 1 tablet by mouth daily.   Deborah [facility] desvenlafaxine (PRISTIQ) 50 MG 24 hr tablet Take 1 tablet (50 mg total) by mouth daily.  Deborah [facility] gabapentin (NEURONTIN) 300 MG capsule Take 1 pill in the morning (300 mg). Take 2 pills in the evening (600 mg).  Debor[facility] HYDROcodone-acetaminophen (NORCO) 5-325 MG tablet 1-2 tabs po q6 hours prn pain  . Multiple Vitamin (MULTIVITAMIN) tablet Take 1 tablet by mouth daily.  . nitroGLYCERIN (NITROSTAT)  0.4 MG SL tablet Place 1 tablet (0.4 mg total) under the tongue every 5 (five) minutes as needed for chest pain.  . pravastatin (PRAVACHOL) 20 MG tablet TAKE 1 TABLET EVERY EVENING. Please keep upcoming appt in April 2022 with Dr. Tamala Fisher before anymore refills. Thank you  . rizatriptan (MAXALT-MLT) 10 MG disintegrating tablet Take 10 mg by mouth as needed.   . Turmeric (QC TUMERIC COMPLEX PO) Take by mouth.     Allergies:   Iodinated diagnostic agents   Social History   Socioeconomic History  . Marital status: Single    Spouse name: Not on file  . Number of children: 0  . Years of education: PhD  . 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 Smoking status:  Never Smoker  . Smokeless tobacco: Never Used  Vaping Use  . Vaping Use: Never used  Substance and Sexual Activity  . Alcohol use: Yes    Comment: social  . Drug use: No  . Sexual activity: Not Currently    Partners: Male    Birth control/protection: Post-menopausal  Other Topics Concern  . Not on file  Social History Narrative  . Not on file   Social Determinants of Health   Financial Resource Strain: Not on file  Food Insecurity: Not on file  Transportation Needs: Not on file  Physical Activity: Not on file  Stress: Not on file  Social Connections: Not on file     Family History: The patient's family history includes Alcoholism in her father; Alzheimer's disease in her mother; Breast cancer in her cousin and maternal aunt; Cirrhosis in her father; Colon cancer in her maternal uncle; Diabetes in her brother; Diabetes (age of onset: 84) in her mother; Glaucoma in her mother; Heart disease in her maternal aunt, maternal grandmother, and mother; Hypertension in her brother, father, and mother; Other in her paternal grandmother.  ROS:   Please see the history of present illness.    No claudication.  Denies migraine headaches.  No particular trigger for episodes of chest pain although frequently there are stress sores.  All other systems reviewed and are negative.  EKGs/Labs/Other Studies Reviewed:    The following studies were reviewed today: Coronary CTA September 2019 FINDINGS: Non-cardiac: See separate report from [facility].  Pulmonary veins drain normally to the left atrium.  Calcium Score: 0 Agatston units.  Coronary Arteries: Right dominant with no anomalies  LM: No plaque or stenosis.  LAD system: No plaque or stenosis.  Circumflex system: No plaque or stenosis.  RCA system: No plaque or stenosis.  IMPRESSION: 1.  Coronary artery calcium score 0 Agatston units.  2.  No significant coronary disease noted.  Deborah Fisher   EKG:   EKG first-degree AV block with QRS duration 270 ms.  Biatrial abnormality.  When compared to the prior tracing from January 2021, left foot axis is new and PR interval is slightly longer.  Recent Labs: No results found for requested labs within last 8760 hours.  Recent Lipid Panel    Component Value Date/Time   CHOL 221 (H) 12/19/2017 0000   TRIG 65 12/19/2017 0000   HDL 122 12/19/2017 0000   CHOLHDL 1.8 12/19/2017 0000   LDLCALC 86 12/19/2017 0000    Physical Exam:    VS:  BP 118/68   Fisher 71   Ht 5\' 5"  (1.651 m)   Wt  173 lb (78.5 kg)   LMP 11/04/1990   SpO2 98%   BMI 28.79 kg/m     Wt Readings from Last 3 Encounters:  06/06/20 173 lb (78.5 kg)  06/02/20 170 lb (77.1 kg)  09/11/19 171 lb 1.2 oz (77.6 kg)     GEN: Mildly overweight.. No acute distress HEENT: Normal NECK: No JVD. LYMPHATICS: No lymphadenopathy CARDIAC: No murmur. RRR no gallop, or edema. VASCULAR:  Normal Pulses. No bruits. RESPIRATORY:  Clear to auscultation without rales, wheezing or rhonchi  ABDOMEN: Soft, non-tender, non-distended, No pulsatile mass, MUSCULOSKELETAL: No deformity  SKIN: Warm and dry NEUROLOGIC:  Alert and oriented x 3 PSYCHIATRIC:  Normal affect   ASSESSMENT:    1. Angina pectoris ([place])   2. First degree AV block   3. Hyperlipidemia LDL goal <70   4. History of lymphoma    PLAN:    In order of problems listed above:  1. 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  No evidence of epicardial disease based upon relatively recent coronary CTA.  Also of note his that the coronary calcium score was 0. 2. First-degree AV block is slightly longer than 1 year ago.  She is not on medications that prolong AV conduction 3. She is on low intensity statin therapy in the form of Pravachol 20 mg/day.  The most recent LDL was 110.  With a coronary calcium score 3 is a goal of 0, I do not believe further adjustment is needed.  Her HDL is incredibly elevated at 127.   Medication  Adjustments/Labs and Tests Ordered: Current medicines are reviewed at length with the patient today.  Concerns regarding medicines are outlined above.  Orders Placed This Encounter  Procedures  . EKG 12-Lead   No orders of the defined types were placed in this encounter.   Patient Instructions  Medication Instructions:  Your physician recommends that you continue on your current medications as directed. Please refer to the Current Medication list given to you today.  *If you need a refill on your cardiac medications before your next appointment, please call your pharmacy*   Lab Work: None If you have labs (blood work) drawn today and your tests are completely normal, you will receive your results only by: [facility] MyChart Message (if you have MyChart) OR . A paper copy in the mail If you have any lab test that is abnormal or we need to change your treatment, we will call you to review the results.   Testing/Procedures: None   Follow-Up: At [facility], you and your health needs are our priority.  As part of our continuing mission to provide you with exceptional heart care, we have created designated Provider Care Teams.  These Care Teams include your primary Cardiologist (physician) and Advanced Practice Providers (APPs -  Physician Assistants and Nurse Practitioners) who all work together to provide you with the care you need, when you need it.  We recommend signing up for the patient portal called "MyChart".  Sign up information is provided on this After Visit Summary.  MyChart is used to connect with patients for Virtual Visits (Telemedicine).  Patients are able to view lab/test results, encounter notes, upcoming appointments, etc.  Non-urgent messages can be sent to your provider as well.   To learn more about what you can do with MyChart, go to [email or web address].    Your next appointment:   1 year(s)  The format for your next appointment:   In Person  Provider:   You  may see  Deborah Grooms, MD or one of the following Advanced Practice Providers on your designated Care Team:    Kathyrn Drown, NP    Other Instructions      Signed, Deborah Grooms, MD  06/06/2020 5:08 PM    [place]

## 2020-06-06 NOTE — Patient Instructions (Signed)

## 2020-06-08 DIAGNOSIS — H401232 Low-tension glaucoma, bilateral, moderate stage: Secondary | ICD-10-CM | POA: Diagnosis not present

## 2020-06-17 ENCOUNTER — Ambulatory Visit
Admission: RE | Admit: 2020-06-17 | Discharge: 2020-06-17 | Disposition: A | Discharge status: Home / Self Care | Payer: Medicare PPO | Source: Ambulatory Visit | Attending: Sports Medicine | Admitting: Sports Medicine

## 2020-06-17 DIAGNOSIS — Z8572 Personal history of non-Hodgkin lymphomas: Secondary | ICD-10-CM | POA: Diagnosis not present

## 2020-06-17 DIAGNOSIS — M47816 Spondylosis without myelopathy or radiculopathy, lumbar region: Secondary | ICD-10-CM | POA: Diagnosis not present

## 2020-06-17 DIAGNOSIS — D259 Leiomyoma of uterus, unspecified: Secondary | ICD-10-CM | POA: Diagnosis not present

## 2020-06-17 DIAGNOSIS — M25552 Pain in left hip: Secondary | ICD-10-CM

## 2020-06-23 ENCOUNTER — Other Ambulatory Visit: Payer: Self-pay | Admitting: Internal Medicine

## 2020-06-23 DIAGNOSIS — Z1231 Encounter for screening mammogram for malignant neoplasm of breast: Secondary | ICD-10-CM

## 2020-06-30 ENCOUNTER — Other Ambulatory Visit: Payer: Self-pay | Admitting: Interventional Cardiology

## 2020-07-03 ENCOUNTER — Other Ambulatory Visit: Payer: Self-pay | Admitting: Obstetrics & Gynecology

## 2020-07-04 ENCOUNTER — Ambulatory Visit: Payer: Medicare PPO

## 2020-08-11 ENCOUNTER — Ambulatory Visit
Admission: RE | Admit: 2020-08-11 | Discharge: 2020-08-11 | Disposition: A | Discharge status: Home / Self Care | Payer: Medicare PPO | Source: Ambulatory Visit | Attending: Internal Medicine | Admitting: Internal Medicine

## 2020-08-11 ENCOUNTER — Other Ambulatory Visit: Payer: Self-pay

## 2020-08-11 DIAGNOSIS — Z1231 Encounter for screening mammogram for malignant neoplasm of breast: Secondary | ICD-10-CM | POA: Diagnosis not present

## 2020-08-15 ENCOUNTER — Other Ambulatory Visit: Payer: Self-pay | Admitting: Internal Medicine

## 2020-08-15 DIAGNOSIS — R928 Other abnormal and inconclusive findings on diagnostic imaging of breast: Secondary | ICD-10-CM

## 2020-08-22 ENCOUNTER — Encounter: Payer: Self-pay | Admitting: Sports Medicine

## 2020-08-22 ENCOUNTER — Ambulatory Visit: Payer: Medicare PPO | Admitting: Sports Medicine

## 2020-08-22 ENCOUNTER — Other Ambulatory Visit: Payer: Self-pay

## 2020-08-22 VITALS — BP 122/64 | Ht 64.5 in | Wt 170.0 lb

## 2020-08-22 DIAGNOSIS — R269 Unspecified abnormalities of gait and mobility: Secondary | ICD-10-CM

## 2020-08-22 NOTE — Progress Notes (Signed)
   Subjective:    Patient ID: Deborah Fisher, female    DOB: 01/24/1951, 70 y.o.   MRN: [id number]  HPI chief complaint: New custom orthotics  Patient is a very pleasant 70 year old female who comes in today for new custom orthotics.  Her last set of custom orthotics were made in June 2020.  She is an avid Museum/gallery curator.  She has a history of midfoot pain for which the orthotics have been helpful.  No recent injury.  Interim medical history reviewed Medications reviewed Allergies reviewed    Review of Systems As above    Objective:   Physical Exam  Well-developed, well-nourished.  No acute distress  Examination of both feet show a normal arch.  She does have some lateral column breakdown.  No tenderness to palpation along the metatarsal heads.  No swelling.  Good pulses.      Assessment & Plan:   Custom orthotics were created today as below.  Patient found them to be comfortable prior to leaving the office.  Continue with activity as tolerated and follow-up as needed.  Patient was fitted for a : standard, cushioned, semi-rigid orthotic. The orthotic was heated and afterward the patient stood on the orthotic blank positioned on the orthotic stand. The patient was positioned in subtalar neutral position and 10 degrees of ankle dorsiflexion in a weight bearing stance. After completion of molding, a stable base was applied to the orthotic blank. The blank was ground to a stable position for weight bearing. Size: 10 Base: Blue EVA Posting: none Additional orthotic padding: B/L MT pads

## 2020-08-23 ENCOUNTER — Other Ambulatory Visit: Payer: Self-pay | Admitting: Sports Medicine

## 2020-08-24 ENCOUNTER — Ambulatory Visit
Admission: RE | Admit: 2020-08-24 | Discharge: 2020-08-24 | Disposition: A | Discharge status: Home / Self Care | Payer: Medicare PPO | Source: Ambulatory Visit | Attending: Internal Medicine | Admitting: Internal Medicine

## 2020-08-24 ENCOUNTER — Other Ambulatory Visit: Payer: Self-pay

## 2020-08-24 ENCOUNTER — Other Ambulatory Visit: Payer: Self-pay | Admitting: Internal Medicine

## 2020-08-24 DIAGNOSIS — R922 Inconclusive mammogram: Secondary | ICD-10-CM | POA: Diagnosis not present

## 2020-08-24 DIAGNOSIS — R928 Other abnormal and inconclusive findings on diagnostic imaging of breast: Secondary | ICD-10-CM

## 2020-08-24 DIAGNOSIS — R921 Mammographic calcification found on diagnostic imaging of breast: Secondary | ICD-10-CM | POA: Diagnosis not present

## 2020-09-07 DIAGNOSIS — E785 Hyperlipidemia, unspecified: Secondary | ICD-10-CM | POA: Diagnosis not present

## 2020-09-07 DIAGNOSIS — M810 Age-related osteoporosis without current pathological fracture: Secondary | ICD-10-CM | POA: Diagnosis not present

## 2020-09-07 DIAGNOSIS — Z79899 Other long term (current) drug therapy: Secondary | ICD-10-CM | POA: Diagnosis not present

## 2020-09-07 DIAGNOSIS — R7301 Impaired fasting glucose: Secondary | ICD-10-CM | POA: Diagnosis not present

## 2020-09-08 ENCOUNTER — Other Ambulatory Visit: Payer: Self-pay

## 2020-09-08 ENCOUNTER — Other Ambulatory Visit: Payer: Self-pay | Admitting: Internal Medicine

## 2020-09-08 ENCOUNTER — Ambulatory Visit
Admission: RE | Admit: 2020-09-08 | Discharge: 2020-09-08 | Disposition: A | Discharge status: Home / Self Care | Payer: Medicare PPO | Source: Ambulatory Visit | Attending: Internal Medicine | Admitting: Internal Medicine

## 2020-09-08 DIAGNOSIS — R928 Other abnormal and inconclusive findings on diagnostic imaging of breast: Secondary | ICD-10-CM

## 2020-09-08 DIAGNOSIS — N6489 Other specified disorders of breast: Secondary | ICD-10-CM | POA: Diagnosis not present

## 2020-09-08 DIAGNOSIS — R921 Mammographic calcification found on diagnostic imaging of breast: Secondary | ICD-10-CM | POA: Diagnosis not present

## 2020-09-14 DIAGNOSIS — R7301 Impaired fasting glucose: Secondary | ICD-10-CM | POA: Diagnosis not present

## 2020-09-14 DIAGNOSIS — Z1331 Encounter for screening for depression: Secondary | ICD-10-CM | POA: Diagnosis not present

## 2020-09-14 DIAGNOSIS — Z1339 Encounter for screening examination for other mental health and behavioral disorders: Secondary | ICD-10-CM | POA: Diagnosis not present

## 2020-09-14 DIAGNOSIS — M810 Age-related osteoporosis without current pathological fracture: Secondary | ICD-10-CM | POA: Diagnosis not present

## 2020-09-14 DIAGNOSIS — M48061 Spinal stenosis, lumbar region without neurogenic claudication: Secondary | ICD-10-CM | POA: Diagnosis not present

## 2020-09-14 DIAGNOSIS — Z0000 Encounter for general adult medical examination without abnormal findings: Secondary | ICD-10-CM | POA: Diagnosis not present

## 2020-09-14 DIAGNOSIS — I201 Angina pectoris with documented spasm: Secondary | ICD-10-CM | POA: Diagnosis not present

## 2020-09-14 DIAGNOSIS — E785 Hyperlipidemia, unspecified: Secondary | ICD-10-CM | POA: Diagnosis not present

## 2020-09-15 DIAGNOSIS — Z1212 Encounter for screening for malignant neoplasm of rectum: Secondary | ICD-10-CM | POA: Diagnosis not present

## 2020-10-04 ENCOUNTER — Other Ambulatory Visit: Payer: Self-pay | Admitting: Sports Medicine

## 2020-10-06 DIAGNOSIS — M810 Age-related osteoporosis without current pathological fracture: Secondary | ICD-10-CM | POA: Diagnosis not present

## 2020-10-10 DIAGNOSIS — H2513 Age-related nuclear cataract, bilateral: Secondary | ICD-10-CM | POA: Diagnosis not present

## 2020-10-10 DIAGNOSIS — H401232 Low-tension glaucoma, bilateral, moderate stage: Secondary | ICD-10-CM | POA: Diagnosis not present

## 2020-10-10 DIAGNOSIS — G43809 Other migraine, not intractable, without status migrainosus: Secondary | ICD-10-CM | POA: Diagnosis not present

## 2020-10-27 ENCOUNTER — Encounter (HOSPITAL_BASED_OUTPATIENT_CLINIC_OR_DEPARTMENT_OTHER): Payer: Self-pay | Admitting: Obstetrics & Gynecology

## 2020-10-27 ENCOUNTER — Ambulatory Visit (INDEPENDENT_AMBULATORY_CARE_PROVIDER_SITE_OTHER): Payer: Medicare PPO | Admitting: Obstetrics & Gynecology

## 2020-10-27 ENCOUNTER — Other Ambulatory Visit: Payer: Self-pay

## 2020-10-27 ENCOUNTER — Other Ambulatory Visit (HOSPITAL_COMMUNITY)
Admission: RE | Admit: 2020-10-27 | Discharge: 2020-10-27 | Disposition: A | Discharge status: Home / Self Care | Payer: Medicare PPO | Source: Ambulatory Visit | Attending: Obstetrics & Gynecology | Admitting: Obstetrics & Gynecology

## 2020-10-27 VITALS — BP 136/63 | Ht 64.0 in | Wt 172.2 lb

## 2020-10-27 DIAGNOSIS — Z124 Encounter for screening for malignant neoplasm of cervix: Secondary | ICD-10-CM

## 2020-10-27 DIAGNOSIS — F419 Anxiety disorder, unspecified: Secondary | ICD-10-CM

## 2020-10-27 DIAGNOSIS — Z780 Asymptomatic menopausal state: Secondary | ICD-10-CM

## 2020-10-27 DIAGNOSIS — M8580 Other specified disorders of bone density and structure, unspecified site: Secondary | ICD-10-CM | POA: Diagnosis not present

## 2020-10-27 DIAGNOSIS — D219 Benign neoplasm of connective and other soft tissue, unspecified: Secondary | ICD-10-CM

## 2020-10-27 DIAGNOSIS — Z01419 Encounter for gynecological examination (general) (routine) without abnormal findings: Secondary | ICD-10-CM

## 2020-10-27 DIAGNOSIS — R232 Flushing: Secondary | ICD-10-CM

## 2020-10-27 MED ORDER — DESVENLAFAXINE SUCCINATE ER 50 MG PO TB24: 50.0000 mg | ORAL_TABLET | Freq: Every day | ORAL | 3 refills | Status: DC

## 2020-10-27 NOTE — Progress Notes (Signed)
70 y.o. G0P0 Single Black or [place] American female here for breast and pelvic exam.  We have had extensive conversations about her menopausal hot flashes and treatment options.  She is on gabapentin for this and for spinal stenosis.  Dosage has increased.  She has suffered with these.    Still teaching a course a semester.  Still hiking.  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  This was too much so she recently gave this [place].  Anxiety has improved.  Is on prestique and wondered if this needed to be increased but as anxiety is better, does not need change but does need RFs.  Denies vaginal bleeding.    Had a breast bx done this summer.  Had micro calcifications.  Had a biopsy.    Patient's last menstrual period was 11/04/1990.          Sexually active: No.  H/O STD:  no  Health Maintenance: PCP:  Dr. Brigitte Pulse.  Last wellness appt was 08/2020.  Did blood work at that appt:  yes Vaccines are up to date:  Pt is aware that I do not have full vaccines documents Colonoscopy:  02/22/2021, normal.  Follow up 10 years MMG:  08/11/2020 receommended additional images BMD:  done two weeks ago Last pap smear:  09/20/2017 negative.   H/o abnormal pap smear:  no   reports that she has never smoked. She has never used smokeless tobacco. She reports current alcohol use. She reports that she does not use drugs.  Past Medical History:  Diagnosis Date   Anginal pain ([place])    Bursitis    left hip    Chronic constipation    DI (detrusor instability)    Femur fracture ([place]) 2011   Fibroid    Headache    Menopausal symptoms    Non-Hodgkin lymphoma ([place]) 1992   treated with CHOP   Osteoporosis    h/o 3 stress fractures   Postmenopausal HRT (hormone replacement therapy)    Tennis elbow Right    Trigger finger    bilateral     Past Surgical History:  Procedure Laterality Date   Biopsy for Non Hodgkins Lymphoma  1992   CARPAL TUNNEL RELEASE Right 09/11/2019   Procedure:  CARPAL TUNNEL RELEASE;  Surgeon: Leanora Cover, MD;  Location: [place];  Service: Orthopedics;  Laterality: Right;   DILATION AND CURETTAGE OF UTERUS     PELVIC LAPAROSCOPY     TOE SURGERY     TRIGGER FINGER RELEASE Left 08/04/2018   Procedure: RELEASE TRIGGER FINGER/A-1 PULLEY;  Surgeon: Leanora Cover, MD;  Location: [place];  Service: Orthopedics;  Laterality: Left;    Current Outpatient Medications  Medication Sig Dispense Refill   APPLE CIDER VINEGAR PO Take by mouth.     aspirin 81 MG EC tablet TAKE 1 TABLET BY MOUTH EVERY DAY 30 tablet 1   brimonidine (ALPHAGAN) 0.2 % ophthalmic solution 1 drop 2 (two) times daily.     Calcium Carbonate-Vitamin D (CALCIUM + D PO) Take 1 tablet by mouth 2 (two) times daily.      Cetirizine HCl (ZYRTEC ALLERGY PO) Take 1 tablet by mouth daily as needed.      Cholecalciferol (VITAMIN D PO) Take 1 tablet by mouth 2 (two) times daily.      CINNAMON PO Take by mouth.     Cyanocobalamin (VITAMIN B 12 PO) Take 1 tablet by mouth daily.  desvenlafaxine (PRISTIQ) 50 MG 24 hr tablet TAKE 1 TABLET BY MOUTH EVERY DAY 90 tablet 1   gabapentin (NEURONTIN) 300 MG capsule Take 1 pill in the morning (300 mg). Take 2 pills in the evening (600 mg). 90 capsule 2   Multiple Vitamin (MULTIVITAMIN) tablet Take 1 tablet by mouth daily.     pravastatin (PRAVACHOL) 20 MG tablet TAKE 1 TAB EVERY EVENING. 90 tablet 3   rizatriptan (MAXALT-MLT) 10 MG disintegrating tablet Take 10 mg by mouth as needed.      Turmeric (QC TUMERIC COMPLEX PO) Take by mouth.     nitroGLYCERIN (NITROSTAT) 0.4 MG SL tablet Place 1 tablet (0.4 mg total) under the tongue every 5 (five) minutes as needed for chest pain. 25 tablet 5   No current facility-administered medications for this visit.    Family History  Problem Relation Age of Onset   Diabetes Mother 58   Hypertension Mother    Alzheimer's disease Mother    Glaucoma Mother    Heart disease Mother     Hypertension Father    Alcoholism Father        deceased age 36   Cirrhosis Father    Diabetes Brother    Hypertension Brother    Colon cancer Maternal Uncle    Heart disease Maternal Grandmother    Breast cancer Cousin        Mat. 1st cousin-Age 45   Breast cancer Maternal Aunt        Age 9's   Heart disease Maternal Aunt    Other Paternal Grandmother        MI    Review of Systems  Constitutional: Negative.   Gastrointestinal: Negative.   Genitourinary: Negative.   Psychiatric/Behavioral: Negative.     Exam:   BP 136/63 (BP Location: Left Arm, Patient Position: Sitting, Cuff Size: Large)   Ht '5\' 4"'$  (1.626 m)   Wt 172 lb 3.2 oz (78.1 kg)   LMP 11/04/1990   BMI 29.56 kg/m   Height: '5\' 4"'$  (162.6 cm)  General appearance: alert, cooperative and appears stated age Breasts: normal appearance, no masses or tenderness Abdomen: soft, non-tender; bowel sounds normal; no masses,  no organomegaly Lymph nodes: Cervical, supraclavicular, and axillary nodes normal.  No abnormal inguinal nodes palpated Neurologic: Grossly normal  Pelvic: External genitalia:  no lesions              Urethra:  normal appearing urethra with no masses, tenderness or lesions              Bartholins and Skenes: normal                 Vagina: normal appearing vagina with atrophic changes and no discharge, no lesions              Cervix: no lesions              Pap taken: Yes.   Bimanual Exam:  Uterus:  normal size, contour, position, consistency, mobility, non-tender              Adnexa: normal adnexa and no mass, fullness, tenderness               Rectovaginal: Confirms               Anus:  normal sphincter tone, no lesions  Chaperone, Octaviano Batty, CMA, was present for exam.  Assessment/Plan: 1. Encntr for gyn exam (general) (routine) w/o abn findings - Cytology - PAP( [facility]) - MMG up to date - colonoscopy 2022 - BMD done with Dr. Brigitte Pulse - vaccines reviewed - lab work done with Dr. Brigitte Pulse in  June  2. Postmenopausal - on gabapentin.  She will call if needs RF.   3. Hot flashes  4. Fibroid - uterus small on exam today  5. Osteopenia, unspecified location  6. Anxiety - desvenlafaxine (PRISTIQ) 50 MG 24 hr tablet; Take 1 tablet (50 mg total) by mouth daily.  Dispense: 90 tablet; Refill: 3

## 2020-10-28 LAB — CYTOLOGY - PAP: Diagnosis: NEGATIVE

## 2020-11-04 ENCOUNTER — Other Ambulatory Visit (HOSPITAL_BASED_OUTPATIENT_CLINIC_OR_DEPARTMENT_OTHER): Payer: Self-pay

## 2020-11-04 MED ORDER — GABAPENTIN 300 MG PO CAPS: ORAL_CAPSULE | ORAL | 11 refills | Status: DC

## 2020-12-15 DIAGNOSIS — S5011XA Contusion of right forearm, initial encounter: Secondary | ICD-10-CM | POA: Diagnosis not present

## 2020-12-15 DIAGNOSIS — M79645 Pain in left finger(s): Secondary | ICD-10-CM | POA: Diagnosis not present

## 2020-12-15 DIAGNOSIS — W19XXXA Unspecified fall, initial encounter: Secondary | ICD-10-CM | POA: Diagnosis not present

## 2020-12-16 DIAGNOSIS — M79631 Pain in right forearm: Secondary | ICD-10-CM | POA: Diagnosis not present

## 2020-12-16 DIAGNOSIS — M7989 Other specified soft tissue disorders: Secondary | ICD-10-CM | POA: Diagnosis not present

## 2020-12-16 DIAGNOSIS — M79645 Pain in left finger(s): Secondary | ICD-10-CM | POA: Diagnosis not present

## 2021-01-02 DIAGNOSIS — H401232 Low-tension glaucoma, bilateral, moderate stage: Secondary | ICD-10-CM | POA: Diagnosis not present

## 2021-01-02 DIAGNOSIS — H5319 Other subjective visual disturbances: Secondary | ICD-10-CM | POA: Diagnosis not present

## 2021-01-18 DIAGNOSIS — L659 Nonscarring hair loss, unspecified: Secondary | ICD-10-CM | POA: Diagnosis not present

## 2021-02-01 DIAGNOSIS — S63637A Sprain of interphalangeal joint of left little finger, initial encounter: Secondary | ICD-10-CM | POA: Diagnosis not present

## 2021-02-01 DIAGNOSIS — M79645 Pain in left finger(s): Secondary | ICD-10-CM | POA: Diagnosis not present

## 2021-02-06 DIAGNOSIS — S63637A Sprain of interphalangeal joint of left little finger, initial encounter: Secondary | ICD-10-CM | POA: Diagnosis not present

## 2021-02-06 DIAGNOSIS — M25642 Stiffness of left hand, not elsewhere classified: Secondary | ICD-10-CM | POA: Diagnosis not present

## 2021-02-16 DIAGNOSIS — M810 Age-related osteoporosis without current pathological fracture: Secondary | ICD-10-CM | POA: Diagnosis not present

## 2021-03-19 ENCOUNTER — Other Ambulatory Visit: Payer: Self-pay | Admitting: Interventional Cardiology

## 2021-03-22 DIAGNOSIS — H2513 Age-related nuclear cataract, bilateral: Secondary | ICD-10-CM | POA: Diagnosis not present

## 2021-03-22 DIAGNOSIS — G43809 Other migraine, not intractable, without status migrainosus: Secondary | ICD-10-CM | POA: Diagnosis not present

## 2021-03-22 DIAGNOSIS — H401232 Low-tension glaucoma, bilateral, moderate stage: Secondary | ICD-10-CM | POA: Diagnosis not present

## 2021-04-06 DIAGNOSIS — L669 Cicatricial alopecia, unspecified: Secondary | ICD-10-CM | POA: Diagnosis not present

## 2021-06-14 DIAGNOSIS — L669 Cicatricial alopecia, unspecified: Secondary | ICD-10-CM | POA: Diagnosis not present

## 2021-06-16 ENCOUNTER — Other Ambulatory Visit: Payer: Self-pay | Admitting: Interventional Cardiology

## 2021-07-05 ENCOUNTER — Other Ambulatory Visit: Payer: Self-pay | Admitting: Interventional Cardiology

## 2021-07-24 DIAGNOSIS — G43809 Other migraine, not intractable, without status migrainosus: Secondary | ICD-10-CM | POA: Diagnosis not present

## 2021-07-24 DIAGNOSIS — H5319 Other subjective visual disturbances: Secondary | ICD-10-CM | POA: Diagnosis not present

## 2021-07-24 DIAGNOSIS — H401232 Low-tension glaucoma, bilateral, moderate stage: Secondary | ICD-10-CM | POA: Diagnosis not present

## 2021-07-24 DIAGNOSIS — H2513 Age-related nuclear cataract, bilateral: Secondary | ICD-10-CM | POA: Diagnosis not present

## 2021-07-26 NOTE — Progress Notes (Unsigned)
Cardiology Office Note:    Date:  07/27/2021   ID:  Deborah Fisher, DOB Oct 29, 1950, MRN [id number]  PCP:  Ginger Organ., MD  Cardiologist:  Sinclair Grooms, MD   Referring MD: Ginger Organ., MD   Chief Complaint  Patient presents with   Follow-up    Angina pectoris with possible microvascular dysfunction/angina.    History of Present Illness:    Deborah Fisher is a 71 y.o. female with a hx of of non Hodgkin's lymphoma referred by Dr. Brigitte Pulse for evaluation of chest pain.  Prior cardiac evaluation by Dr. 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   He feels well.  She is exercising regularly.  Rare episodes of chest discomfort.  Occasional dizziness and dyspnea when walking up an incline.  She went for an 8 mile walk this morning.  Not really needing to use nitroglycerin to have any significant symptoms.  Asks about using Veozha.  Past Medical History:  Diagnosis Date   Anginal pain ([place])    Bursitis    left hip    Chronic constipation    DI (detrusor instability)    Femur fracture ([place]) 2011   Fibroid    Headache    Menopausal symptoms    Non-Hodgkin lymphoma ([place]) 1992   treated with CHOP   Osteoporosis    h/o 3 stress fractures   Tennis elbow Right    Trigger finger    bilateral     Past Surgical History:  Procedure Laterality Date   Biopsy for Non Hodgkins Lymphoma  1992   CARPAL TUNNEL RELEASE Right 09/11/2019   Procedure: CARPAL TUNNEL RELEASE;  Surgeon: Leanora Cover, MD;  Location: [place];  Service: Orthopedics;  Laterality: Right;   DILATION AND CURETTAGE OF UTERUS     PELVIC LAPAROSCOPY     TOE SURGERY     TRIGGER FINGER RELEASE Left 08/04/2018   Procedure: RELEASE TRIGGER FINGER/A-1 PULLEY;  Surgeon: Leanora Cover, MD;  Location: [place];  Service: Orthopedics;  Laterality: Left;    Current Medications: Current Meds  Medication Sig   APPLE CIDER VINEGAR PO Take by mouth.    aspirin 81 MG EC tablet TAKE 1 TABLET BY MOUTH EVERY DAY   brimonidine (ALPHAGAN) 0.2 % ophthalmic solution 1 drop 2 (two) times daily.   Calcium Carbonate-Vitamin D (CALCIUM + D PO) Take 1 tablet by mouth 2 (two) times daily.    Cetirizine HCl (ZYRTEC ALLERGY PO) Take 1 tablet by mouth daily as needed.    Cholecalciferol (VITAMIN D PO) Take 1 tablet by mouth 2 (two) times daily.    CINNAMON PO Take by mouth.   Cyanocobalamin (VITAMIN B 12 PO) Take 1 tablet by mouth daily.    desvenlafaxine (PRISTIQ) 50 MG 24 hr tablet Take 1 tablet (50 mg total) by mouth daily.   gabapentin (NEURONTIN) 300 MG capsule Take 1 pill in the morning (300 mg). Take 2 pills in the evening (600 mg).   Multiple Vitamin (MULTIVITAMIN) tablet Take 1 tablet by mouth daily.   pravastatin (PRAVACHOL) 20 MG tablet TAKE 1 TABLET BY MOUTH EVERY EVENING   rizatriptan (MAXALT-MLT) 10 MG disintegrating tablet Take 10 mg by mouth as needed.    Turmeric (QC TUMERIC COMPLEX PO) Take by mouth.     Allergies:   Iodinated contrast media   Social History   Socioeconomic History   Marital status: Single    Spouse name: Not  on file   Number of children: 0   Years of education: PhD   Highest education level: Not on file  Occupational History    Employer: [employer]    Comment: Ass. Dean and Professor  Tobacco Use   Smoking status: Never   Smokeless tobacco: Never  Vaping Use   Vaping Use: Never used  Substance and Sexual Activity   Alcohol use: Yes    Comment: social   Drug use: No   Sexual activity: Not Currently    Partners: Male    Birth control/protection: Post-menopausal  Other Topics Concern   Not on file  Social History Narrative   Not on file   Social Determinants of Health   Financial Resource Strain: Not on file  Food Insecurity: Not on file  Transportation Needs: Not on file  Physical Activity: Not on file  Stress: Not on file  Social Connections: Not on file     Family History: The patient's family  history includes Alcoholism in her father; Alzheimer's disease in her mother; Breast cancer in her cousin and maternal aunt; Cirrhosis in her father; Colon cancer in her maternal uncle; Diabetes in her brother; Diabetes (age of onset: 81) in her mother; Glaucoma in her mother; Heart disease in her maternal aunt, maternal grandmother, and mother; Hypertension in her brother, father, and mother; Other in her paternal grandmother.  ROS:   Please see the history of present illness.    No episodes of syncope or other problems.  All other systems reviewed and are negative.  EKGs/Labs/Other Studies Reviewed:    The following studies were reviewed today:  2D Doppler echocardiogram 2020 Study Conclusions   - Left ventricle: The cavity size was normal. Systolic function was    normal. The estimated ejection fraction was in the range of 55%    to 60%. Wall motion was normal; there were no regional wall    motion abnormalities. Left ventricular diastolic function    parameters were normal.  - Atrial septum: No defect or patent foramen ovale was identified.   Impressions:   - Normal GLS -18.3.   Coronary CTA 2020 MPRESSION: 1.  Coronary artery calcium score 0 Agatston units.   2.  No significant coronary disease noted  EKG:  EKG sinus rhythm, first-degree AV block at 274 ms, QS pattern V1 and V2.  And otherwise poor R wave progression.  When compared to June 06, 2020, the PR interval is marginally longer on today's tracing have an increase from 270 ms.  Recent Labs: No results found for requested labs within last 8760 hours.  Recent Lipid Panel    Component Value Date/Time   CHOL 221 (H) 12/19/2017 0000   TRIG 65 12/19/2017 0000   HDL 122 12/19/2017 0000   CHOLHDL 1.8 12/19/2017 0000   LDLCALC 86 12/19/2017 0000    Physical Exam:    VS:  Ht '5\' 4"'$  (1.626 m)   LMP 11/04/1990   BMI 29.56 kg/m     Wt Readings from Last 3 Encounters:  10/27/20 172 lb 3.2 oz (78.1 kg)  08/22/20 170  lb (77.1 kg)  06/06/20 173 lb (78.5 kg)     GEN: Healthy appearing, slightly overweight.. No acute distress HEENT: Normal NECK: No JVD. LYMPHATICS: No lymphadenopathy CARDIAC: No murmur. RRR no gallop, or edema. VASCULAR:  Normal Pulses. No bruits. RESPIRATORY:  Clear to auscultation without rales, wheezing or rhonchi  ABDOMEN: Soft, non-tender, non-distended, No pulsatile mass, MUSCULOSKELETAL: No deformity  SKIN: Warm and  dry NEUROLOGIC:  Alert and oriented x 3 PSYCHIATRIC:  Normal affect   ASSESSMENT:    1. Angina pectoris ([place])   2. First degree AV block   3. Hyperlipidemia LDL goal <70   4. History of lymphoma    PLAN:    In order of problems listed above:  Minimal if any symptoms. Discussed with the patient in terms of evidence of conduction system disease and anticipate his symptoms should not progress to high-grade AV block. Suboptimal LDL but coronary calcium score is 0.  Would not chase this.  Recommended consideration of calcium score in 2025 which will be 5 years post her initial study.  Overall education and awareness concerning primary/secondary risk prevention was discussed in detail: LDL less than 70, hemoglobin A1c less than 7, blood pressure target less than 130/80 mmHg, >150 minutes of moderate aerobic activity per week, avoidance of smoking, weight control (via diet and exercise), and continued surveillance/management of/for obstructive sleep apnea.   Consider exercise treadmill test if she continues to experience dyspnea on exertion.   Medication Adjustments/Labs and Tests Ordered: Current medicines are reviewed at length with the patient today.  Concerns regarding medicines are outlined above.  No orders of the defined types were placed in this encounter.  No orders of the defined types were placed in this encounter.   There are no Patient Instructions on file for this visit.   Signed, Sinclair Grooms, MD  07/27/2021 3:40 PM    [facility]

## 2021-07-27 ENCOUNTER — Encounter: Payer: Self-pay | Admitting: Interventional Cardiology

## 2021-07-27 ENCOUNTER — Ambulatory Visit: Payer: Medicare PPO | Admitting: Interventional Cardiology

## 2021-07-27 VITALS — BP 130/80 | HR 67 | Ht 64.0 in | Wt 168.2 lb

## 2021-07-27 DIAGNOSIS — E785 Hyperlipidemia, unspecified: Secondary | ICD-10-CM | POA: Diagnosis not present

## 2021-07-27 DIAGNOSIS — Z8572 Personal history of non-Hodgkin lymphomas: Secondary | ICD-10-CM | POA: Diagnosis not present

## 2021-07-27 DIAGNOSIS — I209 Angina pectoris, unspecified: Secondary | ICD-10-CM

## 2021-07-27 DIAGNOSIS — I440 Atrioventricular block, first degree: Secondary | ICD-10-CM

## 2021-07-27 MED ORDER — NITROGLYCERIN 0.4 MG SL SUBL: 0.4000 mg | SUBLINGUAL_TABLET | SUBLINGUAL | 3 refills | Status: AC | PRN

## 2021-07-27 NOTE — Patient Instructions (Signed)

## 2021-08-03 ENCOUNTER — Ambulatory Visit (HOSPITAL_BASED_OUTPATIENT_CLINIC_OR_DEPARTMENT_OTHER): Payer: Medicare PPO | Admitting: Obstetrics & Gynecology

## 2021-08-03 ENCOUNTER — Encounter (HOSPITAL_BASED_OUTPATIENT_CLINIC_OR_DEPARTMENT_OTHER): Payer: Self-pay | Admitting: Obstetrics & Gynecology

## 2021-08-03 VITALS — BP 121/62 | HR 66 | Ht 64.5 in | Wt 167.4 lb

## 2021-08-03 DIAGNOSIS — Z79899 Other long term (current) drug therapy: Secondary | ICD-10-CM | POA: Diagnosis not present

## 2021-08-03 DIAGNOSIS — R232 Flushing: Secondary | ICD-10-CM

## 2021-08-03 NOTE — Progress Notes (Signed)
GYNECOLOGY  VISIT  CC:   discuss new medication  HPI: 71 y.o. G0P0 Single Black or [place] American female here for discussion of new medication, Veozah, for hot flash treatment.  Medication just approved by FDA and pt would like to know more about this.  She has done a lot of research.  Knows this is an oral medication.  Aware she will need to have live enzymes monitored.  Does not know about insurance coverage.  Medication now yet in orders in Epic and I checked with two chain pharmacies about wether in stock last week and it was not.  Pt ok with having liver enzymes obtained today.  No hx of renal disease.  Has hx of microvascular angina so should not be on HRT.  Has been on gabepentin and SSRIs without significant improvement.  Has really struggles with hot flashes for many years.     Past Medical History:  Diagnosis Date   Anginal pain ([place])    Bursitis    left hip    Chronic constipation    DI (detrusor instability)    Femur fracture ([place]) 2011   Fibroid    Headache    Menopausal symptoms    Non-Hodgkin lymphoma ([place]) 1992   treated with CHOP   Osteoporosis    h/o 3 stress fractures   Tennis elbow Right    Trigger finger    bilateral     MEDS:   Current Outpatient Medications on File Prior to Visit  Medication Sig Dispense Refill   alendronate (FOSAMAX) 70 MG tablet Take 1 tablet by mouth once a week.     APPLE CIDER VINEGAR PO Take by mouth.     aspirin 81 MG EC tablet TAKE 1 TABLET BY MOUTH EVERY DAY 30 tablet 1   brimonidine (ALPHAGAN) 0.2 % ophthalmic solution 1 drop 2 (two) times daily.     Calcium Carbonate-Vitamin D (CALCIUM + D PO) Take 1 tablet by mouth 2 (two) times daily.      Cetirizine HCl (ZYRTEC ALLERGY PO) Take 1 tablet by mouth daily as needed.      Cholecalciferol (VITAMIN D PO) Take 1 tablet by mouth 2 (two) times daily.      CINNAMON PO Take by mouth.     Cyanocobalamin (VITAMIN B 12 PO) Take 1 tablet by mouth daily.      desvenlafaxine (PRISTIQ) 50  MG 24 hr tablet Take 1 tablet (50 mg total) by mouth daily. 90 tablet 3   gabapentin (NEURONTIN) 300 MG capsule Take 1 pill in the morning (300 mg). Take 2 pills in the evening (600 mg). 90 capsule 11   latanoprost (XALATAN) 0.005 % ophthalmic solution Place 1 drop into both eyes at bedtime.     Multiple Vitamin (MULTIVITAMIN) tablet Take 1 tablet by mouth daily.     nitroGLYCERIN (NITROSTAT) 0.4 MG SL tablet Place 1 tablet (0.4 mg total) under the tongue every 5 (five) minutes as needed for chest pain. 25 tablet 3   pravastatin (PRAVACHOL) 20 MG tablet TAKE 1 TABLET BY MOUTH EVERY EVENING 90 tablet 0   rizatriptan (MAXALT-MLT) 10 MG disintegrating tablet Take 10 mg by mouth as needed.      Turmeric (QC TUMERIC COMPLEX PO) Take by mouth.     No current facility-administered medications on file prior to visit.    ALLERGIES: Iodinated contrast media  SH:  single, non smoker  Review of Systems  Constitutional:        Hot flashes  PHYSICAL EXAMINATION:    Physical Exam Constitutional:      Appearance: Normal appearance.  Neurological:     General: No focal deficit present.     Mental Status: She is alert.  Psychiatric:        Mood and Affect: Mood normal.        Behavior: Behavior normal.    Assessment/Plan: 1. Hot flashes - will contact local rep to see if can obtain samples for Veozah.  She very much would like to try this.  Side effects from information from FDA reviewed.  Pt aware with medicare may need to do appeal for medication.  She is willing to have baseline blood work today.  2. High risk medication use - Hepatic function panel

## 2021-08-04 LAB — HEPATIC FUNCTION PANEL
ALT: 26 IU/L (ref 0–32)
AST: 35 IU/L (ref 0–40)
Albumin: 4.6 g/dL (ref 3.7–4.7)
Alkaline Phosphatase: 79 IU/L (ref 44–121)
Bilirubin Total: 0.4 mg/dL (ref 0.0–1.2)
Bilirubin, Direct: 0.13 mg/dL (ref 0.00–0.40)
Total Protein: 7.5 g/dL (ref 6.0–8.5)

## 2021-08-16 DIAGNOSIS — L669 Cicatricial alopecia, unspecified: Secondary | ICD-10-CM | POA: Diagnosis not present

## 2021-08-28 ENCOUNTER — Other Ambulatory Visit (HOSPITAL_BASED_OUTPATIENT_CLINIC_OR_DEPARTMENT_OTHER): Payer: Self-pay | Admitting: Obstetrics & Gynecology

## 2021-08-28 DIAGNOSIS — R232 Flushing: Secondary | ICD-10-CM

## 2021-08-28 MED ORDER — VEOZAH 45 MG PO TABS: 1.0000 | ORAL_TABLET | Freq: Every day | ORAL | 5 refills | Status: DC

## 2021-09-14 ENCOUNTER — Other Ambulatory Visit: Payer: Self-pay | Admitting: Internal Medicine

## 2021-09-14 DIAGNOSIS — Z1231 Encounter for screening mammogram for malignant neoplasm of breast: Secondary | ICD-10-CM

## 2021-09-15 ENCOUNTER — Encounter (HOSPITAL_BASED_OUTPATIENT_CLINIC_OR_DEPARTMENT_OTHER): Payer: Self-pay | Admitting: Obstetrics & Gynecology

## 2021-09-17 ENCOUNTER — Other Ambulatory Visit: Payer: Self-pay | Admitting: Interventional Cardiology

## 2021-09-20 ENCOUNTER — Ambulatory Visit
Admission: RE | Admit: 2021-09-20 | Discharge: 2021-09-20 | Disposition: A | Discharge status: Home / Self Care | Payer: Medicare PPO | Source: Ambulatory Visit | Attending: Internal Medicine | Admitting: Internal Medicine

## 2021-09-20 DIAGNOSIS — Z1231 Encounter for screening mammogram for malignant neoplasm of breast: Secondary | ICD-10-CM

## 2021-09-24 ENCOUNTER — Ambulatory Visit (HOSPITAL_COMMUNITY): Payer: Medicare PPO

## 2021-09-24 ENCOUNTER — Ambulatory Visit (INDEPENDENT_AMBULATORY_CARE_PROVIDER_SITE_OTHER): Payer: Medicare PPO

## 2021-09-24 ENCOUNTER — Encounter (HOSPITAL_COMMUNITY): Payer: Self-pay

## 2021-09-24 ENCOUNTER — Ambulatory Visit (HOSPITAL_COMMUNITY)
Admission: RE | Admit: 2021-09-24 | Discharge: 2021-09-24 | Disposition: A | Discharge status: Home / Self Care | Payer: Medicare PPO | Source: Ambulatory Visit | Attending: Internal Medicine | Admitting: Internal Medicine

## 2021-09-24 VITALS — BP 159/97 | HR 71 | Temp 98.7°F | Resp 16

## 2021-09-24 DIAGNOSIS — S60222A Contusion of left hand, initial encounter: Secondary | ICD-10-CM | POA: Diagnosis not present

## 2021-09-24 DIAGNOSIS — S63602A Unspecified sprain of left thumb, initial encounter: Secondary | ICD-10-CM | POA: Diagnosis not present

## 2021-09-24 DIAGNOSIS — M79642 Pain in left hand: Secondary | ICD-10-CM | POA: Diagnosis not present

## 2021-09-24 DIAGNOSIS — W19XXXA Unspecified fall, initial encounter: Secondary | ICD-10-CM

## 2021-09-24 DIAGNOSIS — M19042 Primary osteoarthritis, left hand: Secondary | ICD-10-CM | POA: Diagnosis not present

## 2021-09-24 DIAGNOSIS — Z043 Encounter for examination and observation following other accident: Secondary | ICD-10-CM | POA: Diagnosis not present

## 2021-09-24 MED ORDER — IBUPROFEN 600 MG PO TABS: 600.0000 mg | ORAL_TABLET | Freq: Four times a day (QID) | ORAL | 0 refills | Status: DC | PRN

## 2021-09-24 MED ORDER — ACETAMINOPHEN 500 MG PO TABS: 1000.0000 mg | ORAL_TABLET | Freq: Four times a day (QID) | ORAL | 0 refills | Status: AC | PRN

## 2021-09-24 NOTE — Discharge Instructions (Addendum)
Your x-ray was negative for fracture today to your thumb. Continue to ice, elevate, rest, and compress your thumb. Wear the splint provided at urgent care for the next 1 to 2 weeks as the swelling goes down and your thumb sprain heals.  Avoid getting the splint wet. If you continue to have worsening symptoms or discomfort persisting for more than 1 to 2 weeks, please follow-up with your hand or orthopedic specialist.  You may take ibuprofen 600 mg and Tylenol 1000 mg every 6 hours as needed with food for your pain and inflammation.   If you develop any new or worsening symptoms or do not improve in the next 2 to 3 days, please return.  If your symptoms are severe, please go to the emergency room.  Follow-up with your primary care provider for further evaluation and management of your symptoms as well as ongoing wellness visits.  I hope you feel better!

## 2021-09-24 NOTE — ED Triage Notes (Signed)
Pt report she was hiking yesterday and tripped and fell and landed on her L hand. Pt is having l thumb pain now with swelling a brushing.

## 2021-09-24 NOTE — ED Provider Notes (Signed)
[facility]    CSN: [id number] Arrival date & time: 09/24/21  1537      History   Chief Complaint Chief Complaint  Patient presents with   Hand Problem    [place] on hand yesterday while hiking. Bone in thumb might be broken. Thumb and adjacent hand area swollen. They have not responded to icing. The area hurt (throbs) and hurts to the touch. There are signs of bruising on my thumb (top and bottom). - Entered by patient   Hand Injury    HPI Deborah Fisher is a 71 y.o. female.   Patient presents to urgent care for evaluation of her left thumb injury and pain that happened yesterday while she was hiking.  Patient states that she was not looking when she was attempting to step up onto a wooden bridge and did not lift her leg high enough to step up onto the bridge correctly.  She fell forward and fell onto her outstretched left hand.  She is now having pain at the base of her left thumb.  Area is bruised.  Patient took 400 mg of ibuprofen at 9 PM last night and does not know if this helped her pain because she went to sleep.  She applied ice to the area 3 times yesterday and elevated the area to reduce the swelling.  She states that the swelling has gone down significantly since yesterday but she still has a lot of pain and is concerned that she may have fractured her left thumb.  Pain is a 7 on a scale of 0-10 at this time.  She is able to move the thumb joint in all directions without difficulty but range of motion is slightly limited due to tenderness.  Denies numbness and tingling to the bilateral upper extremities.  Denies blood thinner use, loss of consciousness, dizziness, and she did not hit her head during the incident.  She has never injured her left hand in the past.     Past Medical History:  Diagnosis Date   Anginal pain ([place])    Bursitis    left hip    Chronic constipation    DI (detrusor instability)    Femur fracture ([place]) 2011   Fibroid    Headache     Menopausal symptoms    Non-Hodgkin lymphoma ([place]) 1992   treated with CHOP   Osteoporosis    h/o 3 stress fractures   Tennis elbow Right    Trigger finger    bilateral     Patient Active Problem List   Diagnosis Date Noted   Hot flashes 08/03/2021   Spinal stenosis of lumbar region 06/11/2019   Bilateral carpal tunnel syndrome 03/18/2019   INOCA 12/22/2017   Abnormality of gait 01/25/2016   Left foot pain 01/01/2016   Foot pain, right 01/01/2016   Callus of foot 01/01/2016   Headache, migraine 02/10/2014   Osteoporosis    DI (detrusor instability)    Fibroid    Menopausal symptoms    Non-Hodgkin lymphoma ([place])     Past Surgical History:  Procedure Laterality Date   Biopsy for Non Hodgkins Lymphoma  1992   CARPAL TUNNEL RELEASE Right 09/11/2019   Procedure: CARPAL TUNNEL RELEASE;  Surgeon: Leanora Cover, MD;  Location: [place];  Service: Orthopedics;  Laterality: Right;   DILATION AND CURETTAGE OF UTERUS     PELVIC LAPAROSCOPY     TOE SURGERY     TRIGGER FINGER RELEASE Left 08/04/2018  Procedure: RELEASE TRIGGER FINGER/A-1 PULLEY;  Surgeon: Leanora Cover, MD;  Location: [facility];  Service: Orthopedics;  Laterality: Left;    OB History     Gravida  0   Para      Term      Preterm      AB      Living         SAB      IAB      Ectopic      Multiple      Live Births               Home Medications    Prior to Admission medications   Medication Sig Start Date End Date Taking? Authorizing Provider  acetaminophen (TYLENOL) 500 MG tablet Take 2 tablets (1,000 mg total) by mouth every 6 (six) hours as needed. 09/24/21  Yes Talbot Grumbling, FNP  ibuprofen (ADVIL) 600 MG tablet Take 1 tablet (600 mg total) by mouth every 6 (six) hours as needed. 09/24/21  Yes Talbot Grumbling, FNP  alendronate (FOSAMAX) 70 MG tablet Take 1 tablet by mouth once a week. 05/08/21   [provider]  APPLE CIDER VINEGAR PO Take  by mouth.    [provider]  aspirin 81 MG EC tablet TAKE 1 TABLET BY MOUTH EVERY DAY 04/10/18   Richardo Priest, MD  brimonidine (ALPHAGAN) 0.2 % ophthalmic solution 1 drop 2 (two) times daily. 01/28/19   [provider]  Calcium Carbonate-Vitamin D (CALCIUM + D PO) Take 1 tablet by mouth 2 (two) times daily.     [provider]  Cetirizine HCl (ZYRTEC ALLERGY PO) Take 1 tablet by mouth daily as needed.     [provider]  Cholecalciferol (VITAMIN D PO) Take 1 tablet by mouth 2 (two) times daily.     [provider]  CINNAMON PO Take by mouth.    [provider]  Cyanocobalamin (VITAMIN B 12 PO) Take 1 tablet by mouth daily.     [provider]  desvenlafaxine (PRISTIQ) 50 MG 24 hr tablet Take 1 tablet (50 mg total) by mouth daily. 10/27/20   Megan Salon, MD  Fezolinetant [facility]) 45 MG TABS Take 1 tablet by mouth daily. 08/28/21   Megan Salon, MD  gabapentin (NEURONTIN) 300 MG capsule Take 1 pill in the morning (300 mg). Take 2 pills in the evening (600 mg). 11/04/20   Megan Salon, MD  latanoprost (XALATAN) 0.005 % ophthalmic solution Place 1 drop into both eyes at bedtime. 11/23/20   [provider]  Multiple Vitamin (MULTIVITAMIN) tablet Take 1 tablet by mouth daily.    [provider]  nitroGLYCERIN (NITROSTAT) 0.4 MG SL tablet Place 1 tablet (0.4 mg total) under the tongue every 5 (five) minutes as needed for chest pain. 07/27/21 10/25/21  Belva Crome, MD  pravastatin (PRAVACHOL) 20 MG tablet TAKE 1 TABLET BY MOUTH EVERY DAY IN THE EVENING 09/18/21   Belva Crome, MD  rizatriptan (MAXALT-MLT) 10 MG disintegrating tablet Take 10 mg by mouth as needed.  03/12/14   [provider]  Turmeric (QC TUMERIC COMPLEX PO) Take by mouth.    [provider]    Family History Family History  Problem Relation Age of Onset   Diabetes Mother 90   Hypertension Mother    Alzheimer's disease Mother     Glaucoma Mother    Heart disease Mother    Hypertension Father  Alcoholism Father        deceased age 76   Cirrhosis Father    Diabetes Brother    Hypertension Brother    Colon cancer Maternal Uncle    Heart disease Maternal Grandmother    Breast cancer Cousin        Mat. 1st cousin-Age 73   Breast cancer Maternal Aunt        Age 101's   Heart disease Maternal Aunt    Other Paternal Grandmother        MI    Social History Social History   Tobacco Use   Smoking status: Never   Smokeless tobacco: Never  Vaping Use   Vaping Use: Never used  Substance Use Topics   Alcohol use: Yes    Comment: social   Drug use: No     Allergies   Iodinated contrast media   Review of Systems Review of Systems Per HPI  Physical Exam Triage Vital Signs ED Triage Vitals  Enc Vitals Group     BP 09/24/21 1610 (!) 159/97     Pulse Rate 09/24/21 1610 71     Resp 09/24/21 1610 16     Temp 09/24/21 1610 98.7 F (37.1 C)     Temp src --      SpO2 09/24/21 1610 95 %     Weight --      Height --      Head Circumference --      Peak Flow --      Pain Score 09/24/21 1609 4     Pain Loc --      Pain Edu? --      Excl. in [place]? --    No data found.  Updated Vital Signs BP (!) 159/97   Pulse 71   Temp 98.7 F (37.1 C)   Resp 16   LMP 11/04/1990   SpO2 95%   Visual Acuity Right Eye Distance:   Left Eye Distance:   Bilateral Distance:    Right Eye Near:   Left Eye Near:    Bilateral Near:     Physical Exam Vitals and nursing note reviewed.  Constitutional:      Appearance: Normal appearance. She is not ill-appearing or toxic-appearing.     Comments: Very pleasant patient sitting on exam in position of comfort table in no acute distress.   HENT:     Head: Normocephalic and atraumatic.     Right Ear: Hearing and external ear normal.     Left Ear: Hearing and external ear normal.     Nose: Nose normal.     Mouth/Throat:     Lips: Pink.     Mouth: Mucous membranes are  moist.  Eyes:     General: Lids are normal. Vision grossly intact. Gaze aligned appropriately.     Extraocular Movements: Extraocular movements intact.     Conjunctiva/sclera: Conjunctivae normal.  Pulmonary:     Effort: Pulmonary effort is normal.  Abdominal:     Palpations: Abdomen is soft.  Musculoskeletal:        General: Swelling present.     Cervical back: Neck supple.     Comments: Left hand: Swelling and bruising present to the left thumb.  Swelling is present to the palmar aspect of the base of the left thumb as well as ecchymosis.  Patient able to make grip motion with left hand without difficulty.  Range of motion to the left thumb is full and mildly limited by  tenderness.  Range of motion to the left wrist is normal and without tenderness.  +2 radial pulses bilaterally.  Sensation is intact.  Capillary refill to the left hand is less than 3.  Skin:    General: Skin is warm and dry.     Capillary Refill: Capillary refill takes less than 2 seconds.     Findings: No rash.  Neurological:     General: No focal deficit present.     Mental Status: She is alert and oriented to person, place, and time. Mental status is at baseline.     Cranial Nerves: No dysarthria or facial asymmetry.     Gait: Gait is intact.  Psychiatric:        Mood and Affect: Mood normal.        Speech: Speech normal.        Behavior: Behavior normal.        Thought Content: Thought content normal.        Judgment: Judgment normal.       UC Treatments / Results  Labs (all labs ordered are listed, but only abnormal results are displayed) Labs Reviewed - No data to display  EKG   Radiology DG Hand Complete Left  Result Date: 09/24/2021 CLINICAL DATA:  Fall.  Landed on left hand. EXAM: LEFT HAND - COMPLETE 3+ VIEW COMPARISON:  None Available. FINDINGS: Mild triscaphe and thumb carpometacarpal joint space narrowing. Very mild degenerative changes of the interphalangeal joints diffusely. Neutral ulnar  variance. No acute fracture is seen. No dislocation. IMPRESSION: No acute fracture. Electronically Signed   By: Yvonne Kendall M.D.   On: 09/24/2021 16:51    Procedures Procedures (including critical care time)  Medications Ordered in UC Medications - No data to display  Initial Impression / Assessment and Plan / UC Course  I have reviewed the triage vital signs and the nursing notes.  Pertinent labs & imaging results that were available during my care of the patient were reviewed by me and considered in my medical decision making (see chart for details).  1.  Contusion of left hand X-ray negative for fracture to the left thumb.  Left hand x-ray is normal.  Thumb spica splint applied in clinic today.  Patient has orthopedic doctor and hand surgeon from previous orthopedic injuries that she may call for follow-up.  *** Final Clinical Impressions(s) / UC Diagnoses   Final diagnoses:  Contusion of left hand, initial encounter  Sprain of left thumb, unspecified site of digit, initial encounter     Discharge Instructions      Your x-ray was negative for fracture today to your thumb. Continue to ice, elevate, rest, and compress your thumb. Wear the splint provided at urgent care for the next 1 to 2 weeks as the swelling goes down and your thumb sprain heals.  Avoid getting the splint wet. If you continue to have worsening symptoms or discomfort persisting for more than 1 to 2 weeks, please follow-up with your hand or orthopedic specialist.  You may take ibuprofen 600 mg and Tylenol 1000 mg every 6 hours as needed with food for your pain and inflammation.   If you develop any new or worsening symptoms or do not improve in the next 2 to 3 days, please return.  If your symptoms are severe, please go to the emergency room.  Follow-up with your primary care provider for further evaluation and management of your symptoms as well as ongoing wellness visits.  I hope you feel  better!   ED  Prescriptions     Medication Sig Dispense Auth. Provider   ibuprofen (ADVIL) 600 MG tablet Take 1 tablet (600 mg total) by mouth every 6 (six) hours as needed. 30 tablet Joella Prince M, FNP   acetaminophen (TYLENOL) 500 MG tablet Take 2 tablets (1,000 mg total) by mouth every 6 (six) hours as needed. 30 tablet Talbot Grumbling, FNP      PDMP not reviewed this encounter.

## 2021-09-25 ENCOUNTER — Other Ambulatory Visit: Payer: Self-pay | Admitting: Internal Medicine

## 2021-09-25 DIAGNOSIS — N644 Mastodynia: Secondary | ICD-10-CM

## 2021-09-28 DIAGNOSIS — M810 Age-related osteoporosis without current pathological fracture: Secondary | ICD-10-CM | POA: Diagnosis not present

## 2021-09-28 DIAGNOSIS — R7989 Other specified abnormal findings of blood chemistry: Secondary | ICD-10-CM | POA: Diagnosis not present

## 2021-09-28 DIAGNOSIS — E785 Hyperlipidemia, unspecified: Secondary | ICD-10-CM | POA: Diagnosis not present

## 2021-09-28 DIAGNOSIS — R739 Hyperglycemia, unspecified: Secondary | ICD-10-CM | POA: Diagnosis not present

## 2021-09-28 DIAGNOSIS — Z79899 Other long term (current) drug therapy: Secondary | ICD-10-CM | POA: Diagnosis not present

## 2021-10-05 DIAGNOSIS — R82998 Other abnormal findings in urine: Secondary | ICD-10-CM | POA: Diagnosis not present

## 2021-10-05 DIAGNOSIS — I201 Angina pectoris with documented spasm: Secondary | ICD-10-CM | POA: Diagnosis not present

## 2021-10-05 DIAGNOSIS — R296 Repeated falls: Secondary | ICD-10-CM | POA: Diagnosis not present

## 2021-10-05 DIAGNOSIS — R7301 Impaired fasting glucose: Secondary | ICD-10-CM | POA: Diagnosis not present

## 2021-10-05 DIAGNOSIS — Z0000 Encounter for general adult medical examination without abnormal findings: Secondary | ICD-10-CM | POA: Diagnosis not present

## 2021-10-05 DIAGNOSIS — D696 Thrombocytopenia, unspecified: Secondary | ICD-10-CM | POA: Diagnosis not present

## 2021-10-05 DIAGNOSIS — E785 Hyperlipidemia, unspecified: Secondary | ICD-10-CM | POA: Diagnosis not present

## 2021-10-05 DIAGNOSIS — D72819 Decreased white blood cell count, unspecified: Secondary | ICD-10-CM | POA: Diagnosis not present

## 2021-10-05 DIAGNOSIS — M48061 Spinal stenosis, lumbar region without neurogenic claudication: Secondary | ICD-10-CM | POA: Diagnosis not present

## 2021-10-05 DIAGNOSIS — M810 Age-related osteoporosis without current pathological fracture: Secondary | ICD-10-CM | POA: Diagnosis not present

## 2021-10-10 ENCOUNTER — Ambulatory Visit
Admission: RE | Admit: 2021-10-10 | Discharge: 2021-10-10 | Disposition: A | Discharge status: Home / Self Care | Payer: Medicare PPO | Source: Ambulatory Visit | Attending: Internal Medicine | Admitting: Internal Medicine

## 2021-10-10 DIAGNOSIS — N6002 Solitary cyst of left breast: Secondary | ICD-10-CM | POA: Diagnosis not present

## 2021-10-10 DIAGNOSIS — R922 Inconclusive mammogram: Secondary | ICD-10-CM | POA: Diagnosis not present

## 2021-10-10 DIAGNOSIS — N644 Mastodynia: Secondary | ICD-10-CM

## 2021-10-30 ENCOUNTER — Other Ambulatory Visit (HOSPITAL_BASED_OUTPATIENT_CLINIC_OR_DEPARTMENT_OTHER): Payer: Self-pay | Admitting: Obstetrics & Gynecology

## 2021-10-30 ENCOUNTER — Ambulatory Visit (INDEPENDENT_AMBULATORY_CARE_PROVIDER_SITE_OTHER): Payer: Medicare PPO | Admitting: Obstetrics & Gynecology

## 2021-10-30 ENCOUNTER — Encounter (HOSPITAL_BASED_OUTPATIENT_CLINIC_OR_DEPARTMENT_OTHER): Payer: Self-pay | Admitting: Obstetrics & Gynecology

## 2021-10-30 VITALS — BP 162/86 | HR 63 | Ht 64.5 in | Wt 173.8 lb

## 2021-10-30 DIAGNOSIS — M810 Age-related osteoporosis without current pathological fracture: Secondary | ICD-10-CM

## 2021-10-30 DIAGNOSIS — F419 Anxiety disorder, unspecified: Secondary | ICD-10-CM

## 2021-10-30 DIAGNOSIS — Z79899 Other long term (current) drug therapy: Secondary | ICD-10-CM

## 2021-10-30 DIAGNOSIS — Z01419 Encounter for gynecological examination (general) (routine) without abnormal findings: Secondary | ICD-10-CM | POA: Diagnosis not present

## 2021-10-30 DIAGNOSIS — R232 Flushing: Secondary | ICD-10-CM

## 2021-10-30 MED ORDER — DESVENLAFAXINE SUCCINATE ER 50 MG PO TB24: 50.0000 mg | ORAL_TABLET | Freq: Every day | ORAL | 3 refills | Status: DC

## 2021-10-30 NOTE — Progress Notes (Unsigned)
71 y.o. G0P0 Single Black or [place] American female here for breast and pelvic exam.  I am also following her for hot flashes.  She is now on Veozah.  Doing well.  Did have some hot flashes yesterday.  She is overall doing really well.    Has questions about Prestiq and gabapentin.  Is using the gabapentin for spinal stenosis.  Dosage has increased.  I do not think she should make any changes with the gabapentin.  And I think we should make sure her liver enzymes are normal for at least 6 months before making any additional changes.    Has questions  Denies vaginal bleeding.  Patient's last menstrual period was 11/04/1990.          Sexually active: No.  H/O STD:  no  Health Maintenance: PCP:  Dr. Brigitte Pulse.  Last wellness appt was 10/05/2021.  Did blood work at that appt:   Vaccines are up to date:  pt aware I do not have pneumonia vaccination dates Colonoscopy:  02/22/2021 MMG:  10/10/2021 Negative.  Follow up recommended. BMD:  done at Dr. Raul Del office Last pap smear:  10/27/2020 Negative.   H/o abnormal pap smear:  no    reports that she has never smoked. She has never used smokeless tobacco. She reports current alcohol use. She reports that she does not use drugs.  Past Medical History:  Diagnosis Date   Anginal pain ([place])    Bursitis    left hip    Chronic constipation    DI (detrusor instability)    Femur fracture ([facility]) 2011   Fibroid    Headache    Menopausal symptoms    Non-Hodgkin lymphoma ([place]) 1992   treated with CHOP   Osteoporosis    h/o 3 stress fractures   Tennis elbow Right    Trigger finger    bilateral     Past Surgical History:  Procedure Laterality Date   Biopsy for Non Hodgkins Lymphoma  1992   CARPAL TUNNEL RELEASE Right 09/11/2019   Procedure: CARPAL TUNNEL RELEASE;  Surgeon: Leanora Cover, MD;  Location: [facility];  Service: Orthopedics;  Laterality: Right;   DILATION AND CURETTAGE OF UTERUS     PELVIC LAPAROSCOPY     TOE SURGERY      TRIGGER FINGER RELEASE Left 08/04/2018   Procedure: RELEASE TRIGGER FINGER/A-1 PULLEY;  Surgeon: Leanora Cover, MD;  Location: [facility];  Service: Orthopedics;  Laterality: Left;    Current Outpatient Medications  Medication Sig Dispense Refill   acetaminophen (TYLENOL) 500 MG tablet Take 2 tablets (1,000 mg total) by mouth every 6 (six) hours as needed. 30 tablet 0   alendronate (FOSAMAX) 70 MG tablet Take 1 tablet by mouth once a week.     APPLE CIDER VINEGAR PO Take by mouth.     aspirin 81 MG EC tablet TAKE 1 TABLET BY MOUTH EVERY DAY 30 tablet 1   brimonidine (ALPHAGAN) 0.2 % ophthalmic solution 1 drop 2 (two) times daily.     Calcium Carbonate-Vitamin D (CALCIUM + D PO) Take 1 tablet by mouth 2 (two) times daily.      Cetirizine HCl (ZYRTEC ALLERGY PO) Take 1 tablet by mouth daily as needed.      Cholecalciferol (VITAMIN D PO) Take 1 tablet by mouth 2 (two) times daily.      CINNAMON PO Take by mouth.     Cyanocobalamin (VITAMIN B 12 PO) Take 1 tablet by mouth daily.  Fezolinetant (VEOZAH) 45 MG TABS Take 1 tablet by mouth daily. 30 tablet 5   gabapentin (NEURONTIN) 300 MG capsule Take 1 pill in the morning (300 mg). Take 2 pills in the evening (600 mg). 90 capsule 11   ibuprofen (ADVIL) 600 MG tablet Take 1 tablet (600 mg total) by mouth every 6 (six) hours as needed. 30 tablet 0   latanoprost (XALATAN) 0.005 % ophthalmic solution Place 1 drop into both eyes at bedtime.     Multiple Vitamin (MULTIVITAMIN) tablet Take 1 tablet by mouth daily.     pravastatin (PRAVACHOL) 20 MG tablet TAKE 1 TABLET BY MOUTH EVERY DAY IN THE EVENING 90 tablet 2   rizatriptan (MAXALT-MLT) 10 MG disintegrating tablet Take 10 mg by mouth as needed.      Turmeric (QC TUMERIC COMPLEX PO) Take by mouth.     desvenlafaxine (PRISTIQ) 50 MG 24 hr tablet Take 1 tablet (50 mg total) by mouth daily. 90 tablet 3   nitroGLYCERIN (NITROSTAT) 0.4 MG SL tablet Place 1 tablet (0.4 mg total) under the  tongue every 5 (five) minutes as needed for chest pain. 25 tablet 3   No current facility-administered medications for this visit.    Family History  Problem Relation Age of Onset   Diabetes Mother 38   Hypertension Mother    Alzheimer's disease Mother    Glaucoma Mother    Heart disease Mother    Hypertension Father    Alcoholism Father        deceased age 54   Cirrhosis Father    Diabetes Brother    Hypertension Brother    Colon cancer Maternal Uncle    Heart disease Maternal Grandmother    Breast cancer Cousin        Mat. 1st cousin-Age 63   Breast cancer Maternal Aunt        Age 18's   Heart disease Maternal Aunt    Other Paternal Grandmother        MI    Review of Systems  Constitutional: Negative.   Genitourinary: Negative.     Exam:   BP (!) 162/86 (BP Location: Right Arm, Patient Position: Sitting, Cuff Size: Large)   Pulse 63   Ht 5' 4.5" (1.638 m) Comment: Reported  Wt 173 lb 12.8 oz (78.8 kg)   LMP 11/04/1990   BMI 29.37 kg/m   Height: 5' 4.5" (163.8 cm) (Reported)  General appearance: alert, cooperative and appears stated age Breasts: normal appearance, no masses or tenderness Abdomen: soft, non-tender; bowel sounds normal; no masses,  no organomegaly Lymph nodes: Cervical, supraclavicular, and axillary nodes normal.  No abnormal inguinal nodes palpated Neurologic: Grossly normal  Pelvic: External genitalia:  no lesions              Urethra:  normal appearing urethra with no masses, tenderness or lesions              Bartholins and Skenes: normal                 Vagina: normal appearing vagina with atrophic changes ***and no discharge, no lesions              Cervix: {exam; cervix:14595}              Pap taken: {yes no:314532} Bimanual Exam:  Uterus:  {exam; uterus:12215}              Adnexa: {exam; adnexa:12223}  Rectovaginal: Confirms               Anus:  normal sphincter tone, no lesions  Chaperone, ***, CMA, was present for  exam.  Assessment/Plan: There are no diagnoses linked to this encounter.

## 2021-11-08 ENCOUNTER — Ambulatory Visit: Payer: Medicare PPO | Attending: Internal Medicine | Admitting: Physical Therapy

## 2021-11-08 ENCOUNTER — Other Ambulatory Visit: Payer: Self-pay

## 2021-11-08 ENCOUNTER — Encounter: Payer: Self-pay | Admitting: Physical Therapy

## 2021-11-08 DIAGNOSIS — R2681 Unsteadiness on feet: Secondary | ICD-10-CM | POA: Insufficient documentation

## 2021-11-08 DIAGNOSIS — M6281 Muscle weakness (generalized): Secondary | ICD-10-CM | POA: Insufficient documentation

## 2021-11-08 NOTE — Therapy (Signed)
OUTPATIENT PHYSICAL THERAPY LOWER EXTREMITY EVALUATION   Patient Name: Deborah Fisher MRN: [id number] DOB:1950/11/02, 71 y.o., female Today's Date: 11/08/2021   PT End of Session - 11/08/21 1607     Visit Number 1    Number of Visits --   1-2x/week   Date for PT Re-Evaluation 01/03/22    Authorization Type humana MCR - FOTO    Progress Note Due on Visit 10    PT Start Time 1524    PT Stop Time 1603    PT Time Calculation (min) 39 min             Past Medical History:  Diagnosis Date   Anginal pain ([place])    Bursitis    left hip    Chronic constipation    DI (detrusor instability)    Femur fracture ([facility]) 2011   Fibroid    Headache    Menopausal symptoms    Non-Hodgkin lymphoma ([place]) 1992   treated with CHOP   Osteoporosis    h/o 3 stress fractures   Tennis elbow Right    Trigger finger    bilateral    Past Surgical History:  Procedure Laterality Date   Biopsy for Non Hodgkins Lymphoma  1992   CARPAL TUNNEL RELEASE Right 09/11/2019   Procedure: CARPAL TUNNEL RELEASE;  Surgeon: Leanora Cover, MD;  Location: [facility];  Service: Orthopedics;  Laterality: Right;   DILATION AND CURETTAGE OF UTERUS     PELVIC LAPAROSCOPY     TOE SURGERY     TRIGGER FINGER RELEASE Left 08/04/2018   Procedure: RELEASE TRIGGER FINGER/A-1 PULLEY;  Surgeon: Leanora Cover, MD;  Location: [facility];  Service: Orthopedics;  Laterality: Left;   Patient Active Problem List   Diagnosis Date Noted   Hot flashes 08/03/2021   Spinal stenosis of lumbar region 06/11/2019   Bilateral carpal tunnel syndrome 03/18/2019   INOCA 12/22/2017   Abnormality of gait 01/25/2016   Left foot pain 01/01/2016   Foot pain, right 01/01/2016   Callus of foot 01/01/2016   Headache, migraine 02/10/2014   Osteoporosis    DI (detrusor instability)    Fibroid    Menopausal symptoms    Non-Hodgkin lymphoma ([facility])     PCP: Ginger Organ., MD  REFERRING PROVIDER: Ginger Organ., MD  THERAPY DIAG:  Unsteadiness on feet - Plan: PT plan of care cert/re-cert  Muscle weakness - Plan: PT plan of care cert/re-cert  REFERRING DIAG: balance, recurrent falls while hiking  Rationale for Evaluation and Treatment Rehabilitation  SUBJECTIVE:  PERTINENT PAST HISTORY:  Hx of cancer, osteoporosis, spinal stenosis, vertigo       PRECAUTIONS: Other: osteoporosis  WEIGHT BEARING RESTRICTIONS No  FALLS:  Has patient fallen in last 6 months? Yes, Number of falls: >4 times  MOI/History of condition:  Onset date: >20 years  Deborah Fisher is a 71 y.o. female who presents to clinic with chief complaint of balance issue which compromise her hiking.  She has chronic vertigo and a past MD told her she has some type of permanent deficit d/t this.  She would like to improve her balance.  She also hikes with her dog which complicates her walking.  She feels that it's worse when she is not paying attention.  She falls about 1x every few weeks.  She hikes up to 10 miles at a time.    Red flags:  denies   Pain:  Are you having  pain? No   Occupation: retired  Administrator, sports: NA, uses poles while hiking  Hand Dominance: NA  Patient Goals/Specific Activities: improve balance and safety in hiking   OBJECTIVE:    GENERAL OBSERVATION/GAIT:   Forward flexed trunk in gait   LE MMT:  MMT Right 11/08/2021 Left 11/08/2021  Hip flexion (L2, L3) n n  Knee extension (L3) n n  Knee flexion n n  Hip abduction 3+ 3+  Hip extension 3+ 3+  Hip external rotation    Hip internal rotation    Hip adduction    Ankle dorsiflexion (L4)    Ankle plantarflexion (S1)    Ankle inversion    Ankle eversion    Great Toe ext (L5)    Grossly     (Blank rows = not tested, score listed is out of 5 possible points.  N = WNL, D = diminished, C = clear for gross weakness with myotome testing, * = concordant pain with testing)   Functional Tests  Eval (11/08/2021)    Balance Tandem:  unable Feet together on foam: <10'' Semi-tandem EC: unable Semi-tandem w/ head turns: unable     30'' STS: 11x  UE used? N                                                       PATIENT SURVEYS:  FOTO 61 -> 62   TODAY'S TREATMENT: Creating, reviewing, and completing below HEP   PATIENT EDUCATION:  POC, diagnosis, prognosis, HEP, and outcome measures.  Pt educated via explanation, demonstration, and handout (HEP).  Pt confirms understanding verbally.   HOME EXERCISE PROGRAM: Access Code: [email or web address] URL: [email or web address] Date: 11/08/2021 Prepared by: Shearon Balo  Program Notes COMPLETE BALANCE EXERCISE NEAR COUNTER FOR SUPPORT AS NEEDED  Exercises - Standing Romberg to 3/4 Tandem Stance  - 1 x daily - 7 x weekly - 1 sets - 3 reps - 40'' hold - Hip Flexor Stretch at Edge of Bed  - 2 x daily - 7 x weekly - 2 sets - 45 second hold - Bridge  - 1 x daily - 7 x weekly - 3 sets - 10 reps - 5-10'' hold - Clam with Resistance  - 1 x daily - 7 x weekly - 3 sets - 10 reps   ASSESSMENT:  CLINICAL IMPRESSION: Deborah Fisher is a 71 y.o. female who presents to clinic with signs and sxs consistent with balance deficit.  She does have some concurrent strength deficits and hip ext mobility deficits (likely d/t lumbar stenosis).  Significant balance deficits with narrow BOS, head turns, and unstable surfaces.  Pt would benefit from skilled PT to reduce fall risk while hiking.  OBJECTIVE IMPAIRMENTS: balance, hip mobility, hip strength  ACTIVITY LIMITATIONS: hiking without falls  PERSONAL FACTORS: See medical history and pertinent history   REHAB POTENTIAL: Good  CLINICAL DECISION MAKING: Stable/uncomplicated  EVALUATION COMPLEXITY: Low   GOALS:   SHORT TERM GOALS: Target date: 11/29/2021  Deborah Fisher will be >75% HEP compliant to improve carryover between sessions and facilitate independent management of condition  Evaluation (11/08/2021): ongoing Goal  status: INITIAL   LONG TERM GOALS: Target date: 01/03/2022  Deborah Fisher will improve FOTO score to 62 as a proxy for functional improvement  Evaluation/Baseline (11/08/2021): 61 Goal status: INITIAL   2.  Deborah Fisher will be able to stand  for >30'' in tandem stance, to show a significant improvement in balance in order to reduce fall risk   Evaluation/Baseline (11/08/2021): unable Goal status: INITIAL   3.  Cleotilde will be able to stand for >30'' in semi-tandem stance on foam, to show a significant improvement in balance in order to reduce fall risk   Evaluation/Baseline (11/08/2021): unable Goal status: INITIAL   4.  Cecelia will be able to stand for >30'' in semi-tandem stance with head turns, to show a significant improvement in balance in order to reduce fall risk   Evaluation/Baseline (11/08/2021): unable Goal status: INITIAL   PLAN: PT FREQUENCY: 1-2x/week  PT DURATION: 8 weeks (Ending 01/03/2022)  PLANNED INTERVENTIONS: Therapeutic exercises, Aquatic therapy, Therapeutic activity, Neuro Muscular re-education, Gait training, Patient/Family education, Joint mobilization, Dry Needling, Electrical stimulation, Spinal mobilization and/or manipulation, Moist heat, Taping, Vasopneumatic device, Ionotophoresis '4mg'$ /ml Dexamethasone, and Manual therapy  PLAN FOR NEXT SESSION: progressive balance, hip mobility, hip strengthening   Shearon Balo PT, DPT 11/08/2021, 4:12 PM

## 2021-11-15 DIAGNOSIS — H2513 Age-related nuclear cataract, bilateral: Secondary | ICD-10-CM | POA: Diagnosis not present

## 2021-11-15 DIAGNOSIS — H401232 Low-tension glaucoma, bilateral, moderate stage: Secondary | ICD-10-CM | POA: Diagnosis not present

## 2021-11-15 DIAGNOSIS — H04123 Dry eye syndrome of bilateral lacrimal glands: Secondary | ICD-10-CM | POA: Diagnosis not present

## 2021-11-16 DIAGNOSIS — L669 Cicatricial alopecia, unspecified: Secondary | ICD-10-CM | POA: Diagnosis not present

## 2021-11-29 ENCOUNTER — Other Ambulatory Visit (HOSPITAL_BASED_OUTPATIENT_CLINIC_OR_DEPARTMENT_OTHER): Payer: Medicare PPO

## 2021-11-29 DIAGNOSIS — Z79899 Other long term (current) drug therapy: Secondary | ICD-10-CM

## 2021-11-30 LAB — HEPATIC FUNCTION PANEL
ALT: 20 IU/L (ref 0–32)
AST: 34 IU/L (ref 0–40)
Albumin: 4.6 g/dL (ref 3.8–4.8)
Alkaline Phosphatase: 66 IU/L (ref 44–121)
Bilirubin Total: <0.2 mg/dL (ref 0.0–1.2)
Bilirubin, Direct: 0.1 mg/dL (ref 0.00–0.40)
Total Protein: 7.6 g/dL (ref 6.0–8.5)

## 2021-12-08 ENCOUNTER — Ambulatory Visit: Payer: Medicare PPO | Attending: Internal Medicine | Admitting: Physical Therapy

## 2021-12-08 ENCOUNTER — Encounter: Payer: Self-pay | Admitting: Physical Therapy

## 2021-12-08 DIAGNOSIS — M6281 Muscle weakness (generalized): Secondary | ICD-10-CM | POA: Diagnosis not present

## 2021-12-08 DIAGNOSIS — R2681 Unsteadiness on feet: Secondary | ICD-10-CM | POA: Insufficient documentation

## 2021-12-08 NOTE — Therapy (Addendum)
PHYSICAL THERAPY UNPLANNED DISCHARGE SUMMARY   Visits from Start of Care: 2  Current functional level related to goals / functional outcomes: Current status unknown   Remaining deficits: Current status unknown   Education / Equipment: Pt has not returned since visit listed below  Patient goals were not assessed. Patient is being discharged due to not returning since the last visit.  (the below note was addended to include the above D/C summary on 01/05/22)  OUTPATIENT PHYSICAL THERAPY TREATMENT NOTE   Patient Name: Deborah Fisher MRN: [id number] DOB:[birth year], 71 y.o., female Today's Date: 12/08/2021  PCP: Deborah Fisher., MD   REFERRING PROVIDER: Ginger Fisher., MD   PT End of Session - 12/08/21 1130     Visit Number 2    Number of Visits --   1-2x/week   Date for PT Re-Evaluation 01/03/22    Authorization Type humana MCR - FOTO    Progress Note Due on Visit 10    PT Start Time 1130    PT Stop Time 1211    PT Time Calculation (min) 41 min             Past Medical History:  Diagnosis Date   Anginal pain ([facility])    Bursitis    left hip    Chronic constipation    DI (detrusor instability)    Femur fracture ([facility]) 2011   Fibroid    Headache    Menopausal symptoms    Non-Hodgkin lymphoma ([facility]) 1992   treated with CHOP   Osteoporosis    h/o 3 stress fractures   Tennis elbow Right    Trigger finger    bilateral    Past Surgical History:  Procedure Laterality Date   Biopsy for Non Hodgkins Lymphoma  1992   CARPAL TUNNEL RELEASE Right 09/11/2019   Procedure: CARPAL TUNNEL RELEASE;  Surgeon: Deborah Cover, MD;  Location: [facility];  Service: Orthopedics;  Laterality: Right;   DILATION AND CURETTAGE OF UTERUS     PELVIC LAPAROSCOPY     TOE SURGERY     TRIGGER FINGER RELEASE Left 08/04/2018   Procedure: RELEASE TRIGGER FINGER/A-1 PULLEY;  Surgeon: Deborah Cover, MD;  Location: [facility];  Service: Orthopedics;  Laterality:  Left;   Patient Active Problem List   Diagnosis Date Noted   Hot flashes 08/03/2021   Spinal stenosis of lumbar region 06/11/2019   Bilateral carpal tunnel syndrome 03/18/2019   INOCA 12/22/2017   Abnormality of gait 01/25/2016   Left foot pain 01/01/2016   Foot pain, right 01/01/2016   Callus of foot 01/01/2016   Headache, migraine 02/10/2014   Osteoporosis    DI (detrusor instability)    Fibroid    Menopausal symptoms    Non-Hodgkin lymphoma ([facility])     THERAPY DIAG:  Unsteadiness on feet  Muscle weakness  REFERRING DIAG: balance, recurrent falls while hiking  PERTINENT HISTORY: Hx of cancer, osteoporosis, spinal stenosis, vertigo   PRECAUTIONS/RESTRICTIONS:   osteoporosis  SUBJECTIVE:  Pt reports that she has not fallen since last visit.  She feels that when she does a big step up while hiking she feels like she might fall backwards.  Pain:  Are you having pain? No  OBJECTIVE: (objective measures completed at initial evaluation unless otherwise dated)  OBJECTIVE:              GENERAL OBSERVATION/GAIT:  Forward flexed trunk in gait     LE MMT:   MMT Right 11/08/2021 Left 11/08/2021  Hip flexion (L2, L3) n n  Knee extension (L3) n n  Knee flexion n n  Hip abduction 3+ 3+  Hip extension 3+ 3+  Hip external rotation      Hip internal rotation      Hip adduction      Ankle dorsiflexion (L4)      Ankle plantarflexion (S1)      Ankle inversion      Ankle eversion      Great Toe ext (L5)      Grossly        (Blank rows = not tested, score listed is out of 5 possible points.  N = WNL, D = diminished, C = clear for gross weakness with myotome testing, * = concordant pain with testing)     Functional Tests   Eval (11/08/2021)      Balance Tandem: unable Feet together on foam: <10'' Semi-tandem EC: unable Semi-tandem w/ head turns: unable        30'' STS: 11x  UE used? N                                                                                                 PATIENT SURVEYS:  FOTO 61 -> 62     TODAY'S TREATMENT: Creating, reviewing, and completing below HEP     PATIENT EDUCATION:  POC, diagnosis, prognosis, HEP, and outcome measures.  Pt educated via explanation, demonstration, and handout (HEP).  Pt confirms understanding verbally.    HOME EXERCISE PROGRAM: Access Code: [email or web address] URL: [email or web address] Date: 12/08/2021 Prepared by: Deborah Fisher  Program Notes COMPLETE BALANCE EXERCISE NEAR COUNTER FOR SUPPORT AS NEEDED  Exercises - Tandem Stance  - 1 x daily - 7 x weekly - 1 sets - 4 reps - 27'' hold - Hip Flexor Stretch at Edge of Bed  - 1 x daily - 7 x weekly - 2 sets - 45 second hold - Figure 4 Bridge  - 1 x daily - 7 x weekly - 2 sets - 10 reps - Clam with Resistance  - 1 x daily - 7 x weekly - 3 sets - 10 reps     TREATMENT 10/6:  Therapeutic Exercise: - nu-step L5 51mwhile taking subjective and planning session with patient - hip flexor stretch + RF - 1' ea - fig 4 bridge - 2x10 ea - S/L hip abd - 2x10  Neuromuscular re-ed: - tandem stance - 45'' bouts - tandem on foam - 45'' bouts - wooden rocker board DF/PF   ASSESSMENT:   CLINICAL IMPRESSION: Deborah Fisher tolerated session well with no adverse reaction.  We concentrated on hip ext ROM followed by hip ext and hip abd strength.  We were able to progress balance to tandem stance from semi-tandem on level and uneven surfaces.  She does require CGA for safety.  She struggles with PF on rocker board.  HEP updated.   OBJECTIVE IMPAIRMENTS: balance, hip mobility, hip strength   ACTIVITY LIMITATIONS: hiking without falls  PERSONAL FACTORS: See medical history and pertinent history     REHAB POTENTIAL: Good   CLINICAL DECISION MAKING: Stable/uncomplicated   EVALUATION COMPLEXITY: Low     GOALS:     SHORT TERM GOALS: Target date: 11/29/2021   Deborah Fisher will be >75% HEP compliant to improve  carryover between sessions and facilitate independent management of condition   Evaluation (11/08/2021): ongoing Goal status: INITIAL     LONG TERM GOALS: Target date: 01/03/2022   Deborah Fisher will improve FOTO score to 62 as a proxy for functional improvement   Evaluation/Baseline (11/08/2021): 61 Goal status: INITIAL     2.  Deborah Fisher will be able to stand for >30'' in tandem stance, to show a significant improvement in balance in order to reduce fall risk    Evaluation/Baseline (11/08/2021): unable Goal status: INITIAL     3.  Julieth will be able to stand for >30'' in semi-tandem stance on foam, to show a significant improvement in balance in order to reduce fall risk    Evaluation/Baseline (11/08/2021): unable Goal status: INITIAL     4.  [place] will be able to stand for >30'' in semi-tandem stance with head turns, to show a significant improvement in balance in order to reduce fall risk    Evaluation/Baseline (11/08/2021): unable Goal status: INITIAL     PLAN: PT FREQUENCY: 1-2x/week   PT DURATION: 8 weeks (Ending 01/03/2022)   PLANNED INTERVENTIONS: Therapeutic exercises, Aquatic therapy, Therapeutic activity, Neuro Muscular re-education, Gait training, Patient/Family education, Joint mobilization, Dry Needling, Electrical stimulation, Spinal mobilization and/or manipulation, Moist heat, Taping, Vasopneumatic device, Ionotophoresis '4mg'$ /ml Dexamethasone, and Manual therapy   PLAN FOR NEXT SESSION: progressive balance, hip mobility, hip strengthening   Kevan Ny Janelie Goltz PT 12/08/2021, 11:30 AM

## 2021-12-11 ENCOUNTER — Ambulatory Visit: Payer: Medicare PPO | Admitting: Physical Therapy

## 2021-12-19 ENCOUNTER — Ambulatory Visit: Payer: Medicare PPO | Admitting: Physical Therapy

## 2021-12-26 ENCOUNTER — Encounter: Payer: Medicare PPO | Admitting: Physical Therapy

## 2022-01-10 DIAGNOSIS — M79672 Pain in left foot: Secondary | ICD-10-CM | POA: Diagnosis not present

## 2022-01-18 DIAGNOSIS — H1132 Conjunctival hemorrhage, left eye: Secondary | ICD-10-CM | POA: Diagnosis not present

## 2022-01-23 ENCOUNTER — Ambulatory Visit: Payer: Medicare PPO | Admitting: Sports Medicine

## 2022-02-14 ENCOUNTER — Other Ambulatory Visit (HOSPITAL_BASED_OUTPATIENT_CLINIC_OR_DEPARTMENT_OTHER): Payer: Self-pay

## 2022-02-14 DIAGNOSIS — R232 Flushing: Secondary | ICD-10-CM

## 2022-02-14 MED ORDER — VEOZAH 45 MG PO TABS: 1.0000 | ORAL_TABLET | Freq: Every day | ORAL | 8 refills | Status: DC

## 2022-03-15 ENCOUNTER — Other Ambulatory Visit (HOSPITAL_BASED_OUTPATIENT_CLINIC_OR_DEPARTMENT_OTHER): Payer: Self-pay | Admitting: *Deleted

## 2022-03-15 ENCOUNTER — Other Ambulatory Visit (HOSPITAL_BASED_OUTPATIENT_CLINIC_OR_DEPARTMENT_OTHER): Payer: Medicare PPO

## 2022-03-15 DIAGNOSIS — D2271 Melanocytic nevi of right lower limb, including hip: Secondary | ICD-10-CM | POA: Diagnosis not present

## 2022-03-15 DIAGNOSIS — Z79899 Other long term (current) drug therapy: Secondary | ICD-10-CM | POA: Diagnosis not present

## 2022-03-15 DIAGNOSIS — L821 Other seborrheic keratosis: Secondary | ICD-10-CM | POA: Diagnosis not present

## 2022-03-15 DIAGNOSIS — D2261 Melanocytic nevi of right upper limb, including shoulder: Secondary | ICD-10-CM | POA: Diagnosis not present

## 2022-03-15 DIAGNOSIS — L729 Follicular cyst of the skin and subcutaneous tissue, unspecified: Secondary | ICD-10-CM | POA: Diagnosis not present

## 2022-03-16 LAB — HEPATIC FUNCTION PANEL
ALT: 25 IU/L (ref 0–32)
AST: 40 IU/L (ref 0–40)
Albumin: 4.6 g/dL (ref 3.8–4.8)
Alkaline Phosphatase: 71 IU/L (ref 44–121)
Bilirubin Total: 0.2 mg/dL (ref 0.0–1.2)
Bilirubin, Direct: 0.09 mg/dL (ref 0.00–0.40)
Total Protein: 7.5 g/dL (ref 6.0–8.5)

## 2022-03-18 IMAGING — MG MM DIGITAL SCREENING BILAT W/ TOMO AND CAD
8 series · 8 of 24 positions shown · non-contrast
Comparison: Previous exam(s).

CLINICAL DATA: Screening.

EXAM:
DIGITAL SCREENING BILATERAL MAMMOGRAM WITH TOMOSYNTHESIS AND CAD
TECHNIQUE: Bilateral screening digital craniocaudal and mediolateral oblique
mammograms were obtained. Bilateral screening digital breast
tomosynthesis was performed. The images were evaluated with
computer-aided detection.

[R MLO synth-2D]
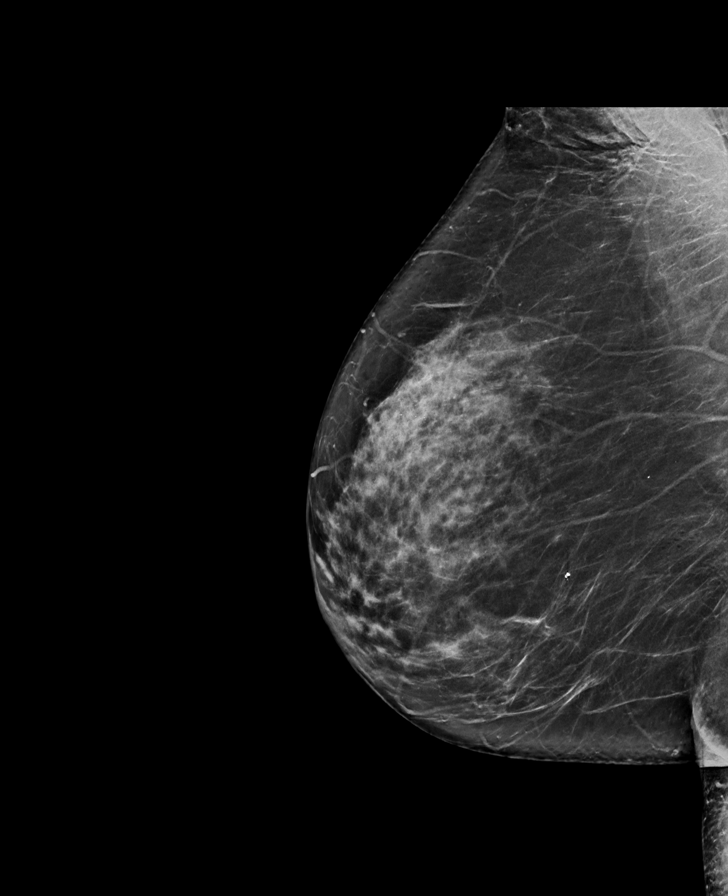

[L CC synth-2D]
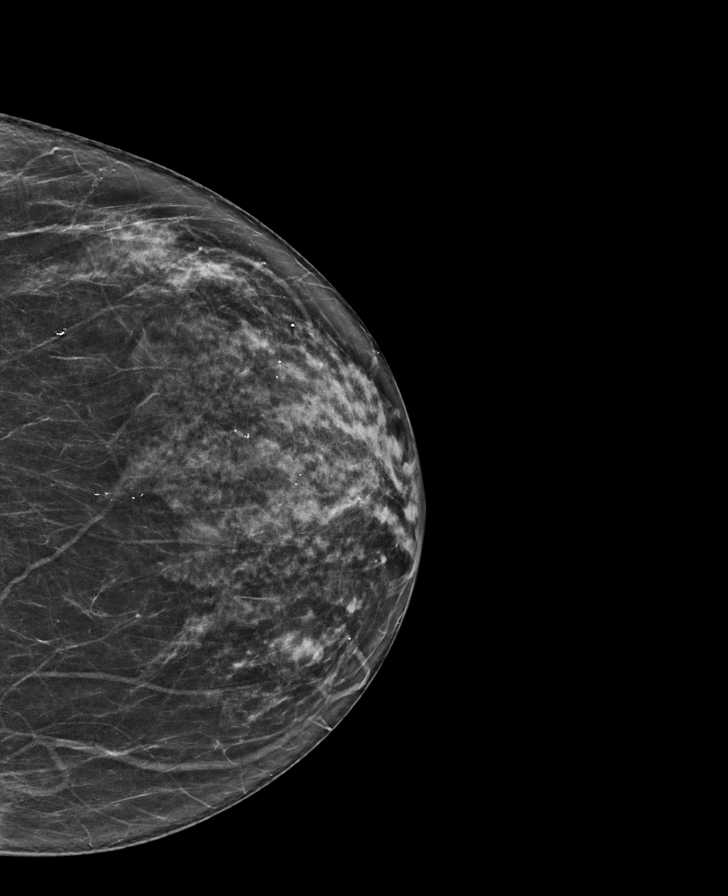

[L MLO synth-2D]
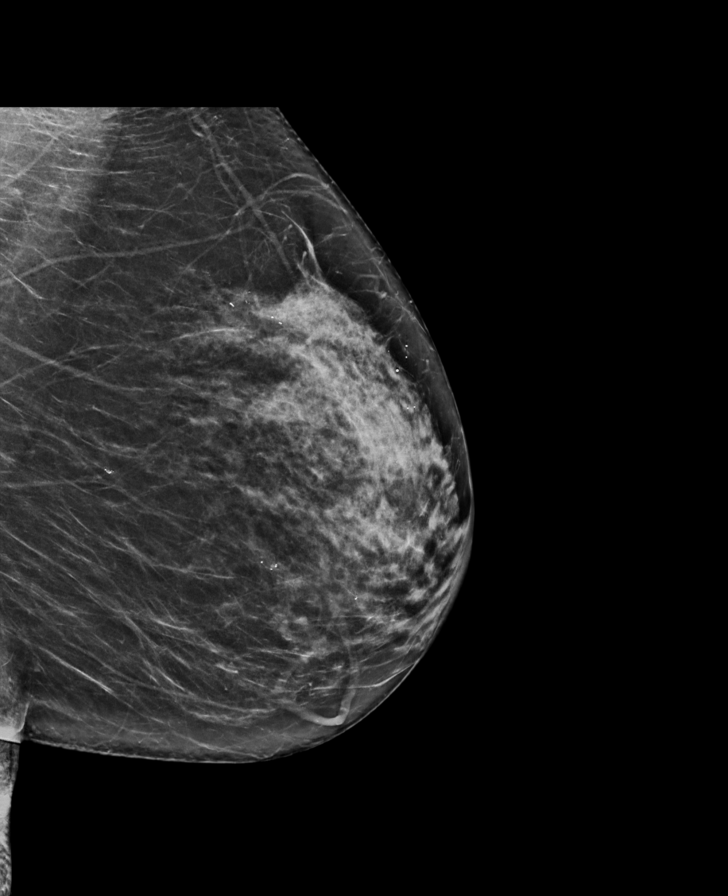

[R CC synth-2D]
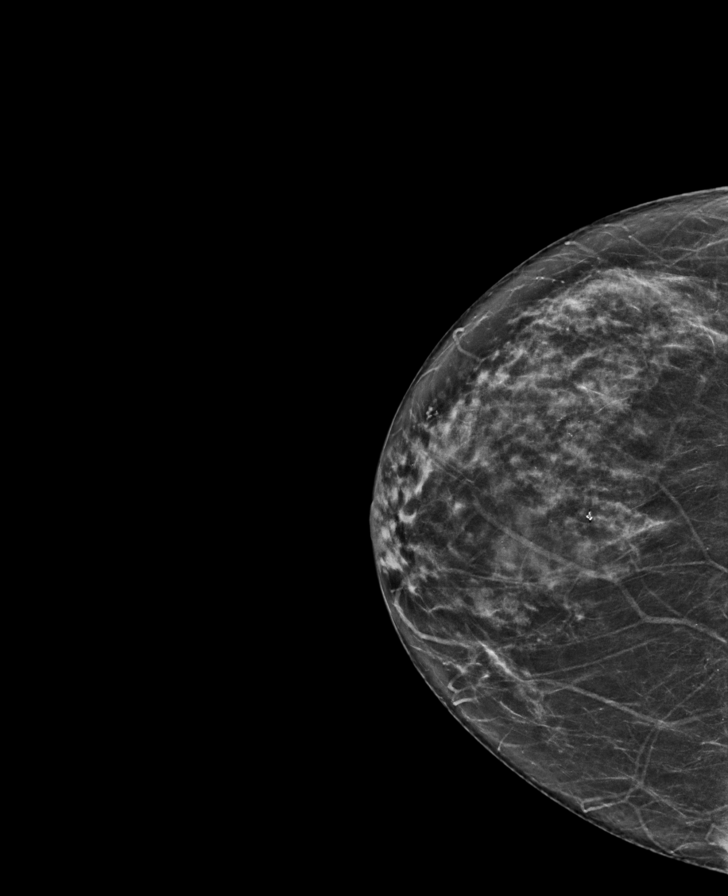

[R CC tomo · tomo slice 35/70.0]
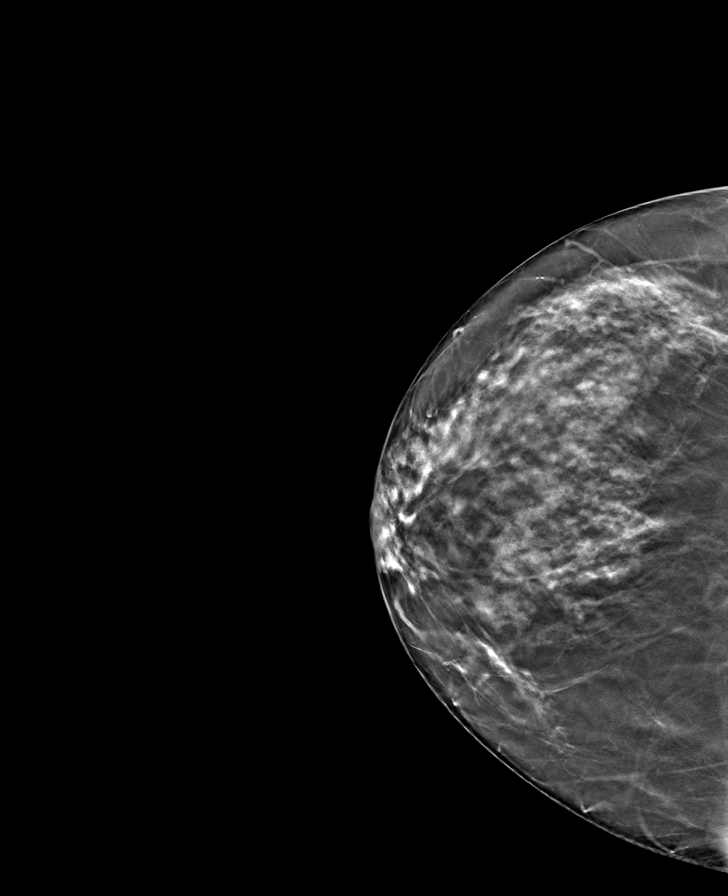

[L CC tomo · tomo slice 35/70.0]
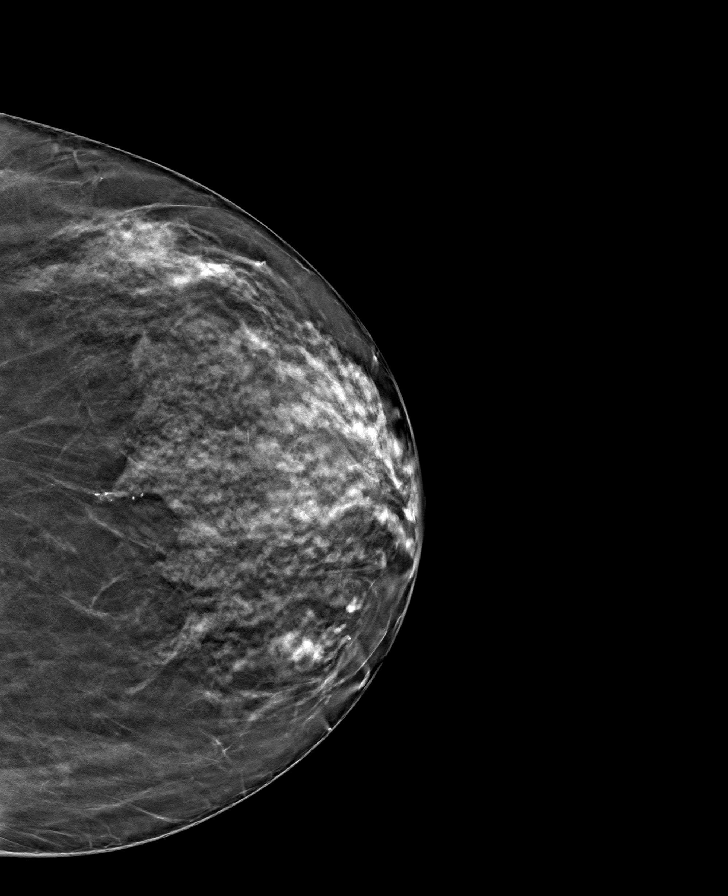

[R MLO tomo · tomo slice 41/81.0]
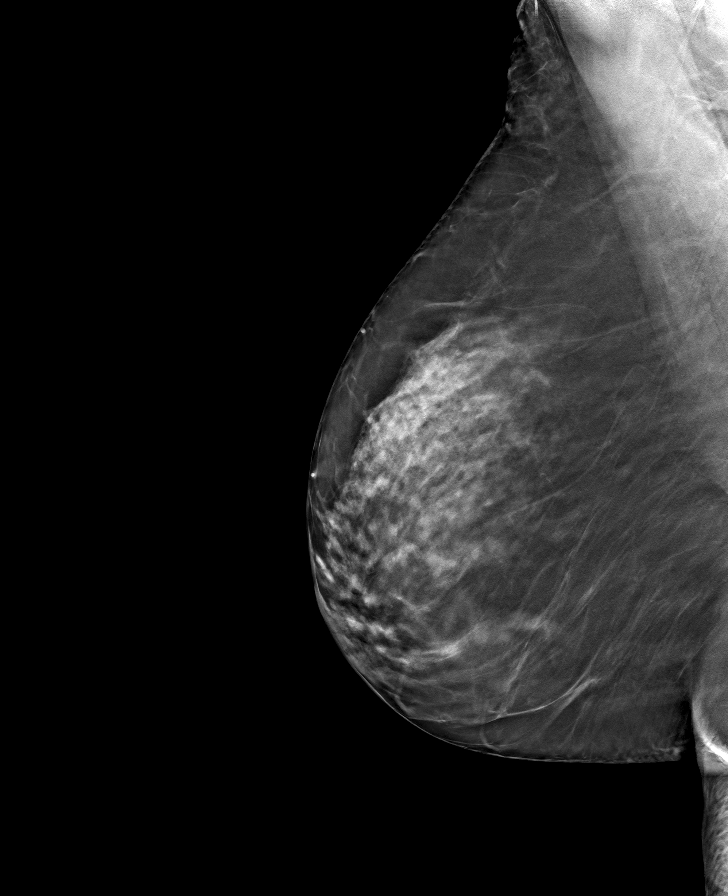

[L MLO tomo · tomo slice 38/75.0]
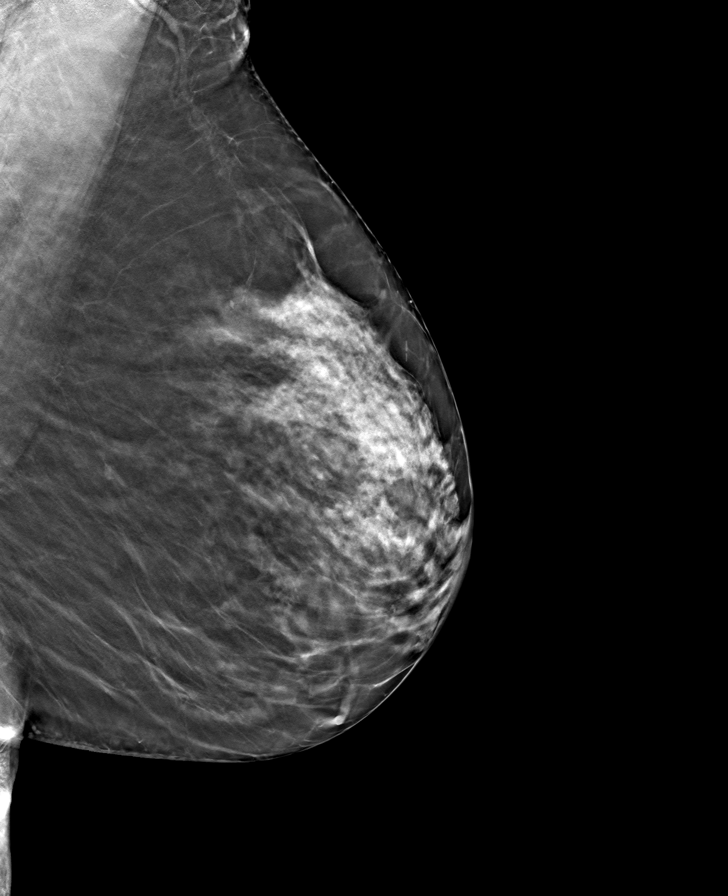

[8 of 24 positions shown; findings below may reference images not displayed]

ACR Breast Density Category c: The breast tissue is heterogeneously
dense, which may obscure small masses.
FINDINGS: In the right breast, calcifications warrant further evaluation with
magnified views. In the left breast, no findings suspicious for
malignancy.
IMPRESSION: Further evaluation is suggested for calcifications in the right
breast.

RECOMMENDATION:
Diagnostic mammogram of the right breast. (Code:00-G-001)

The patient will be contacted regarding the findings, and additional
imaging will be scheduled.

BI-RADS CATEGORY  0: Incomplete. Need additional imaging evaluation
and/or prior mammograms for comparison.

## 2022-03-19 ENCOUNTER — Other Ambulatory Visit (HOSPITAL_BASED_OUTPATIENT_CLINIC_OR_DEPARTMENT_OTHER): Payer: Self-pay | Admitting: Obstetrics & Gynecology

## 2022-03-19 DIAGNOSIS — Z79899 Other long term (current) drug therapy: Secondary | ICD-10-CM

## 2022-04-02 ENCOUNTER — Other Ambulatory Visit (HOSPITAL_BASED_OUTPATIENT_CLINIC_OR_DEPARTMENT_OTHER): Payer: Self-pay | Admitting: Obstetrics & Gynecology

## 2022-04-02 DIAGNOSIS — R232 Flushing: Secondary | ICD-10-CM

## 2022-04-10 DIAGNOSIS — K921 Melena: Secondary | ICD-10-CM | POA: Diagnosis not present

## 2022-04-10 DIAGNOSIS — K5900 Constipation, unspecified: Secondary | ICD-10-CM | POA: Diagnosis not present

## 2022-04-11 DIAGNOSIS — H401232 Low-tension glaucoma, bilateral, moderate stage: Secondary | ICD-10-CM | POA: Diagnosis not present

## 2022-04-30 ENCOUNTER — Other Ambulatory Visit (HOSPITAL_BASED_OUTPATIENT_CLINIC_OR_DEPARTMENT_OTHER): Payer: Self-pay | Admitting: Obstetrics & Gynecology

## 2022-04-30 DIAGNOSIS — R232 Flushing: Secondary | ICD-10-CM

## 2022-05-01 ENCOUNTER — Other Ambulatory Visit: Payer: Self-pay | Admitting: *Deleted

## 2022-05-01 MED ORDER — PRAVASTATIN SODIUM 20 MG PO TABS: ORAL_TABLET | ORAL | 0 refills | Status: DC

## 2022-05-02 DIAGNOSIS — Z1211 Encounter for screening for malignant neoplasm of colon: Secondary | ICD-10-CM | POA: Diagnosis not present

## 2022-05-02 DIAGNOSIS — K921 Melena: Secondary | ICD-10-CM | POA: Diagnosis not present

## 2022-05-05 DIAGNOSIS — R030 Elevated blood-pressure reading, without diagnosis of hypertension: Secondary | ICD-10-CM | POA: Diagnosis not present

## 2022-05-05 DIAGNOSIS — R0981 Nasal congestion: Secondary | ICD-10-CM | POA: Diagnosis not present

## 2022-05-05 DIAGNOSIS — R051 Acute cough: Secondary | ICD-10-CM | POA: Diagnosis not present

## 2022-05-15 DIAGNOSIS — L989 Disorder of the skin and subcutaneous tissue, unspecified: Secondary | ICD-10-CM | POA: Diagnosis not present

## 2022-05-15 DIAGNOSIS — L720 Epidermal cyst: Secondary | ICD-10-CM | POA: Diagnosis not present

## 2022-05-15 DIAGNOSIS — L723 Sebaceous cyst: Secondary | ICD-10-CM | POA: Diagnosis not present

## 2022-05-23 DIAGNOSIS — L669 Cicatricial alopecia, unspecified: Secondary | ICD-10-CM | POA: Diagnosis not present

## 2022-05-29 DIAGNOSIS — M48061 Spinal stenosis, lumbar region without neurogenic claudication: Secondary | ICD-10-CM | POA: Diagnosis not present

## 2022-05-29 DIAGNOSIS — E663 Overweight: Secondary | ICD-10-CM | POA: Diagnosis not present

## 2022-05-29 DIAGNOSIS — E785 Hyperlipidemia, unspecified: Secondary | ICD-10-CM | POA: Diagnosis not present

## 2022-05-29 DIAGNOSIS — I201 Angina pectoris with documented spasm: Secondary | ICD-10-CM | POA: Diagnosis not present

## 2022-05-29 DIAGNOSIS — R7301 Impaired fasting glucose: Secondary | ICD-10-CM | POA: Diagnosis not present

## 2022-06-14 ENCOUNTER — Other Ambulatory Visit (HOSPITAL_BASED_OUTPATIENT_CLINIC_OR_DEPARTMENT_OTHER): Payer: Medicare PPO

## 2022-06-14 DIAGNOSIS — Z79899 Other long term (current) drug therapy: Secondary | ICD-10-CM

## 2022-06-15 LAB — HEPATIC FUNCTION PANEL
ALT: 23 IU/L (ref 0–32)
AST: 34 IU/L (ref 0–40)
Albumin: 4.7 g/dL (ref 3.8–4.8)
Alkaline Phosphatase: 67 IU/L (ref 44–121)
Bilirubin Total: 0.3 mg/dL (ref 0.0–1.2)
Bilirubin, Direct: <0.1 mg/dL (ref 0.00–0.40)
Total Protein: 7.3 g/dL (ref 6.0–8.5)

## 2022-07-09 DIAGNOSIS — L649 Androgenic alopecia, unspecified: Secondary | ICD-10-CM | POA: Diagnosis not present

## 2022-07-09 DIAGNOSIS — L661 Lichen planopilaris: Secondary | ICD-10-CM | POA: Diagnosis not present

## 2022-07-09 DIAGNOSIS — L668 Other cicatricial alopecia: Secondary | ICD-10-CM | POA: Diagnosis not present

## 2022-07-11 ENCOUNTER — Telehealth: Payer: Self-pay | Admitting: Interventional Cardiology

## 2022-07-11 NOTE — Telephone Encounter (Signed)
Pt c/o medication issue:  1. Name of Medication: Minoxidil 1.2 mg 1x daily   2. How are you currently taking this medication (dosage and times per day)? Hasn't started   3. Are you having a reaction (difficulty breathing--STAT)?   4. What is your medication issue? Prescribed by dermatologist and would like to discuss this medication with cardiologist. Please advise.

## 2022-07-12 NOTE — Telephone Encounter (Signed)
Left a message for the pt to call back.   Not sure of she can wait until after her 08/02/22 appt but Minoxidil is a vasodilator so not sure it is okay for her to take... if she needs to start now can check with the PharmD. Unless she can use topically?

## 2022-07-16 ENCOUNTER — Encounter (HOSPITAL_BASED_OUTPATIENT_CLINIC_OR_DEPARTMENT_OTHER): Payer: Self-pay | Admitting: Obstetrics & Gynecology

## 2022-07-16 ENCOUNTER — Encounter: Payer: Self-pay | Admitting: Internal Medicine

## 2022-07-17 NOTE — Telephone Encounter (Signed)
See MyChart encounter

## 2022-07-25 ENCOUNTER — Telehealth (HOSPITAL_BASED_OUTPATIENT_CLINIC_OR_DEPARTMENT_OTHER): Payer: Medicare PPO | Admitting: Obstetrics & Gynecology

## 2022-07-25 DIAGNOSIS — F419 Anxiety disorder, unspecified: Secondary | ICD-10-CM

## 2022-07-25 DIAGNOSIS — R232 Flushing: Secondary | ICD-10-CM | POA: Diagnosis not present

## 2022-07-25 MED ORDER — DESVENLAFAXINE SUCCINATE ER 25 MG PO TB24: 25.0000 mg | ORAL_TABLET | Freq: Every day | ORAL | 1 refills | Status: DC

## 2022-07-25 NOTE — Progress Notes (Signed)
Virtual Visit via Video Note  I connected with Deborah Fisher on 07/25/22 at 11:35 AM EDT by a video enabled telemedicine application and verified that I am speaking with the correct person using two identifiers.  Location: Patient: home Provider: exam room at office   I discussed the limitations of evaluation and management by telemedicine and the availability of in person appointments. The patient expressed understanding and agreed to proceed.  History of Present Illness: 55 G0P0 SAA with h/o significant hot flashes who has finally had good results with Veozah.  She has basically weaned herself off the gabapentin.  She would like to stop   Observations/Objective:   Assessment and Plan:   Follow Up Instructions:    I discussed the assessment and treatment plan with the patient. The patient was provided an opportunity to ask questions and all were answered. The patient agreed with the plan and demonstrated an understanding of the instructions.   The patient was advised to call back or seek an in-person evaluation if the symptoms worsen or if the condition fails to improve as anticipated.  I provided *** minutes of non-face-to-face time during this encounter.   Jerene Bears, MD

## 2022-07-26 ENCOUNTER — Encounter (HOSPITAL_BASED_OUTPATIENT_CLINIC_OR_DEPARTMENT_OTHER): Payer: Self-pay | Admitting: Obstetrics & Gynecology

## 2022-08-01 NOTE — Progress Notes (Unsigned)
Cardiology Office Note:    Date:  08/01/2022   ID:  Deborah Fisher, DOB 1951/01/23, MRN [id number]  PCP:  Cleatis Polka., MD  Cardiologist:  Lesleigh Noe, MD (Inactive)   Referring MD: Cleatis Polka., MD   No chief complaint on file.   History of Present Illness:    Deborah Fisher is a 72 y.o. female with a hx of of non Hodgkin's lymphoma referred by Dr. Clelia Croft for evaluation of chest pain.  Prior cardiac evaluation by Dr. 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   He feels well.  She is exercising regularly.  Rare episodes of chest discomfort.  Occasional dizziness and dyspnea when walking up an incline.  She went for an 8 mile walk this morning.  Not really needing to use nitroglycerin to have any significant symptoms.  Asks about using Veozha    The pt was previously followed by Mendel Ryder   She was seen in May 2023  Past Medical History:  Diagnosis Date   Anginal pain (HCC)    Bursitis    left hip    Chronic constipation    DI (detrusor instability)    Femur fracture (HCC) 2011   Fibroid    Headache    Menopausal symptoms    Non-Hodgkin lymphoma (HCC) 1992   treated with CHOP   Osteoporosis    h/o 3 stress fractures   Tennis elbow Right    Trigger finger    bilateral     Past Surgical History:  Procedure Laterality Date   Biopsy for Non Hodgkins Lymphoma  1992   CARPAL TUNNEL RELEASE Right 09/11/2019   Procedure: CARPAL TUNNEL RELEASE;  Surgeon: Betha Loa, MD;  Location: [facility];  Service: Orthopedics;  Laterality: Right;   DILATION AND CURETTAGE OF UTERUS     PELVIC LAPAROSCOPY     TOE SURGERY     TRIGGER FINGER RELEASE Left 08/04/2018   Procedure: RELEASE TRIGGER FINGER/A-1 PULLEY;  Surgeon: Betha Loa, MD;  Location: [facility];  Service: Orthopedics;  Laterality: Left;    Current Medications: No outpatient medications have been marked as taking for the 08/02/22 encounter  (Appointment) with Pricilla Riffle, MD.     Allergies:   Iodinated contrast media   Social History   Socioeconomic History   Marital status: Single    Spouse name: Not on file   Number of children: 0   Years of education: PhD   Highest education level: Not on file  Occupational History    Employer: [employer]    Comment: Ass. Dean and Professor  Tobacco Use   Smoking status: Never   Smokeless tobacco: Never  Vaping Use   Vaping Use: Never used  Substance and Sexual Activity   Alcohol use: Yes    Comment: social   Drug use: No   Sexual activity: Not Currently    Partners: Male    Birth control/protection: Post-menopausal  Other Topics Concern   Not on file  Social History Narrative   Not on file   Social Determinants of Health   Financial Resource Strain: Not on file  Food Insecurity: Not on file  Transportation Needs: Not on file  Physical Activity: Not on file  Stress: Not on file  Social Connections: Not on file     Family History: The patient's family history includes Alcoholism in her father; Alzheimer's disease in her mother; Breast cancer in her  cousin and maternal aunt; Cirrhosis in her father; Colon cancer in her maternal uncle; Diabetes in her brother; Diabetes (age of onset: 14) in her mother; Glaucoma in her mother; Heart disease in her maternal aunt, maternal grandmother, and mother; Hypertension in her brother, father, and mother; Other in her paternal grandmother.  ROS:   Please see the history of present illness.    No episodes of syncope or other problems.  All other systems reviewed and are negative.  EKGs/Labs/Other Studies Reviewed:    The following studies were reviewed today:  2D Doppler echocardiogram 2020 Study Conclusions   - Left ventricle: The cavity size was normal. Systolic function was    normal. The estimated ejection fraction was in the range of 55%    to 60%. Wall motion was normal; there were no regional wall    motion  abnormalities. Left ventricular diastolic function    parameters were normal.  - Atrial septum: No defect or patent foramen ovale was identified.   Impressions:   - Normal GLS -18.3.   Coronary CTA 2020 MPRESSION: 1.  Coronary artery calcium score 0 Agatston units.   2.  No significant coronary disease noted  EKG:  EKG sinus rhythm, first-degree AV block at 274 ms, QS pattern V1 and V2.  And otherwise poor R wave progression.  When compared to June 06, 2020, the PR interval is marginally longer on today's tracing have an increase from 270 ms.  Recent Labs: 06/14/2022: ALT 23  Recent Lipid Panel    Component Value Date/Time   CHOL 221 (H) 12/19/2017 0000   TRIG 65 12/19/2017 0000   HDL 122 12/19/2017 0000   CHOLHDL 1.8 12/19/2017 0000   LDLCALC 86 12/19/2017 0000    Physical Exam:    VS:  LMP 11/04/1990     Wt Readings from Last 3 Encounters:  10/30/21 173 lb 12.8 oz (78.8 kg)  08/03/21 167 lb 6.4 oz (75.9 kg)  07/27/21 168 lb 3.2 oz (76.3 kg)     GEN: Healthy appearing, slightly overweight.. No acute distress HEENT: Normal NECK: No JVD. LYMPHATICS: No lymphadenopathy CARDIAC: No murmur. RRR no gallop, or edema. VASCULAR:  Normal Pulses. No bruits. RESPIRATORY:  Clear to auscultation without rales, wheezing or rhonchi  ABDOMEN: Soft, non-tender, non-distended, No pulsatile mass, MUSCULOSKELETAL: No deformity  SKIN: Warm and dry NEUROLOGIC:  Alert and oriented x 3 PSYCHIATRIC:  Normal affect   ASSESSMENT:    No diagnosis found.  PLAN:    In order of problems listed above:  Minimal if any symptoms. Discussed with the patient in terms of evidence of conduction system disease and anticipate his symptoms should not progress to high-grade AV block. Suboptimal LDL but coronary calcium score is 0.  Would not chase this.  Recommended consideration of calcium score in 2025 which will be 5 years post her initial study.  Overall education and awareness concerning  primary/secondary risk prevention was discussed in detail: LDL less than 70, hemoglobin A1c less than 7, blood pressure target less than 130/80 mmHg, >150 minutes of moderate aerobic activity per week, avoidance of smoking, weight control (via diet and exercise), and continued surveillance/management of/for obstructive sleep apnea.   Consider exercise treadmill test if she continues to experience dyspnea on exertion.   Medication Adjustments/Labs and Tests Ordered: Current medicines are reviewed at length with the patient today.  Concerns regarding medicines are outlined above.  No orders of the defined types were placed in this encounter.  No orders of the  defined types were placed in this encounter.   There are no Patient Instructions on file for this visit.   Signed, Dietrich Pates, MD  08/01/2022 4:45 PM    [facility]

## 2022-08-02 ENCOUNTER — Ambulatory Visit (INDEPENDENT_AMBULATORY_CARE_PROVIDER_SITE_OTHER): Payer: Medicare PPO

## 2022-08-02 ENCOUNTER — Ambulatory Visit: Payer: Medicare PPO | Attending: Internal Medicine | Admitting: Internal Medicine

## 2022-08-02 ENCOUNTER — Other Ambulatory Visit: Payer: Self-pay | Admitting: Internal Medicine

## 2022-08-02 ENCOUNTER — Encounter: Payer: Self-pay | Admitting: Internal Medicine

## 2022-08-02 VITALS — BP 138/82 | HR 73 | Ht 64.5 in | Wt 171.2 lb

## 2022-08-02 DIAGNOSIS — R42 Dizziness and giddiness: Secondary | ICD-10-CM

## 2022-08-02 DIAGNOSIS — I440 Atrioventricular block, first degree: Secondary | ICD-10-CM

## 2022-08-02 DIAGNOSIS — E785 Hyperlipidemia, unspecified: Secondary | ICD-10-CM | POA: Diagnosis not present

## 2022-08-02 NOTE — Progress Notes (Deleted)
Error

## 2022-08-02 NOTE — Progress Notes (Unsigned)
Enrolled for [employer] to mail a ZIO XT long term holter monitor to the patients address on file.  

## 2022-08-02 NOTE — Patient Instructions (Signed)
Medication Instructions:   *If you need a refill on your cardiac medications before your next appointment, please call your pharmacy*   Lab Work:  If you have labs (blood work) drawn today and your tests are completely normal, you will receive your results only by: MyChart Message (if you have MyChart) OR A paper copy in the mail If you have any lab test that is abnormal or we need to change your treatment, we will call you to review the results.   Testing/Procedures: Deborah Fisher- Long Term Monitor Instructions  Your physician has requested you wear a ZIO patch monitor for 7 days.  This is a single patch monitor. [employer] supplies one patch monitor per enrollment. Additional stickers are not available. Please do not apply patch if you will be having a Nuclear Stress Test,  Echocardiogram, Cardiac CT, MRI, or Chest Xray during the period you would be wearing the  monitor. The patch cannot be worn during these tests. You cannot remove and re-apply the  ZIO XT patch monitor.  Your ZIO patch monitor will be mailed 3 day USPS to your address on file. It may take 3-5 days  to receive your monitor after you have been enrolled.  Once you have received your monitor, please review the enclosed instructions. Your monitor  has already been registered assigning a specific monitor serial # to you.  Billing and Patient Assistance Program Information  We have supplied [employer] with any of your insurance information on file for billing purposes. [employer] offers a sliding scale Patient Assistance Program for patients that do not have  insurance, or whose insurance does not completely cover the cost of the ZIO monitor.  You must apply for the Patient Assistance Program to qualify for this discounted rate.  To apply, please call [employer] at [phone], select option 4, select option 2, ask to apply for  Patient Assistance Program. Deborah Fisher will ask your household income, and how many people  are in your  household. They will quote your out-of-pocket cost based on that information.  [employer] will also be able to set up a 43-month, interest-free payment plan if needed.  Applying the monitor   Shave hair from upper left chest.  Hold abrader disc by orange tab. Rub abrader in 40 strokes over the upper left chest as  indicated in your monitor instructions.  Clean area with 4 enclosed alcohol pads. Let dry.  Apply patch as indicated in monitor instructions. Patch will be placed under collarbone on left  side of chest with arrow pointing upward.  Rub patch adhesive wings for 2 minutes. Remove white label marked "1". Remove the white  label marked "2". Rub patch adhesive wings for 2 additional minutes.  While looking in a mirror, press and release button in center of patch. A small green light will  flash 3-4 times. This will be your only indicator that the monitor has been turned on.  Do not shower for the first 24 hours. You may shower after the first 24 hours.  Press the button if you feel a symptom. You will hear a small click. Record Date, Time and  Symptom in the Patient Logbook.  When you are ready to remove the patch, follow instructions on the last 2 pages of Patient  Logbook. Stick patch monitor onto the last page of Patient Logbook.  Place Patient Logbook in the blue and white box. Use locking tab on box and tape box closed  securely. The blue and white box has prepaid  postage on it. Please place it in the mailbox as  soon as possible. Your physician should have your test results approximately 7 days after the  monitor has been mailed back to [facility].  Call [facility] Customer Care at [phone] if you have questions regarding  your ZIO XT patch monitor. Call them immediately if you see an orange light blinking on your  monitor.  If your monitor falls off in less than 4 days, contact our Monitor department at [phone].  If your monitor becomes loose or falls off after  4 days call [employer] at [phone] for  suggestions on securing your monitor    Follow-Up: At [facility], you and your health needs are our priority.  As part of our continuing mission to provide you with exceptional heart care, we have created designated Provider Care Teams.  These Care Teams include your primary Cardiologist (physician) and Advanced Practice Providers (APPs -  Physician Assistants and Nurse Practitioners) who all work together to provide you with the care you need, when you need it.  We recommend signing up for the patient portal called "MyChart".  Sign up information is provided on this After Visit Summary.  MyChart is used to connect with patients for Virtual Visits (Telemedicine).  Patients are able to view lab/test results, encounter notes, upcoming appointments, etc.  Non-urgent messages can be sent to your provider as well.   To learn more about what you can do with MyChart, go to [email or web address].

## 2022-08-06 DIAGNOSIS — E785 Hyperlipidemia, unspecified: Secondary | ICD-10-CM | POA: Diagnosis not present

## 2022-08-06 DIAGNOSIS — I440 Atrioventricular block, first degree: Secondary | ICD-10-CM

## 2022-08-06 DIAGNOSIS — R42 Dizziness and giddiness: Secondary | ICD-10-CM | POA: Diagnosis not present

## 2022-08-14 ENCOUNTER — Other Ambulatory Visit: Payer: Self-pay | Admitting: Internal Medicine

## 2022-08-17 ENCOUNTER — Other Ambulatory Visit (HOSPITAL_BASED_OUTPATIENT_CLINIC_OR_DEPARTMENT_OTHER): Payer: Self-pay | Admitting: Obstetrics & Gynecology

## 2022-08-17 DIAGNOSIS — R232 Flushing: Secondary | ICD-10-CM

## 2022-08-21 ENCOUNTER — Encounter: Payer: Self-pay | Admitting: Internal Medicine

## 2022-08-21 DIAGNOSIS — R7301 Impaired fasting glucose: Secondary | ICD-10-CM | POA: Diagnosis not present

## 2022-08-21 DIAGNOSIS — M48061 Spinal stenosis, lumbar region without neurogenic claudication: Secondary | ICD-10-CM | POA: Diagnosis not present

## 2022-08-21 DIAGNOSIS — E663 Overweight: Secondary | ICD-10-CM | POA: Diagnosis not present

## 2022-08-21 DIAGNOSIS — I201 Angina pectoris with documented spasm: Secondary | ICD-10-CM | POA: Diagnosis not present

## 2022-08-21 DIAGNOSIS — E785 Hyperlipidemia, unspecified: Secondary | ICD-10-CM | POA: Diagnosis not present

## 2022-08-23 ENCOUNTER — Telehealth: Payer: Self-pay | Admitting: Internal Medicine

## 2022-08-23 DIAGNOSIS — I440 Atrioventricular block, first degree: Secondary | ICD-10-CM | POA: Diagnosis not present

## 2022-08-23 DIAGNOSIS — R42 Dizziness and giddiness: Secondary | ICD-10-CM | POA: Diagnosis not present

## 2022-08-23 DIAGNOSIS — I442 Atrioventricular block, complete: Secondary | ICD-10-CM

## 2022-08-23 NOTE — Telephone Encounter (Signed)
Deborah Constant, MD  to Me  Deborah Riffle, MD  [facility]   08/23/22 10:29 AM  High grade AB block that occurs both at night (longest pause) and during the day - Call back patient to see if they are symptomatic == if symptomatic (near syncope syncope) ED assessment is recommended - if only mildly symptomatic ASAP EP eval of discussion of PPM   Called patient back she has not had any near syncope or syncope. Patient stated she has had SOB with walking up steps in backyard twice daily while wearing monitor. Patient had no other symptoms to report at this time. Patient did state back in April she almost blacked out after a hike. Will place referral for EP.

## 2022-08-23 NOTE — Telephone Encounter (Signed)
Marissa with [employer] calling to report abnormal results

## 2022-08-23 NOTE — Telephone Encounter (Signed)
[employer] reporting on 08/07/22 patient had complete heart block 31 to 32 bpm for 7.6 seconds on patient's monitor that has been resulted to patient's chart. The monitor has been downloaded. Will send to DOD, Dr. Izora Ribas to review, since Dr. Tenny Craw is out today.

## 2022-08-24 ENCOUNTER — Encounter: Payer: Self-pay | Admitting: Internal Medicine

## 2022-08-24 NOTE — Telephone Encounter (Signed)
Pts OV with Dr. Ladona Ridgel EP was cancelled for 7/16 and moved to next Monday 6/24 at 1:30 pm, as requested by Dr. Leim Fabry.   Pt made aware of appt date and time, and agreed to this plan.

## 2022-08-24 NOTE — Telephone Encounter (Signed)
Reviewed with Rosette Reveal   He'll see on Monday   Please set up time

## 2022-08-24 NOTE — Telephone Encounter (Signed)
Left the pt a message to call the office back to discuss monitor results and this mychart message sent.  Pt is currently scheduled to see EP Dr. Ladona Ridgel on 7/16.   Pricilla Riffle, MD  P Cv Div Ch St Triage Episodes of 3rd degree AV block   Addressed by DOD earlier today Needs urgent referral to EP for PPM    If syncopal should go to ER

## 2022-08-27 ENCOUNTER — Ambulatory Visit: Payer: Medicare PPO | Attending: Internal Medicine | Admitting: Internal Medicine

## 2022-08-27 ENCOUNTER — Encounter: Payer: Self-pay | Admitting: Internal Medicine

## 2022-08-27 VITALS — BP 110/60 | HR 47 | Ht 64.0 in | Wt 169.6 lb

## 2022-08-27 DIAGNOSIS — I442 Atrioventricular block, complete: Secondary | ICD-10-CM

## 2022-08-27 NOTE — H&P (View-Only) (Signed)
HPI Deborah Fisher is referred by Dr. Tenny Craw for evaluation of heart block. She is a pleasant 72 yo woman with a h/o Deborah Fisher s/p chemo therapy with CHOP remotely, who had been followed by Dr. Katrinka Blazing. 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 She tells me that while hiking she and an episode of near syncope.  Allergies  Allergen Reactions   Iodinated Contrast Media Other (See Comments)     Warm feeling once     Current Outpatient Medications  Medication Sig Dispense Refill   acetaminophen (TYLENOL) 500 MG tablet Take 2 tablets (1,000 mg total) by mouth every 6 (six) hours as needed. 30 tablet 0   alendronate (FOSAMAX) 70 MG tablet Take 1 tablet by mouth once a week.     APPLE CIDER VINEGAR PO Take by mouth.     aspirin 81 MG EC tablet TAKE 1 TABLET BY MOUTH EVERY DAY 30 tablet 1   brimonidine (ALPHAGAN) 0.2 % ophthalmic solution 1 drop 2 (two) times daily.     Calcium Carbonate-Vitamin D (CALCIUM + D PO) Take 1 tablet by mouth 2 (two) times daily.      Cetirizine HCl (ZYRTEC ALLERGY PO) Take 1 tablet by mouth daily as needed.      Cholecalciferol (VITAMIN D PO) Take 1 tablet by mouth 2 (two) times daily.      CINNAMON PO Take by mouth.     Cyanocobalamin (VITAMIN B 12 PO) Take 1 tablet by mouth daily.      desvenlafaxine (PRISTIQ) 25 MG 24 hr tablet TAKE 1 TABLET (25 MG TOTAL) BY MOUTH DAILY. 90 tablet 0   Fezolinetant (VEOZAH) 45 MG TABS Take 1 tablet by mouth daily.     finasteride (PROSCAR) 5 MG tablet Take 2.5 mg by mouth daily.     gabapentin (NEURONTIN) 300 MG capsule TAKE 1 PILL IN THE MORNING (300 MG). TAKE 2 PILLS IN THE EVENING (600 MG). 90 capsule 11   Gabapentin, Once-Daily, 300 MG TABS Take 1 tablet by mouth 3 (three) times daily. EVERY OTHER DAY     ibuprofen (ADVIL) 600 MG tablet Take 1 tablet (600 mg total) by  mouth every 6 (six) hours as needed. 30 tablet 0   latanoprost (XALATAN) 0.005 % ophthalmic solution Place 1 drop into both eyes at bedtime.     Magnesium 200 MG TABS Take 1 tablet by mouth daily.     minoxidil (LONITEN) 2.5 MG tablet Take 1.25 mg by mouth daily.     Multiple Vitamin (MULTIVITAMIN) tablet Take 1 tablet by mouth daily.     pravastatin (PRAVACHOL) 20 MG tablet TAKE 1 TABLET BY MOUTH EVERY DAY IN THE EVENING 90 tablet 3   rizatriptan (MAXALT-MLT) 10 MG disintegrating tablet Take 10 mg by mouth as needed.      tacrolimus (PROTOPIC) 0.1 % ointment Apply 1 Application topically. 5 DAYS ON 2 DAYS OFF     Turmeric (QC TUMERIC COMPLEX PO) Take by mouth.     WEGOVY 1 MG/0.5ML SOAJ Inject 1 mg into the skin once a week.     nitroGLYCERIN (NITROSTAT) 0.4 MG SL tablet Place 1 tablet (0.4 mg total) under the tongue every 5 (five) minutes as needed for chest pain. 25 tablet 3   No current facility-administered medications for this visit.  Past Medical History:  Diagnosis Date   Anginal pain (HCC)    Bursitis    left hip    Chronic constipation    DI (detrusor instability)    Femur fracture (HCC) 2011   Fibroid    Headache    Menopausal symptoms    Non-Hodgkin lymphoma (HCC) 1992   treated with CHOP   Osteoporosis    h/o 3 stress fractures   Tennis elbow Right    Trigger finger    bilateral     ROS:   All systems reviewed and negative except as noted in the HPI.   Past Surgical History:  Procedure Laterality Date   Biopsy for Non Hodgkins Lymphoma  1992   CARPAL TUNNEL RELEASE Right 09/11/2019   Procedure: CARPAL TUNNEL RELEASE;  Surgeon: Betha Loa, MD;  Location: [facility];  Service: Orthopedics;  Laterality: Right;   DILATION AND CURETTAGE OF UTERUS     PELVIC LAPAROSCOPY     TOE SURGERY     TRIGGER FINGER RELEASE Left 08/04/2018   Procedure: RELEASE TRIGGER FINGER/A-1 PULLEY;  Surgeon: Betha Loa, MD;  Location: [facility];   Service: Orthopedics;  Laterality: Left;     Family History  Problem Relation Age of Onset   Diabetes Mother 105   Hypertension Mother    Alzheimer's disease Mother    Glaucoma Mother    Heart disease Mother    Hypertension Father    Alcoholism Father        deceased age 103   Cirrhosis Father    Diabetes Brother    Hypertension Brother    Colon cancer Maternal Uncle    Heart disease Maternal Grandmother    Breast cancer Cousin        Mat. 1st cousin-Age 50   Breast cancer Maternal Aunt        Age 48's   Heart disease Maternal Aunt    Other Paternal Grandmother        MI     Social History   Socioeconomic History   Marital status: Single    Spouse name: Not on file   Number of children: 0   Years of education: PhD   Highest education level: Not on file  Occupational History    Employer: [employer]    Comment: Ass. Dean and Professor  Tobacco Use   Smoking status: Never   Smokeless tobacco: Never  Vaping Use   Vaping Use: Never used  Substance and Sexual Activity   Alcohol use: Yes    Comment: social   Drug use: No   Sexual activity: Not Currently    Partners: Male    Birth control/protection: Post-menopausal  Other Topics Concern   Not on file  Social History Narrative   Not on file   Social Determinants of Health   Financial Resource Strain: Not on file  Food Insecurity: Not on file  Transportation Needs: Not on file  Physical Activity: Not on file  Stress: Not on file  Social Connections: Not on file  Intimate Partner Violence: Not on file     BP 110/60   Pulse (!) 47   Ht 5\' 4"  (1.626 m)   Wt 169 lb 9.6 oz (76.9 kg)   LMP 11/04/1990   SpO2 98%   BMI 29.11 kg/m   Physical Exam:  Well appearing NAD HEENT: Unremarkable Neck:  No JVD, no thyromegally Lymphatics:  No adenopathy Back:  No CVA tenderness Lungs:  Clear HEART:  Regular rate rhythm, no murmurs, no rubs, no clicks Abd:  soft, positive bowel sounds, no organomegally, no rebound,  no guarding Ext:  2 plus pulses, no edema, no cyanosis, no clubbing Skin:  No rashes no nodules Neuro:  CN II through XII intact, motor grossly intact  EKG - NSR with 2:1 AVB and AVWB    Assess/Plan: Intermittent CHB - she has developed symptoms and is not taking any AV nodal blocking drugs. I have discussed the indications /risks/benefits/goals/expectations of PPM insertion and she wishes to proceed. HTN - her bp is well controlled. No change in meds. 3. 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 Dyspnea - I suspect that this is at least in part related to her heart block and AV dissociation. We will see how she does after pacing.  Sharlot Gowda Antanisha Mohs,MD

## 2022-08-27 NOTE — Patient Instructions (Signed)
Medication Instructions:  Your physician recommends that you continue on your current medications as directed. Please refer to the Current Medication list given to you today. *If you need a refill on your cardiac medications before your next appointment, please call your pharmacy*   Lab Work: CBC and BMET Today If you have labs (blood work) drawn today and your tests are completely normal, you will receive your results only by: MyChart Message (if you have MyChart) OR A paper copy in the mail If you have any lab test that is abnormal or we need to change your treatment, we will call you to review the results.   Testing/Procedures: Pacemaker Implant - July 1st  Your physician has recommended that you have a pacemaker inserted. A pacemaker is a small device that is placed under the skin of your chest or abdomen to help control abnormal heart rhythms. This device uses electrical pulses to prompt the heart to beat at a normal rate. Pacemakers are used to treat heart rhythms that are too slow. Wire (leads) are attached to the pacemaker that goes into the chambers of you heart. This is done in the hospital and usually requires and overnight stay. Please see the instruction sheet given to you today for more information.   Follow-Up: At [facility], you and your health needs are our priority.  As part of our continuing mission to provide you with exceptional heart care, we have created designated Provider Care Teams.  These Care Teams include your primary Cardiologist (physician) and Advanced Practice Providers (APPs -  Physician Assistants and Nurse Practitioners) who all work together to provide you with the care you need, when you need it.  We recommend signing up for the patient portal called "MyChart".  Sign up information is provided on this After Visit Summary.  MyChart is used to connect with patients for Virtual Visits (Telemedicine).  Patients are able to view lab/test results,  encounter notes, upcoming appointments, etc.  Non-urgent messages can be sent to your provider as well.   To learn more about what you can do with MyChart, go to [email or web address].    Your next appointment:   We will schedule your follow up   Provider:   Lewayne Bunting, MD

## 2022-08-27 NOTE — Progress Notes (Signed)
    HPI Deborah Fisher is referred by Deborah Fisher for evaluation of heart block. She is a pleasant 71 yo woman with a h/o Deborah Fisher s/p chemo therapy with Deborah Fisher remotely, who had been followed by Deborah Fisher. 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 She tells me that while hiking she and an episode of near syncope.  Allergies  Allergen Reactions   Iodinated Contrast Media Other (See Comments)     Warm feeling once     Current Outpatient Medications  Medication Sig Dispense Refill   acetaminophen (TYLENOL) 500 MG tablet Take 2 tablets (1,000 mg total) by mouth every 6 (six) hours as needed. 30 tablet 0   alendronate (FOSAMAX) 70 MG tablet Take 1 tablet by mouth once a week.     APPLE CIDER VINEGAR PO Take by mouth.     aspirin 81 MG EC tablet TAKE 1 TABLET BY MOUTH EVERY DAY 30 tablet 1   brimonidine (ALPHAGAN) 0.2 % ophthalmic solution 1 drop 2 (two) times daily.     Calcium Carbonate-Vitamin D (CALCIUM + D PO) Take 1 tablet by mouth 2 (two) times daily.      Cetirizine HCl (ZYRTEC ALLERGY PO) Take 1 tablet by mouth daily as needed.      Cholecalciferol (VITAMIN D PO) Take 1 tablet by mouth 2 (two) times daily.      CINNAMON PO Take by mouth.     Cyanocobalamin (VITAMIN B 12 PO) Take 1 tablet by mouth daily.      desvenlafaxine (PRISTIQ) 25 MG 24 hr tablet TAKE 1 TABLET (25 MG TOTAL) BY MOUTH DAILY. 90 tablet 0   Fezolinetant (VEOZAH) 45 MG TABS Take 1 tablet by mouth daily.     finasteride (PROSCAR) 5 MG tablet Take 2.5 mg by mouth daily.     gabapentin (NEURONTIN) 300 MG capsule TAKE 1 PILL IN THE MORNING (300 MG). TAKE 2 PILLS IN THE EVENING (600 MG). 90 capsule 11   Gabapentin, Once-Daily, 300 MG TABS Take 1 tablet by mouth 3 (three) times daily. EVERY OTHER DAY     ibuprofen (ADVIL) 600 MG tablet Take 1 tablet (600 mg total) by  mouth every 6 (six) hours as needed. 30 tablet 0   latanoprost (XALATAN) 0.005 % ophthalmic solution Place 1 drop into both eyes at bedtime.     Magnesium 200 MG TABS Take 1 tablet by mouth daily.     minoxidil (LONITEN) 2.5 MG tablet Take 1.25 mg by mouth daily.     Multiple Vitamin (MULTIVITAMIN) tablet Take 1 tablet by mouth daily.     pravastatin (PRAVACHOL) 20 MG tablet TAKE 1 TABLET BY MOUTH EVERY DAY IN THE EVENING 90 tablet 3   rizatriptan (MAXALT-MLT) 10 MG disintegrating tablet Take 10 mg by mouth as needed.      tacrolimus (PROTOPIC) 0.1 % ointment Apply 1 Application topically. 5 DAYS ON 2 DAYS OFF     Turmeric (QC TUMERIC COMPLEX PO) Take by mouth.     WEGOVY 1 MG/0.5ML SOAJ Inject 1 mg into the skin once a week.     nitroGLYCERIN (NITROSTAT) 0.4 MG SL tablet Place 1 tablet (0.4 mg total) under the tongue every 5 (five) minutes as needed for chest pain. 25 tablet 3   No current facility-administered medications for this visit.       Past Medical History:  Diagnosis Date   Anginal pain (HCC)    Bursitis    left hip    Chronic constipation    DI (detrusor instability)    Femur fracture (HCC) 2011   Fibroid    Headache    Menopausal symptoms    Non-Hodgkin lymphoma (HCC) 1992   treated with Deborah Fisher   Osteoporosis    h/o 3 stress fractures   Tennis elbow Right    Trigger finger    bilateral     ROS:   All systems reviewed and negative except as noted in the HPI.   Past Surgical History:  Procedure Laterality Date   Biopsy for Non Hodgkins Lymphoma  1992   CARPAL TUNNEL RELEASE Right 09/11/2019   Procedure: CARPAL TUNNEL RELEASE;  Surgeon: Deborah Fisher, Kevin, MD;  Location: [facility];  Service: Orthopedics;  Laterality: Right;   DILATION AND CURETTAGE OF UTERUS     PELVIC LAPAROSCOPY     TOE SURGERY     TRIGGER FINGER RELEASE Left 08/04/2018   Procedure: RELEASE TRIGGER FINGER/A-1 PULLEY;  Surgeon: Deborah Fisher, Kevin, MD;  Location: [facility];   Service: Orthopedics;  Laterality: Left;     Family History  Problem Relation Age of Onset   Diabetes Mother 86   Hypertension Mother    Alzheimer's disease Mother    Glaucoma Mother    Heart disease Mother    Hypertension Father    Alcoholism Father        deceased age 63   Cirrhosis Father    Diabetes Brother    Hypertension Brother    Colon cancer Maternal Uncle    Heart disease Maternal Grandmother    Breast cancer Cousin        Mat. 1st cousin-Age 40   Breast cancer Maternal Aunt        Age 80's   Heart disease Maternal Aunt    Other Paternal Grandmother        MI     Social History   Socioeconomic History   Marital status: Single    Spouse name: Not on file   Number of children: 0   Years of education: PhD   Highest education level: Not on file  Occupational History    Employer: [employer]    Comment: Ass. Dean and Professor  Tobacco Use   Smoking status: Never   Smokeless tobacco: Never  Vaping Use   Vaping Use: Never used  Substance and Sexual Activity   Alcohol use: Yes    Comment: social   Drug use: No   Sexual activity: Not Currently    Partners: Male    Birth control/protection: Post-menopausal  Other Topics Concern   Not on file  Social History Narrative   Not on file   Social Determinants of Health   Financial Resource Strain: Not on file  Food Insecurity: Not on file  Transportation Needs: Not on file  Physical Activity: Not on file  Stress: Not on file  Social Connections: Not on file  Intimate Partner Violence: Not on file     BP 110/60   Pulse (!) 47   Ht 5' 4" (1.626 m)   Wt 169 lb 9.6 oz (76.9 kg)   LMP 11/04/1990   SpO2 98%   BMI 29.11 kg/m   Physical Exam:  Well appearing NAD HEENT: Unremarkable Neck:  No JVD, no thyromegally Lymphatics:  No adenopathy Back:  No CVA tenderness Lungs:  Clear HEART:    Regular rate rhythm, no murmurs, no rubs, no clicks Abd:  soft, positive bowel sounds, no organomegally, no rebound,  no guarding Ext:  2 plus pulses, no edema, no cyanosis, no clubbing Skin:  No rashes no nodules Neuro:  CN II through XII intact, motor grossly intact  EKG - NSR with 2:1 AVB and AVWB    Assess/Plan: Intermittent CHB - she has developed symptoms and is not taking any AV nodal blocking drugs. I have discussed the indications /risks/benefits/goals/expectations of PPM insertion and she wishes to proceed. HTN - her bp is well controlled. No change in meds. 3. 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 Dyspnea - I suspect that this is at least in part related to her heart block and AV dissociation. We will see how she does after pacing.  Inioluwa Boulay,MD 

## 2022-08-28 ENCOUNTER — Telehealth: Payer: Self-pay | Admitting: Internal Medicine

## 2022-08-28 LAB — BASIC METABOLIC PANEL
BUN/Creatinine Ratio: 15 (ref 12–28)
BUN: 12 mg/dL (ref 8–27)
CO2: 23 mmol/L (ref 20–29)
Calcium: 10.1 mg/dL (ref 8.7–10.3)
Chloride: 100 mmol/L (ref 96–106)
Creatinine, Ser: 0.79 mg/dL (ref 0.57–1.00)
Glucose: 74 mg/dL (ref 70–99)
Potassium: 5 mmol/L (ref 3.5–5.2)
Sodium: 139 mmol/L (ref 134–144)
eGFR: 79 mL/min/{1.73_m2} (ref >59)

## 2022-08-28 LAB — CBC
Hematocrit: 40 % (ref 34.0–46.6)
Hemoglobin: 13.7 g/dL (ref 11.1–15.9)
MCH: 32.5 pg (ref 26.6–33.0)
MCHC: 34.3 g/dL (ref 31.5–35.7)
MCV: 95 fL (ref 79–97)
Platelets: 171 10*3/uL (ref 150–450)
RBC: 4.21 x10E6/uL (ref 3.77–5.28)
RDW: 12.6 % (ref 11.7–15.4)
WBC: 4.4 10*3/uL (ref 3.4–10.8)

## 2022-08-28 NOTE — Telephone Encounter (Signed)
Spoke with the patient and advised that she will be lightly sedated for her pacemaker implant. She will not be put under general anesthesia. Patient verbalized understanding.

## 2022-08-28 NOTE — Telephone Encounter (Signed)
Patient stated she has changed her mind regarding her upcoming procedure and would like to be sedated.  Patient wants call back to confirm.

## 2022-08-29 ENCOUNTER — Telehealth: Payer: Self-pay | Admitting: Internal Medicine

## 2022-08-29 NOTE — Telephone Encounter (Signed)
Patient was told that she would be spending the night after her pacemaker implant on 7/1 since she does not have anyone to stay with her overnight. Confirmed that she will be staying. She also would like for [place] to know that she is allergic to adhesive tape and it causes a bad rash. This has been added to her allergy list.

## 2022-08-29 NOTE — Telephone Encounter (Signed)
Pt is requesting a callback regarding some questions she has for the nurse about her upcoming procedure on 09/03/2022. Please advise

## 2022-08-31 NOTE — Pre-Procedure Instructions (Signed)
Instructed patient on the following items: Arrival time 0930 Nothing to eat or drink after midnight No meds AM of procedure Pt doesn't have anyone to stay overnight with them, will plan to admitt post procedure. Wash with special soap night before and morning of procedure  Pt last Wegovy dose was 6/20.

## 2022-09-03 ENCOUNTER — Other Ambulatory Visit: Payer: Self-pay

## 2022-09-03 ENCOUNTER — Ambulatory Visit (HOSPITAL_COMMUNITY)
Admission: RE | Disposition: A | Discharge status: Home / Self Care | Payer: Self-pay | Source: Home / Self Care | Attending: Internal Medicine

## 2022-09-03 ENCOUNTER — Observation Stay (HOSPITAL_COMMUNITY)
Admission: RE | Admit: 2022-09-03 | Discharge: 2022-09-04 | Disposition: A | Discharge status: Home / Self Care | Payer: Medicare PPO | Attending: Internal Medicine | Admitting: Internal Medicine

## 2022-09-03 DIAGNOSIS — I442 Atrioventricular block, complete: Principal | ICD-10-CM | POA: Insufficient documentation

## 2022-09-03 DIAGNOSIS — Z79899 Other long term (current) drug therapy: Secondary | ICD-10-CM | POA: Diagnosis not present

## 2022-09-03 DIAGNOSIS — I2081 Angina pectoris with coronary microvascular dysfunction: Secondary | ICD-10-CM | POA: Insufficient documentation

## 2022-09-03 DIAGNOSIS — Z7982 Long term (current) use of aspirin: Secondary | ICD-10-CM | POA: Diagnosis not present

## 2022-09-03 HISTORY — PX: PACEMAKER IMPLANT: EP1218

## 2022-09-03 SURGERY — PACEMAKER IMPLANT

## 2022-09-03 MED ORDER — HEPARIN (PORCINE) IN NACL 1000-0.9 UT/500ML-% IV SOLN
INTRAVENOUS | Status: DC | PRN
  Administered 2022-09-03: 500 mL

## 2022-09-03 MED ORDER — MIDAZOLAM HCL 2 MG/2ML IJ SOLN
INTRAMUSCULAR | Status: AC
  Filled 2022-09-03: qty 2

## 2022-09-03 MED ORDER — SODIUM CHLORIDE 0.9 % IV SOLN: INTRAVENOUS | Status: DC

## 2022-09-03 MED ORDER — LIDOCAINE HCL (PF) 1 % IJ SOLN
INTRAMUSCULAR | Status: DC | PRN
  Administered 2022-09-03: 60 mL

## 2022-09-03 MED ORDER — CEFAZOLIN SODIUM-DEXTROSE 2-4 GM/100ML-% IV SOLN
INTRAVENOUS | Status: AC
  Filled 2022-09-03: qty 100

## 2022-09-03 MED ORDER — POVIDONE-IODINE 10 % EX SWAB
2.0000 | Freq: Once | CUTANEOUS | Status: AC
  Administered 2022-09-03: 2 via TOPICAL

## 2022-09-03 MED ORDER — SODIUM CHLORIDE 0.9 % IV SOLN
INTRAVENOUS | Status: AC
  Filled 2022-09-03: qty 2

## 2022-09-03 MED ORDER — ACETAMINOPHEN 500 MG PO TABS
1000.0000 mg | ORAL_TABLET | Freq: Four times a day (QID) | ORAL | Status: DC | PRN
  Administered 2022-09-03: 1000 mg via ORAL
  Filled 2022-09-03: qty 2

## 2022-09-03 MED ORDER — ACETAMINOPHEN 325 MG PO TABS: 325.0000 mg | ORAL_TABLET | ORAL | Status: DC | PRN

## 2022-09-03 MED ORDER — CEFAZOLIN SODIUM-DEXTROSE 1-4 GM/50ML-% IV SOLN
1.0000 g | Freq: Four times a day (QID) | INTRAVENOUS | Status: AC
  Administered 2022-09-03 – 2022-09-04 (×3): 1 g via INTRAVENOUS
  Filled 2022-09-03 (×3): qty 50

## 2022-09-03 MED ORDER — FENTANYL CITRATE (PF) 100 MCG/2ML IJ SOLN
INTRAMUSCULAR | Status: AC
  Filled 2022-09-03: qty 2

## 2022-09-03 MED ORDER — MIDAZOLAM HCL 5 MG/5ML IJ SOLN
INTRAMUSCULAR | Status: DC | PRN
  Administered 2022-09-03 (×4): 1 mg via INTRAVENOUS

## 2022-09-03 MED ORDER — FENTANYL CITRATE (PF) 100 MCG/2ML IJ SOLN
INTRAMUSCULAR | Status: DC | PRN
  Administered 2022-09-03 (×4): 12.5 ug via INTRAVENOUS

## 2022-09-03 MED ORDER — CEFAZOLIN SODIUM-DEXTROSE 2-4 GM/100ML-% IV SOLN
2.0000 g | INTRAVENOUS | Status: AC
  Administered 2022-09-03: 2 g via INTRAVENOUS

## 2022-09-03 MED ORDER — ONDANSETRON HCL 4 MG/2ML IJ SOLN: 4.0000 mg | Freq: Four times a day (QID) | INTRAMUSCULAR | Status: DC | PRN

## 2022-09-03 MED ORDER — SODIUM CHLORIDE 0.9 % IV SOLN
80.0000 mg | INTRAVENOUS | Status: AC
  Administered 2022-09-03: 80 mg

## 2022-09-03 SURGICAL SUPPLY — 13 items
CABLE SURGICAL S-101-97-12 (CABLE) ×1 IMPLANT
CATH RIGHTSITE C315HIS02 (CATHETERS) IMPLANT
IPG PACE AZUR XT DR MRI W1DR01 (Pacemaker) IMPLANT
KIT MICROPUNCTURE NIT STIFF (SHEATH) IMPLANT
LEAD CAPSURE NOVUS 5076-52CM (Lead) IMPLANT
LEAD SELECT SECURE 3830 383069 (Lead) IMPLANT
PACE AZURE XT DR MRI W1DR01 (Pacemaker) ×1 IMPLANT
PAD DEFIB RADIO PHYSIO CONN (PAD) ×1 IMPLANT
SELECT SECURE 3830 383069 (Lead) ×1 IMPLANT
SHEATH 7FR PRELUDE SNAP 13 (SHEATH) IMPLANT
SLITTER 6232ADJ (MISCELLANEOUS) IMPLANT
TRAY PACEMAKER INSERTION (PACKS) ×1 IMPLANT
WIRE HI TORQ VERSACORE-J 145CM (WIRE) IMPLANT

## 2022-09-03 NOTE — Interval H&P Note (Signed)
History and Physical Interval Note:  09/03/2022 11:31 AM  Deborah Fisher  has presented today for surgery, with the diagnosis of heart block.  The various methods of treatment have been discussed with the patient and family. After consideration of risks, benefits and other options for treatment, the patient has consented to  Procedure(s): PACEMAKER IMPLANT (N/A) as a surgical intervention.  The patient's history has been reviewed, patient examined, no change in status, stable for surgery.  I have reviewed the patient's chart and labs.  Questions were answered to the patient's satisfaction.     Lewayne Bunting

## 2022-09-03 NOTE — Progress Notes (Signed)
Orthopedic Tech Progress Note Patient Details:  Deborah Fisher Sep 23, 1950 [id number]  RN stated patient has arm sling   Patient ID: Deborah Fisher, female   DOB: 1950/05/26, 72 y.o.   MRN: [id number]  [name] 09/03/2022, 2:25 PM

## 2022-09-03 NOTE — Discharge Instructions (Addendum)
After Your Pacemaker     ACTIVITY Do not lift your arm above shoulder height for 1 week after your procedure. After 7 days, you may progress as below.  You should remove your sling 24 hours after your procedure, unless otherwise instructed by your provider.     Monday September 10, 2022  Tuesday September 11, 2022 Wednesday September 12, 2022 Thursday September 13, 2022   Do not lift, push, pull, or carry anything over 10 pounds with the affected arm until 6 weeks (Monday October 15, 2022 ) after your procedure.   You may drive AFTER your wound check, unless you have been told otherwise by your provider.   Ask your healthcare provider when you can go back to work   INCISION/Dressing  If large square, outer bandage is left in place, this can be removed after 24 hours from your procedure. Do not remove steri-strips or glue as below.   If a PRESSURE DRESSING (a bulky dressing that usually goes up over your shoulder) was applied or left in place, please follow instructions given by your provider on when to return to have this removed.   Monitor your Pacemaker site for redness, swelling, and drainage. Call the device clinic at [phone] if you experience these symptoms or fever/chills.  If your incision is sealed with Steri-strips or staples, you may shower 7 days after your procedure or when told by your provider. Do not remove the steri-strips or let the shower hit directly on your site. You may wash around your site with soap and water.    If you were discharged in a sling, please do not wear this during the day more than 48 hours after your surgery unless otherwise instructed. This may increase the risk of stiffness and soreness in your shoulder.   Avoid lotions, ointments, or perfumes over your incision until it is well-healed.  You may use a hot tub or a pool AFTER your wound check appointment if the incision is completely closed.  Pacemaker Alerts:  Some alerts are vibratory and others beep. These  are NOT emergencies. Please call our office to let us know. If this occurs at night or on weekends, it can wait until the next business day. Send a remote transmission.  If your device is capable of reading fluid status (for heart failure), you will be offered monthly monitoring to review this with you.   DEVICE MANAGEMENT Remote monitoring is used to monitor your pacemaker from home. This monitoring is scheduled every 91 days by our office. It allows [place] to keep an eye on the functioning of your device to ensure it is working properly. You will routinely see your Electrophysiologist annually (more often if necessary).   You should receive your ID card for your new device in 4-8 weeks. Keep this card with you at all times once received. Consider wearing a medical alert bracelet or necklace.  Your Pacemaker may be MRI compatible. This will be discussed at your next office visit/wound check.  You should avoid contact with strong electric or magnetic fields.   Do not use amateur (ham) radio equipment or electric (arc) welding torches. MP3 player headphones with magnets should not be used. Some devices are safe to use if held at least 12 inches (30 cm) from your Pacemaker. These include power tools, lawn mowers, and speakers. If you are unsure if something is safe to use, ask your health care provider.  When using your cell phone, hold it to the ear that  is on the opposite side from the Pacemaker. Do not leave your cell phone in a pocket over the Pacemaker.  You may safely use electric blankets, heating pads, computers, and microwave ovens.  Call the office right away if: You have chest pain. You feel more short of breath than you have felt before. You feel more light-headed than you have felt before. Your incision starts to open up.  This information is not intended to replace advice given to you by your health care provider. Make sure you discuss any questions you have with your health care  provider.

## 2022-09-04 ENCOUNTER — Encounter (HOSPITAL_COMMUNITY): Payer: Self-pay | Admitting: Internal Medicine

## 2022-09-04 ENCOUNTER — Observation Stay (HOSPITAL_COMMUNITY): Payer: Medicare PPO

## 2022-09-04 DIAGNOSIS — I2081 Angina pectoris with coronary microvascular dysfunction: Secondary | ICD-10-CM | POA: Diagnosis not present

## 2022-09-04 DIAGNOSIS — Z79899 Other long term (current) drug therapy: Secondary | ICD-10-CM | POA: Diagnosis not present

## 2022-09-04 DIAGNOSIS — Z950 Presence of cardiac pacemaker: Secondary | ICD-10-CM | POA: Diagnosis not present

## 2022-09-04 DIAGNOSIS — Z7982 Long term (current) use of aspirin: Secondary | ICD-10-CM | POA: Diagnosis not present

## 2022-09-04 DIAGNOSIS — I442 Atrioventricular block, complete: Secondary | ICD-10-CM | POA: Diagnosis not present

## 2022-09-04 NOTE — Discharge Summary (Addendum)
ELECTROPHYSIOLOGY PROCEDURE DISCHARGE SUMMARY    Patient ID: Deborah Fisher,  MRN: [id number], DOB/AGE: [birth year] 72 y.o.  Admit date: 09/03/2022 Discharge date: 09/04/2022  Primary Care Physician: Cleatis Polka., MD  Primary Cardiologist: Dr. Tenny Craw Electrophysiologist: Dr. Ladona Ridgel  Primary Discharge Diagnosis:  CHB  Secondary Discharge Diagnosis:  Non-Hodgkins Remote Hx of CHOP  Allergies  Allergen Reactions   Iodinated Contrast Media Other (See Comments)     Warm feeling once   Tape Rash     Procedures This Admission:  1.  Implantation of a MDT dual chamber PPM on 7!24 by Dr Ladona Ridgel.   There were no immediate post procedure complications. CXR on 09/04/22 demonstrated no pneumothorax status post device implantation.   Brief HPI: Deborah Fisher is a 72 y.o. female was referred to electrophysiology in the outpatient setting for consideration of PPM implantation.  Past medical history includes above.  The patient developed episodic CHB without reversible causes identified.  Risks, benefits, and alternatives to PPM implantation were reviewed with the patient who wished to proceed.   Hospital Course:  The patient was admitted and underwent implantation of a PPM with details as outlined in the procedure report.  She was monitored on telemetry overnight which demonstrated SR.  Left chest was without hematoma or ecchymosis.  The device was interrogated and found to be functioning normally.  CXR was obtained and demonstrated no pneumothorax status post device implantation.  Wound care, arm mobility, and restrictions were reviewed with the patient.  The patient feels well, denies any CP/SOB, with  minimal site discomfort.  She was examined by Dr. Ladona Ridgel and considered stable for discharge to home.    Physical Exam: Vitals:   09/03/22 2011 09/04/22 0018 09/04/22 0441 09/04/22 0815  BP: (!) 141/74 (!) 149/79 126/74 (!) 148/77  Pulse: 67 67 67 60  Resp: 18 18 18 15   Temp:  98.1 F (36.7 C) 98.2 F (36.8 C) 98.1 F (36.7 C) 98.3 F (36.8 C)  TempSrc: Oral Oral Oral Oral  SpO2: 99% 97% 97% 100%  Weight:      Height:        GEN- The patient is well appearing, alert and oriented x 3 today.   HEENT: normocephalic, atraumatic; sclera clear, conjunctiva pink; hearing intact; oropharynx clear; neck supple, no JVP Lungs-  CTA b/l, normal work of breathing.  No wheezes, rales, rhonchi Heart- RRR, no murmurs, rubs or gallops, PMI not laterally displaced GI- soft, non-tender, non-distended Extremities- no clubbing, cyanosis, or edema MS- no significant deformity or atrophy Skin- warm and dry, no rash or lesion, left chest without hematoma/ecchymosis Psych- euthymic mood, full affect Neuro- no gross deficits   Labs:   Lab Results  Component Value Date   WBC 4.4 08/27/2022   HGB 13.7 08/27/2022   HCT 40.0 08/27/2022   MCV 95 08/27/2022   PLT 171 08/27/2022   No results for input(s): "NA", "K", "CL", "CO2", "BUN", "CREATININE", "CALCIUM", "PROT", "BILITOT", "ALKPHOS", "ALT", "AST", "GLUCOSE" in the last 168 hours.  Invalid input(s): "LABALBU"  Discharge Medications:  Allergies as of 09/04/2022       Reactions   Iodinated Contrast Media Other (See Comments)   Warm feeling once   Tape Rash        Medication List     TAKE these medications    acetaminophen 500 MG tablet Commonly known as: TYLENOL Take 2 tablets (1,000 mg total) by mouth every 6 (six) hours as needed. What  changed: reasons to take this   alendronate 70 MG tablet Commonly known as: FOSAMAX Take 1 tablet by mouth once a week.   aspirin EC 81 MG tablet TAKE 1 TABLET BY MOUTH EVERY DAY   brimonidine 0.2 % ophthalmic solution Commonly known as: ALPHAGAN Place 1 drop into both eyes 2 (two) times daily.   CALCIUM + D PO Take 1 tablet by mouth 2 (two) times daily.   cetirizine 10 MG tablet Commonly known as: ZYRTEC Take 10 mg by mouth every evening.   CINNAMON PO Take 1  tablet by mouth daily.   desvenlafaxine 25 MG 24 hr tablet Commonly known as: PRISTIQ TAKE 1 TABLET (25 MG TOTAL) BY MOUTH DAILY. What changed: when to take this   finasteride 5 MG tablet Commonly known as: PROSCAR Take 2.5 mg by mouth daily.   gabapentin 300 MG capsule Commonly known as: NEURONTIN TAKE 1 PILL IN THE MORNING (300 MG). TAKE 2 PILLS IN THE EVENING (600 MG). What changed: See the new instructions.   latanoprost 0.005 % ophthalmic solution Commonly known as: XALATAN Place 1 drop into both eyes at bedtime.   Magnesium 200 MG Tabs Take 200 mg by mouth at bedtime.   minoxidil 2.5 MG tablet Commonly known as: LONITEN Take 1.25 mg by mouth at bedtime.   multivitamin tablet Take 1 tablet by mouth daily.   nitroGLYCERIN 0.4 MG SL tablet Commonly known as: NITROSTAT Place 1 tablet (0.4 mg total) under the tongue every 5 (five) minutes as needed for chest pain.   pravastatin 20 MG tablet Commonly known as: PRAVACHOL TAKE 1 TABLET BY MOUTH EVERY DAY IN THE EVENING   rizatriptan 10 MG disintegrating tablet Commonly known as: MAXALT-MLT Take 10 mg by mouth as needed for migraine.   tacrolimus 0.1 % ointment Commonly known as: PROTOPIC Apply 1 Application topically See admin instructions. 1 application for 5 days on and 2 days off   Veozah 45 MG Tabs Generic drug: Fezolinetant Take 45 mg by mouth daily.   VITAMIN B 12 PO Take 1 tablet by mouth daily.   Vitamin D 50 MCG (2000 UT) Caps Take 2,000 Units by mouth in the morning and at bedtime.   Wegovy 1.7 MG/0.75ML Soaj Generic drug: Semaglutide-Weight Management Inject 1.7 mg into the skin every Thursday.        Disposition: Home Discharge Instructions     Diet - low sodium heart healthy   Complete by: As directed    Increase activity slowly   Complete by: As directed         Duration of Discharge Encounter: Greater than 30 minutes including physician time.  Deborah Fredrickson,  PA-C 09/04/2022 9:40 AM  EP Attending  Patient seen and examined. Agree with above. The patient is doing well after insertion of a DDD PM. She is doing well and interrogation of her device demonstrates normal DDD PM function. CXR demonstrates normal device function. She will be discharged home with usual followup.  Sharlot Gowda Chasitie Passey,MD

## 2022-09-06 ENCOUNTER — Encounter: Payer: Self-pay | Admitting: Internal Medicine

## 2022-09-07 ENCOUNTER — Encounter (HOSPITAL_BASED_OUTPATIENT_CLINIC_OR_DEPARTMENT_OTHER): Payer: Self-pay | Admitting: Obstetrics & Gynecology

## 2022-09-07 MED FILL — Midazolam HCl Inj 2 MG/2ML (Base Equivalent): INTRAMUSCULAR | Qty: 4 | Status: AC

## 2022-09-17 ENCOUNTER — Other Ambulatory Visit: Payer: Self-pay

## 2022-09-18 ENCOUNTER — Institutional Professional Consult (permissible substitution): Payer: Medicare PPO | Admitting: Internal Medicine

## 2022-09-18 NOTE — Patient Instructions (Signed)
   After Your Pacemaker   Monitor your pacemaker site for redness, swelling, and drainage. Call the device clinic at [phone] if you experience these symptoms or fever/chills.  Your incision was closed with Steri-strips or staples:  You may shower 7 days after your procedure and wash your incision with soap and water. Avoid lotions, ointments, or perfumes over your incision until it is well-healed.  You may use a hot tub or a pool after your wound check appointment if the incision is completely closed.  Do not lift, push or pull greater than 10 pounds with the affected arm until 6 weeks after your procedure. UNTIL AFTER AUGUST 12TH. There are no other restrictions in arm movement after your wound check appointment.  You may drive, unless driving has been restricted by your healthcare providers.  Remote monitoring is used to monitor your pacemaker from home. This monitoring is scheduled every 91 days by our office. It allows [place] to keep an eye on the functioning of your device to ensure it is working properly. You will routinely see your Electrophysiologist annually (more often if necessary).

## 2022-09-19 ENCOUNTER — Ambulatory Visit: Payer: Medicare PPO | Attending: Cardiology

## 2022-09-19 DIAGNOSIS — I442 Atrioventricular block, complete: Secondary | ICD-10-CM

## 2022-09-19 LAB — CUP PACEART INCLINIC DEVICE CHECK
Battery Remaining Longevity: 137 mo
Battery Voltage: 3.21 V
Brady Statistic AP VP Percent: 24.27 %
Brady Statistic AP VS Percent: 0 %
Brady Statistic AS VP Percent: 75.55 %
Brady Statistic AS VS Percent: 0.18 %
Brady Statistic RA Percent Paced: 24.03 %
Brady Statistic RV Percent Paced: 99.82 %
Date Time Interrogation Session: 20240717163310
Implantable Lead Connection Status: 753985
Implantable Lead Connection Status: 753985
Implantable Lead Implant Date: 20240701
Implantable Lead Implant Date: 20240701
Implantable Lead Location: 753859
Implantable Lead Location: 753860
Implantable Lead Model: 3830
Implantable Lead Model: 5076
Implantable Pulse Generator Implant Date: 20240701
Lead Channel Impedance Value: 361 Ohm
Lead Channel Impedance Value: 399 Ohm
Lead Channel Impedance Value: 551 Ohm
Lead Channel Impedance Value: 551 Ohm
Lead Channel Pacing Threshold Amplitude: 0.25 V
Lead Channel Pacing Threshold Amplitude: 0.25 V
Lead Channel Pacing Threshold Amplitude: 0.5 V
Lead Channel Pacing Threshold Pulse Width: 0.4 ms
Lead Channel Pacing Threshold Pulse Width: 0.4 ms
Lead Channel Pacing Threshold Pulse Width: 0.4 ms
Lead Channel Sensing Intrinsic Amplitude: 22 mV
Lead Channel Sensing Intrinsic Amplitude: 25 mV
Lead Channel Sensing Intrinsic Amplitude: 3.5 mV
Lead Channel Sensing Intrinsic Amplitude: 6.25 mV
Lead Channel Setting Pacing Amplitude: 3.5 V
Lead Channel Setting Pacing Amplitude: 3.5 V
Lead Channel Setting Pacing Pulse Width: 0.4 ms
Lead Channel Setting Sensing Sensitivity: 1.2 mV
Zone Setting Status: 755011

## 2022-09-19 NOTE — Progress Notes (Signed)

## 2022-10-04 ENCOUNTER — Encounter: Payer: Self-pay | Admitting: Internal Medicine

## 2022-10-05 ENCOUNTER — Other Ambulatory Visit (HOSPITAL_BASED_OUTPATIENT_CLINIC_OR_DEPARTMENT_OTHER): Payer: Self-pay | Admitting: Obstetrics & Gynecology

## 2022-10-05 ENCOUNTER — Encounter: Payer: Self-pay | Admitting: Internal Medicine

## 2022-10-05 DIAGNOSIS — F419 Anxiety disorder, unspecified: Secondary | ICD-10-CM

## 2022-10-05 MED ORDER — PRAVASTATIN SODIUM 20 MG PO TABS: ORAL_TABLET | ORAL | 3 refills | Status: DC

## 2022-10-24 ENCOUNTER — Encounter: Payer: Self-pay | Admitting: Internal Medicine

## 2022-10-30 DIAGNOSIS — E785 Hyperlipidemia, unspecified: Secondary | ICD-10-CM | POA: Diagnosis not present

## 2022-11-02 DIAGNOSIS — E785 Hyperlipidemia, unspecified: Secondary | ICD-10-CM | POA: Diagnosis not present

## 2022-11-02 DIAGNOSIS — R7989 Other specified abnormal findings of blood chemistry: Secondary | ICD-10-CM | POA: Diagnosis not present

## 2022-11-02 DIAGNOSIS — M810 Age-related osteoporosis without current pathological fracture: Secondary | ICD-10-CM | POA: Diagnosis not present

## 2022-11-02 DIAGNOSIS — R7301 Impaired fasting glucose: Secondary | ICD-10-CM | POA: Diagnosis not present

## 2022-11-02 DIAGNOSIS — Z79899 Other long term (current) drug therapy: Secondary | ICD-10-CM | POA: Diagnosis not present

## 2022-11-06 DIAGNOSIS — Z0000 Encounter for general adult medical examination without abnormal findings: Secondary | ICD-10-CM | POA: Diagnosis not present

## 2022-11-06 DIAGNOSIS — Z950 Presence of cardiac pacemaker: Secondary | ICD-10-CM | POA: Diagnosis not present

## 2022-11-06 DIAGNOSIS — Z23 Encounter for immunization: Secondary | ICD-10-CM | POA: Diagnosis not present

## 2022-11-06 DIAGNOSIS — R7301 Impaired fasting glucose: Secondary | ICD-10-CM | POA: Diagnosis not present

## 2022-11-06 DIAGNOSIS — Z1331 Encounter for screening for depression: Secondary | ICD-10-CM | POA: Diagnosis not present

## 2022-11-06 DIAGNOSIS — I442 Atrioventricular block, complete: Secondary | ICD-10-CM | POA: Diagnosis not present

## 2022-11-06 DIAGNOSIS — E785 Hyperlipidemia, unspecified: Secondary | ICD-10-CM | POA: Diagnosis not present

## 2022-11-06 DIAGNOSIS — M810 Age-related osteoporosis without current pathological fracture: Secondary | ICD-10-CM | POA: Diagnosis not present

## 2022-11-06 DIAGNOSIS — I201 Angina pectoris with documented spasm: Secondary | ICD-10-CM | POA: Diagnosis not present

## 2022-11-06 DIAGNOSIS — Z1339 Encounter for screening examination for other mental health and behavioral disorders: Secondary | ICD-10-CM | POA: Diagnosis not present

## 2022-11-06 DIAGNOSIS — R82998 Other abnormal findings in urine: Secondary | ICD-10-CM | POA: Diagnosis not present

## 2022-11-06 DIAGNOSIS — E669 Obesity, unspecified: Secondary | ICD-10-CM | POA: Diagnosis not present

## 2022-11-08 DIAGNOSIS — L720 Epidermal cyst: Secondary | ICD-10-CM | POA: Diagnosis not present

## 2022-11-12 ENCOUNTER — Other Ambulatory Visit: Payer: Self-pay | Admitting: Internal Medicine

## 2022-11-12 DIAGNOSIS — Z1231 Encounter for screening mammogram for malignant neoplasm of breast: Secondary | ICD-10-CM

## 2022-11-20 ENCOUNTER — Other Ambulatory Visit (HOSPITAL_BASED_OUTPATIENT_CLINIC_OR_DEPARTMENT_OTHER): Payer: Self-pay | Admitting: Obstetrics & Gynecology

## 2022-11-20 ENCOUNTER — Encounter: Payer: Self-pay | Admitting: Internal Medicine

## 2022-11-20 DIAGNOSIS — R232 Flushing: Secondary | ICD-10-CM

## 2022-11-28 DIAGNOSIS — L669 Cicatricial alopecia, unspecified: Secondary | ICD-10-CM | POA: Diagnosis not present

## 2022-11-30 ENCOUNTER — Ambulatory Visit
Admission: RE | Admit: 2022-11-30 | Discharge: 2022-11-30 | Disposition: A | Discharge status: Home / Self Care | Payer: Medicare PPO | Source: Ambulatory Visit | Attending: Internal Medicine | Admitting: Internal Medicine

## 2022-11-30 DIAGNOSIS — Z1231 Encounter for screening mammogram for malignant neoplasm of breast: Secondary | ICD-10-CM

## 2022-12-04 ENCOUNTER — Other Ambulatory Visit (HOSPITAL_BASED_OUTPATIENT_CLINIC_OR_DEPARTMENT_OTHER): Payer: Self-pay

## 2022-12-04 ENCOUNTER — Ambulatory Visit: Payer: Medicare PPO

## 2022-12-04 DIAGNOSIS — I442 Atrioventricular block, complete: Secondary | ICD-10-CM | POA: Diagnosis not present

## 2022-12-04 DIAGNOSIS — L720 Epidermal cyst: Secondary | ICD-10-CM | POA: Diagnosis not present

## 2022-12-04 DIAGNOSIS — D3610 Benign neoplasm of peripheral nerves and autonomic nervous system, unspecified: Secondary | ICD-10-CM | POA: Diagnosis not present

## 2022-12-04 DIAGNOSIS — L989 Disorder of the skin and subcutaneous tissue, unspecified: Secondary | ICD-10-CM | POA: Diagnosis not present

## 2022-12-04 LAB — CUP PACEART REMOTE DEVICE CHECK
Battery Remaining Longevity: 146 mo
Battery Voltage: 3.17 V
Brady Statistic AP VP Percent: 19.56 %
Brady Statistic AP VS Percent: 0 %
Brady Statistic AS VP Percent: 80.08 %
Brady Statistic AS VS Percent: 0.35 %
Brady Statistic RA Percent Paced: 19.44 %
Brady Statistic RV Percent Paced: 99.64 %
Date Time Interrogation Session: 20240930195038
Implantable Lead Connection Status: 753985
Implantable Lead Connection Status: 753985
Implantable Lead Implant Date: 20240701
Implantable Lead Implant Date: 20240701
Implantable Lead Location: 753859
Implantable Lead Location: 753860
Implantable Lead Model: 3830
Implantable Lead Model: 5076
Implantable Pulse Generator Implant Date: 20240701
Lead Channel Impedance Value: 342 Ohm
Lead Channel Impedance Value: 361 Ohm
Lead Channel Impedance Value: 418 Ohm
Lead Channel Impedance Value: 475 Ohm
Lead Channel Pacing Threshold Amplitude: 0.375 V
Lead Channel Pacing Threshold Amplitude: 0.5 V
Lead Channel Pacing Threshold Pulse Width: 0.4 ms
Lead Channel Pacing Threshold Pulse Width: 0.4 ms
Lead Channel Sensing Intrinsic Amplitude: 21.125 mV
Lead Channel Sensing Intrinsic Amplitude: 21.125 mV
Lead Channel Sensing Intrinsic Amplitude: 4.625 mV
Lead Channel Sensing Intrinsic Amplitude: 4.625 mV
Lead Channel Setting Pacing Amplitude: 1.5 V
Lead Channel Setting Pacing Amplitude: 2 V
Lead Channel Setting Pacing Pulse Width: 0.4 ms
Lead Channel Setting Sensing Sensitivity: 1.2 mV
Zone Setting Status: 755011

## 2022-12-06 ENCOUNTER — Other Ambulatory Visit (HOSPITAL_BASED_OUTPATIENT_CLINIC_OR_DEPARTMENT_OTHER): Payer: Self-pay

## 2022-12-19 NOTE — Progress Notes (Signed)
Remote pacemaker transmission.   

## 2022-12-25 ENCOUNTER — Other Ambulatory Visit (HOSPITAL_BASED_OUTPATIENT_CLINIC_OR_DEPARTMENT_OTHER): Payer: Self-pay | Admitting: Obstetrics & Gynecology

## 2022-12-25 ENCOUNTER — Encounter: Payer: Medicare PPO | Admitting: Internal Medicine

## 2022-12-25 NOTE — Telephone Encounter (Signed)
LMOM at 2:35 for patient to call office. Would like to inquire about Rx. tbw

## 2022-12-28 ENCOUNTER — Other Ambulatory Visit (HOSPITAL_COMMUNITY)
Admission: RE | Admit: 2022-12-28 | Discharge: 2022-12-28 | Disposition: A | Discharge status: Home / Self Care | Payer: Medicare PPO | Source: Ambulatory Visit | Attending: Obstetrics & Gynecology | Admitting: Obstetrics & Gynecology

## 2022-12-28 ENCOUNTER — Encounter (HOSPITAL_BASED_OUTPATIENT_CLINIC_OR_DEPARTMENT_OTHER): Payer: Self-pay | Admitting: Obstetrics & Gynecology

## 2022-12-28 ENCOUNTER — Ambulatory Visit (INDEPENDENT_AMBULATORY_CARE_PROVIDER_SITE_OTHER): Payer: Medicare PPO | Admitting: Obstetrics & Gynecology

## 2022-12-28 VITALS — BP 105/70 | HR 76 | Ht 64.0 in | Wt 163.3 lb

## 2022-12-28 DIAGNOSIS — N951 Menopausal and female climacteric states: Secondary | ICD-10-CM | POA: Diagnosis not present

## 2022-12-28 DIAGNOSIS — Z124 Encounter for screening for malignant neoplasm of cervix: Secondary | ICD-10-CM

## 2022-12-28 DIAGNOSIS — M8580 Other specified disorders of bone density and structure, unspecified site: Secondary | ICD-10-CM

## 2022-12-28 DIAGNOSIS — Z79899 Other long term (current) drug therapy: Secondary | ICD-10-CM | POA: Diagnosis not present

## 2022-12-28 DIAGNOSIS — C8595 Non-Hodgkin lymphoma, unspecified, lymph nodes of inguinal region and lower limb: Secondary | ICD-10-CM | POA: Diagnosis not present

## 2022-12-28 DIAGNOSIS — Z01419 Encounter for gynecological examination (general) (routine) without abnormal findings: Secondary | ICD-10-CM | POA: Diagnosis not present

## 2022-12-28 DIAGNOSIS — M4800 Spinal stenosis, site unspecified: Secondary | ICD-10-CM

## 2022-12-28 DIAGNOSIS — D219 Benign neoplasm of connective and other soft tissue, unspecified: Secondary | ICD-10-CM | POA: Diagnosis not present

## 2022-12-28 DIAGNOSIS — I442 Atrioventricular block, complete: Secondary | ICD-10-CM

## 2022-12-28 MED ORDER — VEOZAH 45 MG PO TABS: 45.0000 mg | ORAL_TABLET | Freq: Every day | ORAL | 3 refills | Status: DC

## 2022-12-28 MED ORDER — GABAPENTIN 300 MG PO CAPS: ORAL_CAPSULE | ORAL | 11 refills | Status: DC

## 2022-12-28 NOTE — Progress Notes (Signed)
72 y.o. G0P0 Single Black or [place] American female here for breast and pelvic exam.  H/o significant vasomotor symptoms.  Was diagnosed with microvascular angina several years ago.  Has not been on estrogens.  Has been diagnosed with heart block since I saw her last.  Has a pacemaker now.  Seeing Dr. Tenny Craw and will have follow up with Dr. Ladona Ridgel 10/31.  Pacemaker was placed 7/1.  This battery should last about 12 years.   Denies vaginal bleeding.  On [place]  Patient's last menstrual period was 11/04/1990.          Sexually active: No.  The current method of family planning is post menopausal status.    Exercising: hiking Smoker:  no  Health Maintenance: Pap:  09/2020 History of abnormal Pap:  no MMG:  11/2022 Colonoscopy:  02/2021, follow up 10 years.   BMD:   2022 with Dr. Clelia Croft Screening Labs: does with Dr. Clelia Croft   reports that she has never smoked. She has never used smokeless tobacco. She reports current alcohol use. She reports that she does not use drugs.  Past Medical History:  Diagnosis Date   Anginal pain (HCC)    Bursitis    left hip    Chronic constipation    DI (detrusor instability)    Femur fracture (HCC) 2011   Fibroid    Headache    Heart block AV third degree (HCC)    Menopausal symptoms    Non-Hodgkin lymphoma (HCC) 1992   treated with CHOP   Osteoporosis    h/o 3 stress fractures   Tennis elbow Right    Trigger finger    bilateral     Past Surgical History:  Procedure Laterality Date   Biopsy for Non Hodgkins Lymphoma  1992   CARPAL TUNNEL RELEASE Right 09/11/2019   Procedure: CARPAL TUNNEL RELEASE;  Surgeon: Betha Loa, MD;  Location: [facility];  Service: Orthopedics;  Laterality: Right;   DILATION AND CURETTAGE OF UTERUS     PACEMAKER IMPLANT N/A 09/03/2022   Procedure: PACEMAKER IMPLANT;  Surgeon: Marinus Maw, MD;  Location: [facility];  Service: Cardiovascular;  Laterality: N/A;   PELVIC LAPAROSCOPY     TOE SURGERY      TRIGGER FINGER RELEASE Left 08/04/2018   Procedure: RELEASE TRIGGER FINGER/A-1 PULLEY;  Surgeon: Betha Loa, MD;  Location: [facility];  Service: Orthopedics;  Laterality: Left;    Current Outpatient Medications  Medication Sig Dispense Refill   acetaminophen (TYLENOL) 500 MG tablet Take 2 tablets (1,000 mg total) by mouth every 6 (six) hours as needed. (Patient taking differently: Take 1,000 mg by mouth every 6 (six) hours as needed for moderate pain (pain score 4-6).) 30 tablet 0   alendronate (FOSAMAX) 70 MG tablet Take 1 tablet by mouth once a week.     aspirin 81 MG EC tablet TAKE 1 TABLET BY MOUTH EVERY DAY (Patient taking differently: Take 81 mg by mouth daily.) 30 tablet 1   brimonidine (ALPHAGAN) 0.2 % ophthalmic solution Place 1 drop into both eyes 2 (two) times daily.     Calcium Carbonate-Vitamin D (CALCIUM + D PO) Take 1 tablet by mouth 2 (two) times daily.      cetirizine (ZYRTEC) 10 MG tablet Take 10 mg by mouth every evening.     Cholecalciferol (VITAMIN D) 50 MCG (2000 UT) CAPS Take 2,000 Units by mouth in the morning and at bedtime.     CINNAMON PO Take 1  tablet by mouth daily.     Cyanocobalamin (VITAMIN B 12 PO) Take 1 tablet by mouth daily.      desvenlafaxine (PRISTIQ) 25 MG 24 hr tablet TAKE 1 TABLET (25 MG TOTAL) BY MOUTH DAILY. 90 tablet 0   Fezolinetant (VEOZAH) 45 MG TABS Take 45 mg by mouth daily.     finasteride (PROSCAR) 5 MG tablet Take 2.5 mg by mouth daily.     gabapentin (NEURONTIN) 300 MG capsule TAKE 1 PILL IN THE MORNING (300 MG). TAKE 2 PILLS IN THE EVENING (600 MG). (Patient taking differently: Take 300 mg by mouth at bedtime.) 90 capsule 11   latanoprost (XALATAN) 0.005 % ophthalmic solution Place 1 drop into both eyes at bedtime.     Magnesium 200 MG TABS Take 200 mg by mouth at bedtime.     minoxidil (LONITEN) 2.5 MG tablet Take 1.25 mg by mouth at bedtime.     Multiple Vitamin (MULTIVITAMIN) tablet Take 1 tablet by mouth daily.      pravastatin (PRAVACHOL) 20 MG tablet TAKE 1 TABLET BY MOUTH EVERY DAY IN THE EVENING 90 tablet 3   rizatriptan (MAXALT-MLT) 10 MG disintegrating tablet Take 10 mg by mouth as needed for migraine.     Semaglutide-Weight Management (WEGOVY) 1.7 MG/0.75ML SOAJ Inject 1.7 mg into the skin every Thursday.     tacrolimus (PROTOPIC) 0.1 % ointment Apply 1 Application topically See admin instructions. 1 application for 5 days on and 2 days off     nitroGLYCERIN (NITROSTAT) 0.4 MG SL tablet Place 1 tablet (0.4 mg total) under the tongue every 5 (five) minutes as needed for chest pain. 25 tablet 3   No current facility-administered medications for this visit.    Family History  Problem Relation Age of Onset   Diabetes Mother 17   Hypertension Mother    Alzheimer's disease Mother    Glaucoma Mother    Heart disease Mother    Hypertension Father    Alcoholism Father        deceased age 50   Cirrhosis Father    Diabetes Brother    Hypertension Brother    Colon cancer Maternal Uncle    Heart disease Maternal Grandmother    Breast cancer Cousin        Mat. 1st cousin-Age 67   Breast cancer Maternal Aunt        Age 81's   Heart disease Maternal Aunt    Other Paternal Grandmother        MI    ROS: Constitutional: negative Genitourinary:negative  Exam:   BP 105/70 (BP Location: Left Arm, Patient Position: Sitting, Cuff Size: Normal)   Pulse 76   Ht 5\' 4"  (1.626 m)   Wt 163 lb 4.8 oz (74.1 kg)   LMP 11/04/1990   BMI 28.03 kg/m   Height: 5\' 4"  (162.6 cm)  General appearance: alert, cooperative and appears stated age Head: Normocephalic, without obvious abnormality, atraumatic Neck: no adenopathy, supple, symmetrical, trachea midline and thyroid normal to inspection and palpation Lungs: clear to auscultation bilaterally Breasts: normal appearance, no masses or tenderness Heart: regular rate and rhythm Abdomen: soft, non-tender; bowel sounds normal; no masses,  no  organomegaly Extremities: extremities normal, atraumatic, no cyanosis or edema Skin: Skin color, texture, turgor normal. No rashes or lesions Lymph nodes: Cervical, supraclavicular, and axillary nodes normal. No abnormal inguinal nodes palpated Neurologic: Grossly normal   Pelvic: External genitalia:  no lesions  Urethra:  normal appearing urethra with no masses, tenderness or lesions              Bartholins and Skenes: normal                 Vagina: normal appearing vagina with normal color and no discharge, no lesions              Cervix: no lesions              Pap taken: Yes.   Bimanual Exam:  Uterus:  normal size, contour, position, consistency, mobility, non-tender              Adnexa: normal adnexa and no mass, fullness, tenderness               Rectovaginal: Confirms               Anus:  normal sphincter tone, no lesions  Chaperone, Hendricks Milo, CMA, was present for exam.  Assessment/Plan: 1. Encntr for gyn exam (general) (routine) w/o abn findings - Pap smear obtained today.  Will plan this to be last one unless new issues. - Mammogram 11/2022 - Colonoscopy 02/2021, follow up 10 years - Bone mineral density 2022 with Dr. Clelia Croft - lab work done with PCP, Dr. Clelia Croft - vaccines reviewed/updated  2. Vasomotor symptoms due to menopause - Fezolinetant (VEOZAH) 45 MG TABS; Take 1 tablet (45 mg total) by mouth daily.  Dispense: 90 tablet; Refill: 3 - Hepatic function panel  3. High risk medication use - Hepatic function panel  4. Osteopenia, unspecified location  5. Complete heart block (HCC) - has pacemaker, followed by Dr. Tenny Craw and Dr. Ladona Ridgel  6. Spinal stenosis, unspecified spinal region - she is going to see Dr. Darrick Penna this year but, for now, RF completed for pt - gabapentin (NEURONTIN) 300 MG capsule; Take 1 pill in the morning (300 mg). Take 2 pills in the evening (600 mg).  Dispense: 90 capsule; Refill: 11  7. Fibroid - stable exam  8. 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

## 2022-12-29 LAB — HEPATIC FUNCTION PANEL
ALT: 20 [IU]/L (ref 0–32)
AST: 31 [IU]/L (ref 0–40)
Albumin: 4.5 g/dL (ref 3.8–4.8)
Alkaline Phosphatase: 68 [IU]/L (ref 44–121)
Bilirubin Total: 0.3 mg/dL (ref 0.0–1.2)
Bilirubin, Direct: 0.12 mg/dL (ref 0.00–0.40)
Total Protein: 7.3 g/dL (ref 6.0–8.5)

## 2022-12-31 ENCOUNTER — Other Ambulatory Visit (INDEPENDENT_AMBULATORY_CARE_PROVIDER_SITE_OTHER): Payer: Self-pay

## 2022-12-31 ENCOUNTER — Encounter: Payer: Self-pay | Admitting: Sports Medicine

## 2022-12-31 ENCOUNTER — Other Ambulatory Visit (INDEPENDENT_AMBULATORY_CARE_PROVIDER_SITE_OTHER): Payer: Medicare PPO

## 2022-12-31 ENCOUNTER — Ambulatory Visit: Payer: Medicare PPO | Admitting: Sports Medicine

## 2022-12-31 DIAGNOSIS — M722 Plantar fascial fibromatosis: Secondary | ICD-10-CM

## 2022-12-31 DIAGNOSIS — M79672 Pain in left foot: Secondary | ICD-10-CM | POA: Diagnosis not present

## 2022-12-31 DIAGNOSIS — M216X1 Other acquired deformities of right foot: Secondary | ICD-10-CM

## 2022-12-31 DIAGNOSIS — M79671 Pain in right foot: Secondary | ICD-10-CM | POA: Diagnosis not present

## 2022-12-31 DIAGNOSIS — M2041 Other hammer toe(s) (acquired), right foot: Secondary | ICD-10-CM | POA: Diagnosis not present

## 2022-12-31 MED ORDER — PREDNISONE 20 MG PO TABS: 20.0000 mg | ORAL_TABLET | Freq: Every day | ORAL | 0 refills | Status: AC

## 2022-12-31 NOTE — Progress Notes (Signed)
Deborah Fisher - 72 y.o. female MRN [id number]  Date of birth: 06-23-1950  Office Visit Note: Visit Date: 12/31/2022 PCP: Deborah Polka., MD Referred by: Deborah Polka., MD  Subjective: Chief Complaint  Patient presents with   Right Foot - Pain   Left Foot - Pain   HPI: Deborah Fisher is a very pleasant 72 y.o. female who presents today for bilateral heel pain and some plantar fasciitis pain.  This has bothered her on and off for years but has become exacerbated over the last 2-3 months.  Her left is worse than her right.  She is an avid hiker and hikes multiple times weekly.  She has been treating this herself conservatively with ice every other night as well as topical Voltaren gel.  She also feels she is dealing with some tendinitis of the anterior lateral ankle/foot ligaments from compensation.  She is managed on gabapentin 300 mg in the day and 600 mg at nighttime with a history of foraminal stenosis.  He has been seen by Deborah Fisher fields at [facility] in the past, she has 2 different orthotics but 1 was too rigid and the other ones are starting to wear out for her.  Pertinent ROS were reviewed with the patient and found to be negative unless otherwise specified above in HPI.   Assessment & Plan: Visit Diagnoses:  1. Bilateral foot pain   2. Plantar fasciitis, bilateral   3. Loss of transverse plantar arch of right foot   4. Acquired hammertoe of right foot    Plan: Discussed with Deborah Fisher the nature of her bilateral, left greater than right foot and plantar fascia pain.  She does have loss of transverse arch and I do think her foot shape is contributing to her pain.  She is an avid hiker and I would still like her to continue to do so as her pain allows.  Through shared decision-making, we did proceed with trial of extracorporeal shockwave therapy for bilateral plantar fascia and plantar arch of the feet.  I did replace her metatarsal pad on both orthotics and would like her to use  the more rigid orthotics as her previous [facility] orthotics are quite worn.  She may need to have new orthotics made depending on how she does with above.  She is having some tendinitis flareup of the left foot which is likely compensatory from her plantar fascia pain.  We will start her on a short course of prednisone 20 mg for only 1 week and then may discontinue.  I would like to see her back in about 1 week for reevaluation and repeat shockwave treatment therapy.  Follow-up: Return in about 1 week (around 01/07/2023) for Next Wed AM-time for L > R plantar fascia (reg visit).   Meds & Orders:  Meds ordered this encounter  Medications   predniSONE (DELTASONE) 20 MG tablet    Sig: Take 1 tablet (20 mg total) by mouth daily with breakfast for 7 days.    Dispense:  7 tablet    Refill:  0    Orders Placed This Encounter  Procedures   XR Ankle Complete Right   XR Ankle Complete Left     Procedures: Procedure: ECSWT Indications:  Plantar fasciitis   Procedure Details Consent: Risks of procedure as well as the alternatives and risks of each were explained to the patient.  Verbal consent for procedure obtained. Time Out: Verified patient identification, verified procedure, site was marked, verified correct  patient position. The area was cleaned with alcohol swab.     The left plantar fascia and foot arch was targeted for Extracorporeal shockwave therapy.    Preset: Plantar fasciitis Power Level: 90 mJ Frequency: 10 Hz Impulse/cycles: 2250 Head size: Regular   Patient tolerated procedure well without immediate complications.  The right plantar fascia and foot arch was targeted for Extracorporeal shockwave therapy.    Preset: Plantar fasciitis Power Level: 90 mJ Frequency: 10 Hz Impulse/cycles: 2000 Head size: Regular   Patient tolerated procedure well without immediate complications.         Clinical History: No specialty comments available.  She reports that she has never  smoked. She has never used smokeless tobacco. No results for input(s): "HGBA1C", "LABURIC" in the last 8760 hours.  Objective:   Vital Signs: LMP 11/04/1990   Physical Exam  Gen: Well-appearing, in no acute distress; non-toxic WN:[id number]. Warm.  Resp: Breathing unlabored on room air; no wheezing. Psych: Fluid speech in conversation; appropriate affect; normal thought process Neuro: Sensation intact throughout. No gross coordination deficits.   Ortho Exam - Bilateral feet: Evaluation of bilateral feet shows moderate loss of transverse arch but relatively well-preserved longitudinal arch.  There is prior surgery of the fifth toe on the right with sewn together fourth aspect digit.  There is splaying on the right between toes 3 and 4, left 1 and 2.  Hammertoe deformities of the second and third toes on the right.  Imaging: XR Ankle Complete Left  Result Date: 12/31/2022 3 views of the left foot and ankle including AP, lateral and oblique view were ordered and reviewed by myself.  No acute fracture or bony abnormality noted.  There is moderate preservation of the longitudinal arch bilaterally.  XR Ankle Complete Right  Result Date: 12/31/2022 3 views of the right ankle including AP, lateral and oblique film were ordered and reviewed by myself.  There is a very small plantar calcaneal spur.  Otherwise no acute bony fracture or otherwise bony abnormality noted.   Past Medical/Family/Surgical/Social History: Medications & Allergies reviewed per EMR, new medications updated. Patient Active Problem List   Diagnosis Date Noted   Complete heart block (HCC) 09/03/2022   Hot flashes 08/03/2021   Spinal stenosis of lumbar region 06/11/2019   Bilateral carpal tunnel syndrome 03/18/2019   INOCA 12/22/2017   Abnormality of gait 01/25/2016   Left foot pain 01/01/2016   Foot pain, right 01/01/2016   Callus of foot 01/01/2016   Headache, migraine 02/10/2014   Osteoporosis    DI (detrusor  instability)    Fibroid    Menopausal symptoms    Non-Hodgkin lymphoma (HCC)    Past Medical History:  Diagnosis Date   Anginal pain (HCC)    Bursitis    left hip    Chronic constipation    DI (detrusor instability)    Femur fracture (HCC) 2011   Fibroid    Headache    Heart block AV third degree (HCC)    Menopausal symptoms    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 1st cousin-Age 38   Breast cancer Maternal Aunt        Age 18's   Heart disease Maternal Aunt    Other Paternal Grandmother        MI   Past Surgical History:  Procedure Laterality Date   Biopsy for Non Hodgkins Lymphoma  1992   CARPAL TUNNEL RELEASE Right 09/11/2019   Procedure: CARPAL TUNNEL RELEASE;  Surgeon: Betha Loa, MD;  Location: [facility];  Service: Orthopedics;  Laterality: Right;   DILATION AND CURETTAGE OF UTERUS     PACEMAKER IMPLANT N/A 09/03/2022   Procedure: PACEMAKER IMPLANT;  Surgeon: Marinus Maw, MD;  Location: [facility];  Service: Cardiovascular;  Laterality: N/A;   PELVIC LAPAROSCOPY     TOE SURGERY     TRIGGER FINGER RELEASE Left 08/04/2018   Procedure: RELEASE TRIGGER FINGER/A-1 PULLEY;  Surgeon: Betha Loa, MD;  Location: [facility];  Service: Orthopedics;  Laterality: Left;   Social History   Occupational History    Employer: [employer]    Comment: Ass. Dean and Professor  Tobacco Use   Smoking status: Never   Smokeless tobacco:  Never  Vaping Use   Vaping status: Never Used  Substance and Sexual Activity   Alcohol use: Yes    Comment: social   Drug use: No   Sexual activity: Not Currently    Partners: Male    Birth control/protection: Post-menopausal

## 2022-12-31 NOTE — Progress Notes (Signed)
Patient says that she has been having pain in the heel and arch of both feet for about one year. She says that left is more painful than right. Patient says that she hikes every other day; yesterday she hiked 9 miles. She says that she has been treating with ice on the feet for 10-15 minutes every night and Voltaren gel. She says that she does get relief from these. Patient also mentions pain throughout the left foot. She says that she has Gabapentin for spinal stenosis.

## 2023-01-01 ENCOUNTER — Encounter: Payer: Self-pay | Admitting: Internal Medicine

## 2023-01-01 LAB — CYTOLOGY - PAP: Diagnosis: NEGATIVE

## 2023-01-03 ENCOUNTER — Ambulatory Visit: Payer: Medicare PPO | Attending: Internal Medicine | Admitting: Internal Medicine

## 2023-01-03 ENCOUNTER — Encounter: Payer: Self-pay | Admitting: Internal Medicine

## 2023-01-03 VITALS — BP 120/74 | HR 68 | Ht 64.0 in | Wt 161.6 lb

## 2023-01-03 DIAGNOSIS — I442 Atrioventricular block, complete: Secondary | ICD-10-CM | POA: Diagnosis not present

## 2023-01-03 LAB — CUP PACEART INCLINIC DEVICE CHECK
Battery Remaining Longevity: 148 mo
Battery Voltage: 3.16 V
Brady Statistic AP VP Percent: 21.99 %
Brady Statistic AP VS Percent: 0 %
Brady Statistic AS VP Percent: 77.57 %
Brady Statistic AS VS Percent: 0.44 %
Brady Statistic RA Percent Paced: 21.82 %
Brady Statistic RV Percent Paced: 99.56 %
Date Time Interrogation Session: 20241031165243
Implantable Lead Connection Status: 753985
Implantable Lead Connection Status: 753985
Implantable Lead Implant Date: 20240701
Implantable Lead Implant Date: 20240701
Implantable Lead Location: 753859
Implantable Lead Location: 753860
Implantable Lead Model: 3830
Implantable Lead Model: 5076
Implantable Pulse Generator Implant Date: 20240701
Lead Channel Impedance Value: 361 Ohm
Lead Channel Impedance Value: 380 Ohm
Lead Channel Impedance Value: 494 Ohm
Lead Channel Impedance Value: 532 Ohm
Lead Channel Pacing Threshold Amplitude: 0.375 V
Lead Channel Pacing Threshold Amplitude: 0.5 V
Lead Channel Pacing Threshold Pulse Width: 0.4 ms
Lead Channel Pacing Threshold Pulse Width: 0.4 ms
Lead Channel Sensing Intrinsic Amplitude: 20.75 mV
Lead Channel Sensing Intrinsic Amplitude: 29.625 mV
Lead Channel Sensing Intrinsic Amplitude: 3.875 mV
Lead Channel Sensing Intrinsic Amplitude: 5.875 mV
Lead Channel Setting Pacing Amplitude: 1.5 V
Lead Channel Setting Pacing Amplitude: 2 V
Lead Channel Setting Pacing Pulse Width: 0.4 ms
Lead Channel Setting Sensing Sensitivity: 1.2 mV
Zone Setting Status: 755011

## 2023-01-03 NOTE — Patient Instructions (Signed)
Medication Instructions:  Your physician recommends that you continue on your current medications as directed. Please refer to the Current Medication list given to you today.  *If you need a refill on your cardiac medications before your next appointment, please call your pharmacy*  Lab Work: If you have labs (blood work) drawn today and your tests are completely normal, you will receive your results only by: MyChart Message (if you have MyChart) OR A paper copy in the mail If you have any lab test that is abnormal or we need to change your treatment, we will call you to review the results.  Follow-Up: At [facility], you and your health needs are our priority.  As part of our continuing mission to provide you with exceptional heart care, we have created designated Provider Care Teams.  These Care Teams include your primary Cardiologist (physician) and Advanced Practice Providers (APPs -  Physician Assistants and Nurse Practitioners) who all work together to provide you with the care you need, when you need it.  We recommend signing up for the patient portal called "MyChart".  Sign up information is provided on this After Visit Summary.  MyChart is used to connect with patients for Virtual Visits (Telemedicine).  Patients are able to view lab/test results, encounter notes, upcoming appointments, etc.  Non-urgent messages can be sent to your provider as well.   To learn more about what you can do with MyChart, go to [email or web address].    Your next appointment:   6 month(s)  Provider:   Dietrich Pates, MD     Other Instructions Your physician recommends that you schedule a follow-up appointment in: 1 year with Dr. Ladona Ridgel.

## 2023-01-03 NOTE — Progress Notes (Signed)
HPI Deborah Fisher returns for ongoing evaluation of heart block s/p PPM insertion.. 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 Katrinka Blazing. 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 She tells me that while hiking she and an episode of near syncope. She underwent PPM insertion about 3 months ago and has done well in the interim.  Allergies  Allergen Reactions   Iodinated Contrast Media Other (See Comments)     Warm feeling once   Tape Rash     Current Outpatient Medications  Medication Sig Dispense Refill   rosuvastatin (CRESTOR) 10 MG tablet Take 10 mg by mouth daily.     acetaminophen (TYLENOL) 500 MG tablet Take 2 tablets (1,000 mg total) by mouth every 6 (six) hours as needed. (Patient taking differently: Take 1,000 mg by mouth every 6 (six) hours as needed for moderate pain (pain score 4-6).) 30 tablet 0   alendronate (FOSAMAX) 70 MG tablet Take 1 tablet by mouth once a week.     aspirin 81 MG EC tablet TAKE 1 TABLET BY MOUTH EVERY DAY (Patient taking differently: Take 81 mg by mouth daily.) 30 tablet 1   brimonidine (ALPHAGAN) 0.2 % ophthalmic solution Place 1 drop into both eyes 2 (two) times daily.     Calcium Carbonate-Vitamin D (CALCIUM + D PO) Take 1 tablet by mouth 2 (two) times daily.      cetirizine (ZYRTEC) 10 MG tablet Take 10 mg by mouth every evening.     Cholecalciferol (VITAMIN D) 50 MCG (2000 UT) CAPS Take 2,000 Units by mouth in the morning and at bedtime.     CINNAMON PO Take 1 tablet by mouth daily. (Patient not taking: Reported on 01/03/2023)     Cyanocobalamin (VITAMIN B 12 PO) Take 1 tablet by mouth daily.      desvenlafaxine (PRISTIQ) 25 MG 24 hr tablet TAKE 1 TABLET (25 MG TOTAL) BY MOUTH DAILY. (Patient not taking: Reported on  01/03/2023) 90 tablet 0   Fezolinetant (VEOZAH) 45 MG TABS Take 1 tablet (45 mg total) by mouth daily. 90 tablet 3   finasteride (PROSCAR) 5 MG tablet Take 2.5 mg by mouth daily.     gabapentin (NEURONTIN) 300 MG capsule Take 1 pill in the morning (300 mg). Take 2 pills in the evening (600 mg). 90 capsule 11   latanoprost (XALATAN) 0.005 % ophthalmic solution Place 1 drop into both eyes at bedtime.     Magnesium 200 MG TABS Take 200 mg by mouth at bedtime.     minoxidil (LONITEN) 2.5 MG tablet Take 1.25 mg by mouth at bedtime.     Multiple Vitamin (MULTIVITAMIN) tablet Take 1 tablet by mouth daily.     nitroGLYCERIN (NITROSTAT) 0.4 MG SL tablet Place 1 tablet (0.4 mg total) under the tongue every 5 (five) minutes as needed for chest pain. 25 tablet 3   pravastatin (PRAVACHOL) 20 MG tablet TAKE 1 TABLET BY MOUTH EVERY DAY IN THE EVENING (Patient not taking: Reported on 01/03/2023) 90 tablet 3   predniSONE (DELTASONE) 20 MG tablet Take 1 tablet (20 mg total) by mouth daily with breakfast for 7 days. 7 tablet 0   rizatriptan (MAXALT-MLT) 10 MG disintegrating tablet Take 10 mg by mouth  as needed for migraine.     Semaglutide-Weight Management (WEGOVY) 1.7 MG/0.75ML SOAJ Inject 1.7 mg into the skin once a week. Pt takes on Sunday     tacrolimus (PROTOPIC) 0.1 % ointment Apply 1 Application topically See admin instructions. 1 application for 5 days on and 2 days off     No current facility-administered medications for this visit.     Past Medical History:  Diagnosis Date   Anginal pain (HCC)    Bursitis    left hip    Chronic constipation    DI (detrusor instability)    Femur fracture (HCC) 2011   Fibroid    Headache    Heart block AV third degree (HCC)    Menopausal symptoms    Non-Hodgkin lymphoma (HCC) 1992   treated with CHOP   Osteoporosis    h/o 3 stress fractures   Tennis elbow Right    Trigger finger    bilateral     ROS:   All systems reviewed and negative except as noted  in the HPI.   Past Surgical History:  Procedure Laterality Date   Biopsy for Non Hodgkins Lymphoma  1992   CARPAL TUNNEL RELEASE Right 09/11/2019   Procedure: CARPAL TUNNEL RELEASE;  Surgeon: Betha Loa, MD;  Location: [facility];  Service: Orthopedics;  Laterality: Right;   DILATION AND CURETTAGE OF UTERUS     PACEMAKER IMPLANT N/A 09/03/2022   Procedure: PACEMAKER IMPLANT;  Surgeon: Marinus Maw, MD;  [facility];  Service: Cardiovascular;  Laterality: N/A;   PELVIC LAPAROSCOPY     TOE SURGERY     TRIGGER FINGER RELEASE Left 08/04/2018   Procedure: RELEASE TRIGGER FINGER/A-1 PULLEY;  Surgeon: Betha Loa, MD;  Location: [facility];  Service: Orthopedics;  Laterality: Left;     Family History  Problem Relation Age of Onset   Diabetes Mother 20   Hypertension Mother    Alzheimer's disease Mother    Glaucoma Mother    Heart disease Mother    Hypertension Father    Alcoholism Father        deceased age 81   Cirrhosis Father    Diabetes Brother    Hypertension Brother    Colon cancer Maternal Uncle    Heart disease Maternal Grandmother    Breast cancer Cousin        Mat. 1st cousin-Age 12   Breast cancer Maternal Aunt        Age 7's   Heart disease Maternal Aunt    Other Paternal Grandmother        MI     Social History   Socioeconomic History   Marital status: Single    Spouse name: Not on file   Number of children: 0   Years of education: PhD   Highest education level: Not on file  Occupational History    Employer: [employer]    Comment: Ass. Dean and Professor  Tobacco Use   Smoking status: Never   Smokeless tobacco: Never  Vaping Use   Vaping status: Never Used  Substance and Sexual Activity   Alcohol use: Yes    Comment: social   Drug use: No   Sexual activity: Not Currently    Partners: Male    Birth control/protection: Post-menopausal  Other Topics Concern   Not on file  Social History Narrative   Not  on file   Social Determinants of Health   Financial Resource Strain: Not on  file  Food Insecurity: Not on file  Transportation Needs: Not on file  Physical Activity: Not on file  Stress: Not on file  Social Connections: Unknown (07/16/2021)   Received from [facility]   Social Network    Social Network: Not on file  Intimate Partner Violence: Unknown (06/07/2021)   [facility] from [facility]   HITS    Physically Hurt: Not on file    Insult or Talk Down To: Not on file    Threaten Physical Harm: Not on file    Scream or Curse: Not on file     BP 120/74 (BP Location: Left Arm, Patient Position: Sitting, Cuff Size: Normal)   Pulse 68   Ht 5\' 4"  (1.626 m)   Wt 161 lb 9.6 oz (73.3 kg)   LMP 11/04/1990   SpO2 99%   BMI 27.74 kg/m   Physical Exam:  Well appearing NAD HEENT: Unremarkable Neck:  No JVD, no thyromegally Lymphatics:  No adenopathy Back:  No CVA tenderness Lungs:  Clear with no wheezes. Well healed incision. HEART:  Regular rate rhythm, no murmurs, no rubs, no clicks Abd:  soft, positive bowel sounds, no organomegally, no rebound, no guarding Ext:  2 plus pulses, no edema, no cyanosis, no clubbing Skin:  No rashes no nodules Neuro:  CN II through XII intact, motor grossly intact  DEVICE  Normal device function.  See PaceArt for details.   Assess/Plan:  Intermittent CHB - she has developed symptoms and was not taking any AV nodal blocking drugs.  She feels well s/p PPM insertion. HTN - her bp is well controlled. No change in meds. 3. 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 Dyspnea -This has improved and she is walking 12 miles this weekend.     Deborah Gowda Wyatte Dames,MD

## 2023-01-11 ENCOUNTER — Encounter: Payer: Self-pay | Admitting: Sports Medicine

## 2023-01-11 ENCOUNTER — Ambulatory Visit: Payer: Medicare PPO | Admitting: Sports Medicine

## 2023-01-11 DIAGNOSIS — M79672 Pain in left foot: Secondary | ICD-10-CM | POA: Diagnosis not present

## 2023-01-11 DIAGNOSIS — M79671 Pain in right foot: Secondary | ICD-10-CM

## 2023-01-11 DIAGNOSIS — M722 Plantar fascial fibromatosis: Secondary | ICD-10-CM

## 2023-01-11 DIAGNOSIS — M216X1 Other acquired deformities of right foot: Secondary | ICD-10-CM

## 2023-01-11 DIAGNOSIS — M216X2 Other acquired deformities of left foot: Secondary | ICD-10-CM

## 2023-01-11 NOTE — Progress Notes (Signed)
Patient says that she is feeling pretty good. In the past week she has done one 15 mile hike and one 10 mile hike. She says that these felt good, she had some discomfort while hiking but no lingering pain or aching. She says that she completed her course of Prednisone on Monday. Today she has some discomfort in the left foot arch and heel, and the right foot feels good.

## 2023-01-11 NOTE — Progress Notes (Signed)
Deborah Fisher - 72 y.o. female MRN [id number]  Date of birth: 07/24/1950  Office Visit Note: Visit Date: 01/11/2023 PCP: Deborah Polka., MD Referred by: Deborah Polka., MD  Subjective: Chief Complaint  Patient presents with   Right Foot - Follow-up   Left Foot - Follow-up   HPI: Deborah Fisher is a pleasant 72 y.o. female who presents today for follow-up of bilateral foot and ankle pain.  Deborah Fisher states she is doing much improved.  Her right foot essentially has no pain, her left side is much improved but still feels some discomfort in the heel and arch of the foot.  She has been able to perform a 15 mile and a 10 mile hike without issues.  She did complete her prednisone course on Monday.  She is inquiring about silicone heel pads/support as she has had this in years past with good relief but cannot find her current ones.  She has not yet returned back into her modified orthotics with hiking. In the past she has been shown exercises and stretching for the Achilles and the plantar fascia, she does have some issues at home.  Pertinent ROS were reviewed with the patient and found to be negative unless otherwise specified above in HPI.   Assessment & Plan: Visit Diagnoses:  1. Bilateral foot pain   2. Plantar fasciitis, bilateral   3. Loss of transverse plantar arch of right foot   4. Loss of transverse plantar arch of left foot    Plan: Both Deborah Fisher and I were pleased with the degree of improvement she had of her bilateral foot pain near the plantar fascia.  She will discontinue further prednisone as she completed her weeklong course.  We did repeat extracorporeal shockwave therapy as she had a positive response to this over the plantar fascia in the arch of both feet.  We did discuss trialing her new orthotics with her metatarsal pad to help support her transverse arch.  I am okay with her slowly doing so and using whichever orthotics are most comfortable for her.  She  likely needs ones that have both rigid support as well as cushioning.  We did fit her for some silicone heel cups today which she found comfortable.  I would like her to maintain a few days a week of posterior chain stretching and strengthening of the plantar fascia, she does have some handouts at home from this, I would like her to do this 2-3 times weekly and add towel scrunch for her plantar fascia strengthening.  She will follow-up with me in about 2 weeks for reevaluation.  Consider additional use ESWT if finding further benefit.  Follow-up: Return in about 2 weeks (around 01/25/2023) for for L > R plantar (reg visit).   Meds & Orders: No orders of the defined types were placed in this encounter.  No orders of the defined types were placed in this encounter.   Procedures: Procedure: ECSWT Indications:  Plantar fasciitis   Procedure Details Consent: Risks of procedure as well as the alternatives and risks of each were explained to the patient.  Verbal consent for procedure obtained. Time Out: Verified patient identification, verified procedure, site was marked, verified correct patient position. The area was cleaned with alcohol swab.     The left plantar fascia and foot arch was targeted for Extracorporeal shockwave therapy.    Preset: Plantar fasciitis Power Level: 90 mJ Frequency: 11 Hz Impulse/cycles: 2300 Head size: Regular  Patient tolerated procedure well without immediate complications.   The right plantar fascia and foot arch was targeted for Extracorporeal shockwave therapy.    Preset: Plantar fasciitis Power Level: 90 mJ Frequency: 11 Hz Impulse/cycles: 2000 Head size: Regular   Patient tolerated procedure well without immediate complications.      Clinical History: No specialty comments available.  She reports that she has never smoked. She has never used smokeless tobacco. No results for input(s): "HGBA1C", "LABURIC" in the last 8760 hours.  Objective:    Vital Signs: LMP 11/04/1990   Physical Exam  Gen: Well-appearing, in no acute distress; non-toxic CV: Well-perfused. Warm.  Resp: Breathing unlabored on room air; no wheezing. Psych: Fluid speech in conversation; appropriate affect; normal thought process Neuro: Sensation intact throughout. No gross coordination deficits.   Ortho Exam - Bilateral feet: There is moderate loss of transverse arch bilaterally although relatively well-preserved longitudinal arch.  There is splaying on the right foot between toes 3 and 4 left foot 1-2.  There is hammertoe deformities of the second and third toe on the right.  The right foot has no tenderness over the plantar fascia or the heel, there is mild TTP at the plantar fascia insertion of the left foot.  Imaging: No results found.  Past Medical/Family/Surgical/Social History: Medications & Allergies reviewed per EMR, new medications updated. Patient Active Problem List   Diagnosis Date Noted   Complete heart block (HCC) 09/03/2022   Hot flashes 08/03/2021   Spinal stenosis of lumbar region 06/11/2019   Bilateral carpal tunnel syndrome 03/18/2019   INOCA 12/22/2017   Abnormality of gait 01/25/2016   Left foot pain 01/01/2016   Foot pain, right 01/01/2016   Callus of foot 01/01/2016   Headache, migraine 02/10/2014   Osteoporosis    DI (detrusor instability)    Fibroid    Menopausal symptoms    Non-Hodgkin lymphoma (HCC)    Past Medical History:  Diagnosis Date   Anginal pain (HCC)    Bursitis    left hip    Chronic constipation    DI (detrusor instability)    Femur fracture (HCC) 2011   Fibroid    Headache    Heart block AV third degree (HCC)    Menopausal symptoms    Non-Hodgkin lymphoma (HCC) 1992   treated with CHOP   Osteoporosis    h/o 3 stress fractures   Tennis elbow Right    Trigger finger    bilateral    Family History  Problem Relation Age of Onset   Diabetes Mother 20   Hypertension Mother    Alzheimer's disease  Mother    Glaucoma Mother    Heart disease Mother    Hypertension Father    Alcoholism Father        deceased age 49   Cirrhosis Father    Diabetes Brother    Hypertension Brother    Colon cancer Maternal Uncle    Heart disease Maternal Grandmother    Breast cancer Cousin        Mat. 1st cousin-Age 44   Breast cancer Maternal Aunt        Age 42's   Heart disease Maternal Aunt    Other Paternal Grandmother        MI   Past Surgical History:  Procedure Laterality Date   Biopsy for Non Hodgkins Lymphoma  1992   CARPAL TUNNEL RELEASE Right 09/11/2019   Procedure: CARPAL TUNNEL RELEASE;  Surgeon: Betha Loa, MD;  Location: [facility];  Service: Orthopedics;  Laterality: Right;   DILATION AND CURETTAGE OF UTERUS     PACEMAKER IMPLANT N/A 09/03/2022   Procedure: PACEMAKER IMPLANT;  Surgeon: Marinus Maw, MD;  [facility];  Service: Cardiovascular;  Laterality: N/A;   PELVIC LAPAROSCOPY     TOE SURGERY     TRIGGER FINGER RELEASE Left 08/04/2018   Procedure: RELEASE TRIGGER FINGER/A-1 PULLEY;  Surgeon: Betha Loa, MD;  Location: [facility];  Service: Orthopedics;  Laterality: Left;   Social History   Occupational History    Employer: [employer]    Comment: Ass. Dean and Professor  Tobacco Use   Smoking status: Never   Smokeless tobacco: Never  Vaping Use   Vaping status: Never Used  Substance and Sexual Activity   Alcohol use: Yes    Comment: social   Drug use: No   Sexual activity: Not Currently    Partners: Male    Birth control/protection: Post-menopausal   I spent 33 minutes in the care of the patient today including face-to-face time, preparation to see the patient, as well as review of previous ankle x-ray imaging, discussion on orthotics and shoewear modification, discussion on home exercise protocol and plantar fascia strengthening exercises for the above diagnoses.   Madelyn Brunner, DO Primary Care Sports Medicine Physician   [facility] - Orthopedics  This note was dictated using Dragon naturally speaking software and may contain errors in syntax, spelling, or content which have not been identified prior to signing this note.

## 2023-01-25 ENCOUNTER — Ambulatory Visit: Payer: Medicare PPO | Admitting: Sports Medicine

## 2023-02-04 ENCOUNTER — Ambulatory Visit: Payer: Medicare PPO | Admitting: Sports Medicine

## 2023-02-04 DIAGNOSIS — G5792 Unspecified mononeuropathy of left lower limb: Secondary | ICD-10-CM

## 2023-02-04 DIAGNOSIS — M722 Plantar fascial fibromatosis: Secondary | ICD-10-CM

## 2023-02-04 DIAGNOSIS — M79671 Pain in right foot: Secondary | ICD-10-CM

## 2023-02-04 DIAGNOSIS — M79672 Pain in left foot: Secondary | ICD-10-CM | POA: Diagnosis not present

## 2023-02-04 NOTE — Progress Notes (Unsigned)
Deborah Fisher - 72 y.o. female MRN [id number]  Date of birth: [birth year]  Office Visit Note: Visit Date: 02/04/2023 PCP: Cleatis Polka., MD Referred by: Cleatis Polka., MD  Subjective: Chief Complaint  Patient presents with  . Right Foot - Follow-up  . Left Foot - Follow-up   HPI: Deborah Fisher is a pleasant 72 y.o. female who presents today for ***  Pertinent ROS were reviewed with the patient and found to be negative unless otherwise specified above in HPI.   Assessment & Plan: Visit Diagnoses: No diagnosis found.  Plan: ***  Follow-up: No follow-ups on file.   Meds & Orders: No orders of the defined types were placed in this encounter.  No orders of the defined types were placed in this encounter.    Procedures: Procedure: ECSWT Indications:  Plantar fasciitis   Procedure Details Consent: Risks of procedure as well as the alternatives and risks of each were explained to the patient.  Verbal consent for procedure obtained. Time Out: Verified patient identification, verified procedure, site was marked, verified correct patient position. The area was cleaned with alcohol swab.     The left plantar fascia and foot arch was targeted for Extracorporeal shockwave therapy.    Preset: Plantar fasciitis Power Level: 90 mJ Frequency: 11 Hz Impulse/cycles: 2300 Head size: Regular   Patient tolerated procedure well without immediate complications.   The right plantar fascia and foot arch was targeted for Extracorporeal shockwave therapy.    Preset: Plantar fasciitis Power Level: 90 mJ Frequency: 11 Hz Impulse/cycles: 2000 Head size: Regular   Patient tolerated procedure well without immediate complications.      Clinical History: No specialty comments available.  She reports that she has never smoked. She has never used smokeless tobacco. No results for input(s): "HGBA1C", "LABURIC" in the last 8760 hours.  Objective:   Vital Signs: LMP 11/04/1990    Physical Exam  Gen: Well-appearing, in no acute distress; non-toxic CV: Regular Rate. Well-perfused. Warm.  Resp: Breathing unlabored on room air; no wheezing. Psych: Fluid speech in conversation; appropriate affect; normal thought process Neuro: Sensation intact throughout. No gross coordination deficits.   Ortho Exam - ***  Imaging: No results found.  Past Medical/Family/Surgical/Social History: Medications & Allergies reviewed per EMR, new medications updated. Patient Active Problem List   Diagnosis Date Noted  . Complete heart block (HCC) 09/03/2022  . Hot flashes 08/03/2021  . Spinal stenosis of lumbar region 06/11/2019  . Bilateral carpal tunnel syndrome 03/18/2019  . INOCA 12/22/2017  . Abnormality of gait 01/25/2016  . Left foot pain 01/01/2016  . Foot pain, right 01/01/2016  . Callus of foot 01/01/2016  . Headache, migraine 02/10/2014  . Osteoporosis   . DI (detrusor instability)   . Fibroid   . Menopausal symptoms   . Non-Hodgkin lymphoma [facility])    Past Medical History:  Diagnosis Date  . Anginal pain (HCC)   . Bursitis    left hip   . Chronic constipation   . DI (detrusor instability)   . Femur fracture (HCC) 2011  . Fibroid   . Headache   . Heart block AV third degree (HCC)   . Menopausal symptoms   . Non-Hodgkin lymphoma (HCC) 1992   treated with CHOP  . Osteoporosis    h/o 3 stress fractures  . Tennis elbow Right   . Trigger finger    bilateral    Family History  Problem Relation Age of Onset  .  Diabetes Mother 34  . Hypertension Mother   . Alzheimer's disease Mother   . Glaucoma Mother   . Heart disease Mother   . Hypertension Father   . Alcoholism Father        deceased age 58  . Cirrhosis Father   . Diabetes Brother   . Hypertension Brother   . Colon cancer Maternal Uncle   . Heart disease Maternal Grandmother   . Breast cancer Cousin        Mat. 1st cousin-Age 38  . Breast cancer Maternal Aunt        Age 32's  . Heart  disease Maternal Aunt   . Other Paternal Grandmother        MI   Past Surgical History:  Procedure Laterality Date  . Biopsy for Non Hodgkins Lymphoma  1992  . CARPAL TUNNEL RELEASE Right 09/11/2019   Procedure: CARPAL TUNNEL RELEASE;  Surgeon: Betha Loa, MD;  Location: [facility];  Service: Orthopedics;  Laterality: Right;  . DILATION AND CURETTAGE OF UTERUS    . PACEMAKER IMPLANT N/A 09/03/2022   Procedure: PACEMAKER IMPLANT;  Surgeon: Marinus Maw, MD;  Location: [facility];  Service: Cardiovascular;  Laterality: N/A;  . PELVIC LAPAROSCOPY    . TOE SURGERY    . 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 Smoking status: Never  . Smokeless tobacco: Never  Vaping Use  . Vaping status: Never Used  Substance and Sexual Activity  . Alcohol use: Yes    Comment: social  . Drug use: No  . Sexual activity: Not Currently    Partners: Male    Birth control/protection: Post-menopausal

## 2023-02-04 NOTE — Progress Notes (Unsigned)
Patient says that the pain is starting to return. She says that it has not kept her from hiking but she is feeling it. She says that she has not had to take Ibuprofen, use ice, or stretch. She says she is still wearing the orthotics and using the gel heel cups and those seem to help.

## 2023-02-05 ENCOUNTER — Encounter: Payer: Self-pay | Admitting: Sports Medicine

## 2023-02-11 ENCOUNTER — Encounter: Payer: Self-pay | Admitting: Sports Medicine

## 2023-02-11 ENCOUNTER — Ambulatory Visit: Payer: Medicare PPO | Admitting: Sports Medicine

## 2023-02-11 DIAGNOSIS — G5792 Unspecified mononeuropathy of left lower limb: Secondary | ICD-10-CM

## 2023-02-11 DIAGNOSIS — M79672 Pain in left foot: Secondary | ICD-10-CM

## 2023-02-11 DIAGNOSIS — M722 Plantar fascial fibromatosis: Secondary | ICD-10-CM | POA: Diagnosis not present

## 2023-02-11 DIAGNOSIS — M79671 Pain in right foot: Secondary | ICD-10-CM

## 2023-02-11 DIAGNOSIS — E569 Vitamin deficiency, unspecified: Secondary | ICD-10-CM | POA: Diagnosis not present

## 2023-02-11 NOTE — Progress Notes (Signed)
Patient says that overall she is feeling better than she was at her last visit. She says that she has been doing the towel scrunches from last time and those feel okay. She says that the left foot sometimes cramps up but otherwise is improving.

## 2023-02-11 NOTE — Progress Notes (Signed)
Deborah Fisher - 72 y.o. female MRN [id number]  Date of birth: 12/07/1950  Office Visit Note: Visit Date: 02/11/2023 PCP: Cleatis Polka., MD Referred by: Cleatis Polka., MD  Subjective: Chief Complaint  Patient presents with   Right Foot - Follow-up   Left Foot - Follow-up   HPI: Deborah Fisher is a pleasant 72 y.o. female who presents today for bilateral foot pain with plantar fasciitis, also with some cramping like sensation on the left dorsum of the foot.   We have performed a few treatments of extracorporeal shockwave therapy which has been helpful for the plantar fasciitis.  Her right is very minimal, the left side is better but still bothers her some.  She does continue on her orthotics.  She did mention this at last visit as well as today that she is dealing with a cramping-like sensation over the dorsum of the foot that is worse at nighttime.  She has not tried a compression nor sock to keep the foot slightly warm at nighttime, but did start the 8 ounces of tart cherry concentrate since last visit.  Does have lab work upcoming with her PCP this month.  She does use gabapentin 100 mg a.m., 200 mg nightly for nerve related pain.  Pertinent ROS were reviewed with the patient and found to be negative unless otherwise specified above in HPI.   Assessment & Plan: Visit Diagnoses:  1. Plantar fasciitis, bilateral   2. Bilateral foot pain   3. Neuropathy of left lower extremity   4. Vitamin deficiency    Plan: Astha is making improvements with extracorporeal shockwave therapy for her plantar fasciitis and heel pain.  She has gotten better relief 1 week perform these close together about 1 to 2 weeks apart.  We did repeat ESWT today.  She may continue her gabapentin 100 mg a.m., 200 mg nightly for her nerve related pain.  I did discuss if her cramping sensation is not improving, ice obtain lab work evaluation to rule out causes of neurogenic neuropathy.  Would plan for  ferritin, vitamin B6, as well as vitamin D.  She does have blood work upcoming with her PCP, I will add these to outpatient orders and hopefully she can get these done together to minimize blood sticks.  She will check with her primary team to schedule her blood work.  She will follow-up with me in about 1-2 weeks for reevaluation and ESWT.  Follow-up: Return for f/u in 1-2 weeks for b/l heels .   Meds & Orders: No orders of the defined types were placed in this encounter.   Orders Placed This Encounter  Procedures   Ferritin   Vitamin B6   Vitamin D (25 hydroxy)     Procedures: Procedure: ECSWT Indications:  Plantar fasciitis   Procedure Details Consent: Risks of procedure as well as the alternatives and risks of each were explained to the patient.  Verbal consent for procedure obtained. Time Out: Verified patient identification, verified procedure, site was marked, verified correct patient position. The area was cleaned with alcohol swab.     The left plantar fascia and foot arch was targeted for Extracorporeal shockwave therapy.    Preset: Plantar fasciitis Power Level: 90 mJ  Frequency: 11 Hz Impulse/cycles: 2500 Head size: Regular   Patient tolerated procedure well without immediate complications.   The right plantar fascia and foot arch was targeted for Extracorporeal shockwave therapy.    Preset: Plantar fasciitis Power Level: 90  mJ Frequency: 11 Hz Impulse/cycles: 1800 Head size: Regular   Patient tolerated procedure well without immediate complications.       Clinical History: No specialty comments available.  She reports that she has never smoked. She has never used smokeless tobacco. No results for input(s): "HGBA1C", "LABURIC" in the last 8760 hours.  Objective:   Vital Signs: LMP 11/04/1990   Physical Exam  Gen: Well-appearing, in no acute distress; non-toxic CV: Well-perfused. Warm.  Resp: Breathing unlabored on room air; no wheezing. Psych: Fluid  speech in conversation; appropriate affect; normal thought process Neuro: Sensation intact throughout. No gross coordination deficits.   Ortho Exam - Bilateral feet: Moderate loss of transverse arch bilaterally, although well-preserved longitudinal arch.  There is hammertoe deformities of the second and third toe on the right greater than left.  Very mild TTP over the plantar fascia insertion on the left foot, none on the right.  No redness swelling or effusion.  Imaging: No results found.  Past Medical/Family/Surgical/Social History: Medications & Allergies reviewed per EMR, new medications updated. Patient Active Problem List   Diagnosis Date Noted   Complete heart block (HCC) 09/03/2022   Hot flashes 08/03/2021   Spinal stenosis of lumbar region 06/11/2019   Bilateral carpal tunnel syndrome 03/18/2019   INOCA 12/22/2017   Abnormality of gait 01/25/2016   Left foot pain 01/01/2016   Foot pain, right 01/01/2016   Callus of foot 01/01/2016   Headache, migraine 02/10/2014   Osteoporosis    DI (detrusor instability)    Fibroid    Menopausal symptoms    Non-Hodgkin lymphoma (HCC)    Past Medical History:  Diagnosis Date   Anginal pain (HCC)    Bursitis    left hip    Chronic constipation    DI (detrusor instability)    Femur fracture (HCC) 2011   Fibroid    Headache    Heart block AV third degree (HCC)    Menopausal symptoms    Non-Hodgkin lymphoma (HCC) 1992   treated with CHOP   Osteoporosis    h/o 3 stress fractures   Tennis elbow Right    Trigger finger    bilateral    Family History  Problem Relation Age of Onset   Diabetes Mother 51   Hypertension Mother    Alzheimer's disease Mother    Glaucoma Mother    Heart disease Mother    Hypertension Father    Alcoholism Father        deceased age 54   Cirrhosis Father    Diabetes Brother    Hypertension Brother    Colon cancer Maternal Uncle    Heart disease Maternal Grandmother    Breast cancer Cousin         Mat. 1st cousin-Age 91   Breast cancer Maternal Aunt        Age 37's   Heart disease Maternal Aunt    Other Paternal Grandmother        MI   Past Surgical History:  Procedure Laterality Date   Biopsy for Non Hodgkins Lymphoma  1992   CARPAL TUNNEL RELEASE Right 09/11/2019   Procedure: CARPAL TUNNEL RELEASE;  Surgeon: Betha Loa, MD;  Location: [facility];  Service: Orthopedics;  Laterality: Right;   DILATION AND CURETTAGE OF UTERUS     PACEMAKER IMPLANT N/A 09/03/2022   Procedure: PACEMAKER IMPLANT;  Surgeon: Marinus Maw, MD;  [facility]: [facility];  Service: Cardiovascular;  Laterality: N/A;   PELVIC LAPAROSCOPY  TOE SURGERY     TRIGGER FINGER RELEASE Left 08/04/2018   Procedure: RELEASE TRIGGER FINGER/A-1 PULLEY;  Surgeon: Betha Loa, MD;  Location: [facility];  Service: Orthopedics;  Laterality: Left;   Social History   Occupational History    Employer: [employer]    Comment: Ass. Dean and Professor  Tobacco Use   Smoking status: Never   Smokeless tobacco: Never  Vaping Use   Vaping status: Never Used  Substance and Sexual Activity   Alcohol use: Yes    Comment: social   Drug use: No   Sexual activity: Not Currently    Partners: Male    Birth control/protection: Post-menopausal

## 2023-02-14 DIAGNOSIS — E785 Hyperlipidemia, unspecified: Secondary | ICD-10-CM | POA: Diagnosis not present

## 2023-02-14 DIAGNOSIS — R200 Anesthesia of skin: Secondary | ICD-10-CM | POA: Diagnosis not present

## 2023-02-14 DIAGNOSIS — R7989 Other specified abnormal findings of blood chemistry: Secondary | ICD-10-CM | POA: Diagnosis not present

## 2023-02-14 DIAGNOSIS — M810 Age-related osteoporosis without current pathological fracture: Secondary | ICD-10-CM | POA: Diagnosis not present

## 2023-02-25 ENCOUNTER — Ambulatory Visit: Payer: Medicare PPO | Admitting: Sports Medicine

## 2023-03-05 ENCOUNTER — Ambulatory Visit (INDEPENDENT_AMBULATORY_CARE_PROVIDER_SITE_OTHER): Payer: Medicare PPO

## 2023-03-05 DIAGNOSIS — I442 Atrioventricular block, complete: Secondary | ICD-10-CM

## 2023-03-05 LAB — CUP PACEART REMOTE DEVICE CHECK
Battery Remaining Longevity: 144 mo
Battery Voltage: 3.13 V
Brady Statistic AP VP Percent: 22.59 %
Brady Statistic AP VS Percent: 0 %
Brady Statistic AS VP Percent: 77.31 %
Brady Statistic AS VS Percent: 0.1 %
Brady Statistic RA Percent Paced: 22.42 %
Brady Statistic RV Percent Paced: 99.89 %
Date Time Interrogation Session: 20241230201523
Implantable Lead Connection Status: 753985
Implantable Lead Connection Status: 753985
Implantable Lead Implant Date: 20240701
Implantable Lead Implant Date: 20240701
Implantable Lead Location: 753859
Implantable Lead Location: 753860
Implantable Lead Model: 3830
Implantable Lead Model: 5076
Implantable Pulse Generator Implant Date: 20240701
Lead Channel Impedance Value: 342 Ohm
Lead Channel Impedance Value: 361 Ohm
Lead Channel Impedance Value: 475 Ohm
Lead Channel Impedance Value: 494 Ohm
Lead Channel Pacing Threshold Amplitude: 0.375 V
Lead Channel Pacing Threshold Amplitude: 0.625 V
Lead Channel Pacing Threshold Pulse Width: 0.4 ms
Lead Channel Pacing Threshold Pulse Width: 0.4 ms
Lead Channel Sensing Intrinsic Amplitude: 24.875 mV
Lead Channel Sensing Intrinsic Amplitude: 24.875 mV
Lead Channel Sensing Intrinsic Amplitude: 5.125 mV
Lead Channel Sensing Intrinsic Amplitude: 5.125 mV
Lead Channel Setting Pacing Amplitude: 1.5 V
Lead Channel Setting Pacing Amplitude: 2 V
Lead Channel Setting Pacing Pulse Width: 0.4 ms
Lead Channel Setting Sensing Sensitivity: 1.2 mV
Zone Setting Status: 755011

## 2023-03-07 DIAGNOSIS — H401232 Low-tension glaucoma, bilateral, moderate stage: Secondary | ICD-10-CM | POA: Diagnosis not present

## 2023-03-12 ENCOUNTER — Ambulatory Visit: Payer: Medicare PPO | Admitting: Sports Medicine

## 2023-03-12 ENCOUNTER — Encounter: Payer: Self-pay | Admitting: Sports Medicine

## 2023-03-12 DIAGNOSIS — G8929 Other chronic pain: Secondary | ICD-10-CM

## 2023-03-12 DIAGNOSIS — M79672 Pain in left foot: Secondary | ICD-10-CM

## 2023-03-12 DIAGNOSIS — G5792 Unspecified mononeuropathy of left lower limb: Secondary | ICD-10-CM

## 2023-03-12 DIAGNOSIS — M722 Plantar fascial fibromatosis: Secondary | ICD-10-CM

## 2023-03-12 NOTE — Progress Notes (Signed)
 Patient says that she is feeling alright although she feels that she has plateaued. She says that she missed an appointment on 12/23 due to accidentally cancelling it, but did not notice a difference with the time off from the treatment. She went on an 8 mile hike this morning and did not have pain.

## 2023-03-12 NOTE — Progress Notes (Signed)
 Deborah Fisher - 73 y.o. female MRN [id number]  Date of birth: March 29, 1950  Office Visit Note: Visit Date: 03/12/2023 PCP: Loreli Elsie JONETTA Mickey., MD Referred by: Loreli Elsie JONETTA Mickey., MD  Subjective: Chief Complaint  Patient presents with   Right Foot - Follow-up   Left Foot - Follow-up   HPI: Deborah Fisher is a pleasant 73 y.o. female who presents today for follow-up of bilateral heel pain with plantar fascia syndrome.  We have performed 4 treatments of extracorporeal shock wave therapy for her bilateral heel/plantar fascia, she has been making improvements with this and is at the point she can continue hiking and walking without disturbance of her pain.  She still gets some pain more so on the left heel and arch of the foot compared to the right.  Even though she has had improvement with shockwave, the last treatment or 2 has somewhat plateaued in terms of further benefit.  She does continue in her orthotics from [facility].  She will continue with infrequent cramping-like sensation over the dorsum of the left foot as well, this is worse at nighttime.  She has not been as consistent with wearing socks/compression order tart cherry concentrate.  She does use gabapentin  100 mg a.m., 200 mg nightly for nerve related pain.   *Independently reviewed labs from Dr. Zell Loreli office x 3 weeks ago, As below: -Ferritin: 191 -Vitamin B6: 82 (nml-high) -Vitamin D (25-OH): 68.8  Pertinent ROS were reviewed with the patient and found to be negative unless otherwise specified above in HPI.   Assessment & Plan: Visit Diagnoses:  1. Plantar fasciitis, bilateral   2. Chronic heel pain, left   3. Neuropathy of left lower extremity    Plan: Impression is bilateral heel pain L > R, which does seem fitting with plantar fasciitis.  She has made some improvements with extracorporeal shockwave therapy but has plateaued here recently.  Given the chronicity of her symptoms, I think it is pertinent to obtain an MRI  of the left foot to evaluate the underlying calcaneus, plantar fascia and other soft tissue structures we cannot evaluate on x-ray to help guide her management.  Did discuss the role of a one-time plantar fascia injection under ultrasound guidance, but will hold until MRI returns.  She may continue her gabapentin  100 mg a.m., 200 mg nightly for her nerve related pain.  She does have some cramping/neuropathy like symptoms of the left lower extremity was seen indicative of neurogenic neuropathy.  Her vitamin D and ferritin levels are within normal limits.  She will get back to wearing a double sock to increase warmth as well as tart cherry concentrate 8 ounces daily.  We will follow-up 1 week after MRI to review and discuss next steps.  Low threshold, but could consider ABI studies of the lower extremity  Follow-up: Return in about 1 week (around 03/19/2023) for after MRI appt (please make 30-min appt).   Meds & Orders: No orders of the defined types were placed in this encounter.   Orders Placed This Encounter  Procedures   MR Foot Left w/o contrast     Procedures: No procedures performed      Clinical History: No specialty comments available.  She reports that she has never smoked. She has never used smokeless tobacco. No results for input(s): HGBA1C, LABURIC in the last 8760 hours.  Objective:   Vital Signs: LMP 11/04/1990   Physical Exam  Gen: Well-appearing, in no acute distress; non-toxic CV: Well-perfused.  Warm.  Resp: Breathing unlabored on room air; no wheezing. Psych: Fluid speech in conversation; appropriate affect; normal thought process  Ortho Exam - Bilateral feet: Moderate loss of transverse arch bilaterally, relatively well-preserved longitudinal arch.  There are hammertoe deformities of the second and third toe on the right greater than left.  There is some tenderness to palpation over the plantar fascia insertion on the medial heel of the left, the right side is  nontender today.  Imaging: No results found.  Past Medical/Family/Surgical/Social History: Medications & Allergies reviewed per EMR, new medications updated. Patient Active Problem List   Diagnosis Date Noted   Complete heart block (HCC) 09/03/2022   Hot flashes 08/03/2021   Spinal stenosis of lumbar region 06/11/2019   Bilateral carpal tunnel syndrome 03/18/2019   INOCA 12/22/2017   Abnormality of gait 01/25/2016   Left foot pain 01/01/2016   Foot pain, right 01/01/2016   Callus of foot 01/01/2016   Headache, migraine 02/10/2014   Osteoporosis    DI (detrusor instability)    Fibroid    Menopausal symptoms    Non-Hodgkin lymphoma (HCC)    Past Medical History:  Diagnosis Date   Anginal pain (HCC)    Bursitis    left hip    Chronic constipation    DI (detrusor instability)    Femur fracture (HCC) 2011   Fibroid    Headache    Heart block AV third degree (HCC)    Menopausal symptoms    Non-Hodgkin lymphoma (HCC) 1992   treated with CHOP   Osteoporosis    h/o 3 stress fractures   Tennis elbow Right    Trigger finger    bilateral    Family History  Problem Relation Age of Onset   Diabetes Mother 36   Hypertension Mother    Alzheimer's disease Mother    Glaucoma Mother    Heart disease Mother    Hypertension Father    Alcoholism Father        deceased age 61   Cirrhosis Father    Diabetes Brother    Hypertension Brother    Colon cancer Maternal Uncle    Heart disease Maternal Grandmother    Breast cancer Cousin        Mat. 1st cousin-Age 40   Breast cancer Maternal Aunt        Age 53's   Heart disease Maternal Aunt    Other Paternal Grandmother        MI   Past Surgical History:  Procedure Laterality Date   Biopsy for Non Hodgkins Lymphoma  1992   CARPAL TUNNEL RELEASE Right 09/11/2019   Procedure: CARPAL TUNNEL RELEASE;  Surgeon: Murrell Drivers, MD;  Location: [facility];  Service: Orthopedics;  Laterality: Right;   DILATION AND  CURETTAGE OF UTERUS     PACEMAKER IMPLANT N/A 09/03/2022   Procedure: PACEMAKER IMPLANT;  Surgeon: Waddell Danelle ORN, MD;  Location: [facility];  Service: Cardiovascular;  Laterality: N/A;   PELVIC LAPAROSCOPY     TOE SURGERY     TRIGGER FINGER RELEASE Left 08/04/2018   Procedure: RELEASE TRIGGER FINGER/A-1 PULLEY;  Surgeon: Murrell Drivers, MD;  Location: [facility];  Service: Orthopedics;  Laterality: Left;   Social History   Occupational History    Employer: [employer]    Comment: Ass. Dean and Professor  Tobacco Use   Smoking status: Never   Smokeless tobacco: Never  Vaping Use   Vaping status: Never Used  Substance and Sexual Activity   Alcohol  use: Yes    Comment: social   Drug use: No   Sexual activity: Not Currently    Partners: Male    Birth control/protection: Post-menopausal

## 2023-03-13 ENCOUNTER — Encounter: Payer: Self-pay | Admitting: Internal Medicine

## 2023-03-21 DIAGNOSIS — D2271 Melanocytic nevi of right lower limb, including hip: Secondary | ICD-10-CM | POA: Diagnosis not present

## 2023-03-21 DIAGNOSIS — L821 Other seborrheic keratosis: Secondary | ICD-10-CM | POA: Diagnosis not present

## 2023-03-21 DIAGNOSIS — D2261 Melanocytic nevi of right upper limb, including shoulder: Secondary | ICD-10-CM | POA: Diagnosis not present

## 2023-04-05 DIAGNOSIS — M542 Cervicalgia: Secondary | ICD-10-CM | POA: Diagnosis not present

## 2023-04-05 DIAGNOSIS — M7918 Myalgia, other site: Secondary | ICD-10-CM | POA: Diagnosis not present

## 2023-04-11 ENCOUNTER — Encounter: Payer: Self-pay | Admitting: Sports Medicine

## 2023-04-11 ENCOUNTER — Ambulatory Visit: Payer: Medicare PPO | Admitting: Sports Medicine

## 2023-04-11 ENCOUNTER — Other Ambulatory Visit: Payer: Self-pay

## 2023-04-11 VITALS — BP 122/86 | Ht 65.0 in | Wt 158.0 lb

## 2023-04-11 DIAGNOSIS — G8929 Other chronic pain: Secondary | ICD-10-CM

## 2023-04-11 DIAGNOSIS — M25572 Pain in left ankle and joints of left foot: Secondary | ICD-10-CM

## 2023-04-11 DIAGNOSIS — M79672 Pain in left foot: Secondary | ICD-10-CM

## 2023-04-11 NOTE — Progress Notes (Signed)
 PCP: Loreli Elsie JONETTA Mickey., MD  SUBJECTIVE:   HPI:  Patient is a 73 y.o. female here with chief complaint of left heel pain. She has followed with Dr. Burnetta at [facility] for chronic pain in her heels bilaterally, L>R secondary to plantar fasciitis. She was sent over for possible updated custom orthotics.   Deborah Fisher has been dealing with pain in both heels for many months. She is an avid hiker and had a lot of trouble with heel pain in the later fall secondary to her plantar fasciitis. I have reviewed her visits with Dr. Burnetta and she has underwent 4 sessions of extracorporeal shockwave therapy. She notes she had great improvement with those sessions though started to notice a plateau of benefit. She has historically managed this with orthotics, though reports her current pair made back in 2022 were too stiff for her so she didn't wear them until the past 3-4 months. She doesn't feel they are too worn down.  She has been back to hiking 3-4 times weekly typically 5+ miles each hike without much difficulty. Occasionally will have pain in the heel while hiking but notes this is mild and only lasts a minute or so. Is not getting much pain with first step in the morning or after periods of inactivity. The right heel doesn't really bother her, but the left heel will ache intermittently even without weight bearing. She also notes some cramping pain in the dorsum of the foot, worse in the evenings. She has started the tart cherry concentrate at Dr. Burnetta request this week.   She is also on Gabapentin  300mg  qAM and 600mg  at bedtime for spinal stenosis and radiculopathy that has been fairly well controlled. Reports she was previously on higher dosages, though has been trying to come down on her medications. She hasn't noticed a relationship between the dropping of her Gabapentin  and worsening of her heel pain.  Pertinent ROS were reviewed with the patient and found to be negative unless otherwise specified  above in HPI.   PERTINENT  PMH / PSH / FH / SH:  Past Medical, Surgical, Social, and Family History Reviewed & Updated in the EMR.  Pertinent findings include:  Plantar Fasciitis, bilateral Non-Hodgkin Lymphoma tx w/ CHOP Osteoporosis  Allergies  Allergen Reactions   Iodinated Contrast Media Other (See Comments)     Warm feeling once   Tape Rash    OBJECTIVE:  BP 122/86   Ht 5' 5 (1.651 m)   Wt 158 lb (71.7 kg)   LMP 11/04/1990   BMI 26.29 kg/m   PHYSICAL EXAM:  GEN: Alert and Oriented, NAD, comfortable in exam room RESP: Unlabored respirations, symmetric chest rise PSY: normal mood, congruent affect   MSK EXAM: Bilateral feet with well preserved longitudinal arches. Transverse arches with mild breakdown and collapse of the plantar plate. She is TTP over the medial origin of the left plantar fascia and proximal plantar fascia. Right calcaneus and plantar fascia non-tender. No TTP along medial hindfoot or midfoot.  Limited MSK Ultrasound of Bilateral Heels: Left plantar fascia is thickened at 0.59cm with some hypoechoic changes, though no significant calcifications present in the tissue.  Right plantar fascia is normal thickness at 0.4cm with no significant hypoechoic changes. She does have a small calcific spur at it's origin of the calcaneus.  Left tibial nerve visualized into it's branches distally. There is some calcifications around the Baxter's nerve.  Impression: Left Plantar Fasciitis and Baxter's Neuropathy. Right Plantar Fascia appears improved radiographically.  U/S performed and interpreted by Prentice Niece, MD with supervision of Ophelia Haddock, MD.  Assessment & Plan Chronic heel pain, left Agree with diagnosis of plantar fasciitis bilaterally, with some persistent ultrasound thickening and abnormalities of the left. She also has some findings on ultrasound that indicate contribution of Baxter's Neuropathy. This would explain why she is getting pain even at  rest without stress to the PF. I think her current orthotics are functioning well and should continue to serve her well for the foreseeable future. I agree with the recommendations Dr. Burnetta suggested with tart cherry concentrate and we did provide [facility] a body helix ankle/heel compression wrap in effort to maintain warmth around the branches of the tibial nerve. Also recommended increasing her Gabapentin  to 600mg  BID to see if this will help the neuropathy. Recommended consideration of continuing Shockwave therapy, especially if she is starting to develop pain in the heel with weight bearing or hiking again. Will have the patient follow back up with Dr. Burnetta, however happy to see her back if we can be of any further assistance.  Prentice Niece, MD PGY-4, Sports Medicine Fellow [facility]  I observed and examined the patient with the [facility] resident and agree with assessment and plan.  Note reviewed and modified by me. PATRICE Haddock, MD

## 2023-04-11 NOTE — Patient Instructions (Addendum)
 You were seen for your left heel pain. Based on the findings with your ultrasound this is because of a combination of Plantar Fasciitis and a condition called Baxter's Neuropathy. I think your current orthotics are still supportive and not yet ready for needing updating. For the neuropathy we are increasing your Gabapentin  to two tablets in the morning and two tablets at night. We also got a compression sleeve for the left heel/ankle which may help. Consider more shockwave sessions with Dr. Burnetta if this is persisting.

## 2023-04-15 DIAGNOSIS — L6612 Frontal fibrosing alopecia: Secondary | ICD-10-CM | POA: Diagnosis not present

## 2023-04-15 DIAGNOSIS — L6681 Central centrifugal cicatricial alopecia: Secondary | ICD-10-CM | POA: Diagnosis not present

## 2023-04-16 NOTE — Progress Notes (Signed)
Remote pacemaker transmission.

## 2023-04-23 DIAGNOSIS — M542 Cervicalgia: Secondary | ICD-10-CM | POA: Diagnosis not present

## 2023-04-24 ENCOUNTER — Encounter: Payer: Self-pay | Admitting: Internal Medicine

## 2023-04-25 ENCOUNTER — Encounter: Payer: Self-pay | Admitting: Sports Medicine

## 2023-04-25 ENCOUNTER — Encounter: Payer: Self-pay | Admitting: Internal Medicine

## 2023-04-29 ENCOUNTER — Encounter: Payer: Self-pay | Admitting: Sports Medicine

## 2023-04-30 ENCOUNTER — Other Ambulatory Visit: Payer: Self-pay

## 2023-04-30 DIAGNOSIS — M4800 Spinal stenosis, site unspecified: Secondary | ICD-10-CM

## 2023-04-30 MED ORDER — GABAPENTIN 300 MG PO CAPS
600.0000 mg | ORAL_CAPSULE | Freq: Two times a day (BID) | ORAL | 2 refills | Status: DC
Start: 2023-04-30 — End: 2024-01-06

## 2023-05-14 ENCOUNTER — Ambulatory Visit (HOSPITAL_COMMUNITY)
Admission: RE | Admit: 2023-05-14 | Discharge: 2023-05-14 | Disposition: A | Discharge status: Home / Self Care | Payer: Medicare PPO | Source: Ambulatory Visit | Attending: Sports Medicine | Admitting: Sports Medicine

## 2023-05-14 DIAGNOSIS — R7301 Impaired fasting glucose: Secondary | ICD-10-CM | POA: Diagnosis not present

## 2023-05-14 DIAGNOSIS — M722 Plantar fascial fibromatosis: Secondary | ICD-10-CM | POA: Insufficient documentation

## 2023-05-14 DIAGNOSIS — M25472 Effusion, left ankle: Secondary | ICD-10-CM | POA: Diagnosis not present

## 2023-05-14 DIAGNOSIS — Q6672 Congenital pes cavus, left foot: Secondary | ICD-10-CM | POA: Diagnosis not present

## 2023-05-14 DIAGNOSIS — E785 Hyperlipidemia, unspecified: Secondary | ICD-10-CM | POA: Diagnosis not present

## 2023-05-14 DIAGNOSIS — G8929 Other chronic pain: Secondary | ICD-10-CM | POA: Diagnosis not present

## 2023-05-14 DIAGNOSIS — M48061 Spinal stenosis, lumbar region without neurogenic claudication: Secondary | ICD-10-CM | POA: Diagnosis not present

## 2023-05-14 DIAGNOSIS — M79672 Pain in left foot: Secondary | ICD-10-CM | POA: Diagnosis not present

## 2023-05-14 DIAGNOSIS — I201 Angina pectoris with documented spasm: Secondary | ICD-10-CM | POA: Diagnosis not present

## 2023-05-14 DIAGNOSIS — M791 Myalgia, unspecified site: Secondary | ICD-10-CM | POA: Diagnosis not present

## 2023-05-14 DIAGNOSIS — M19072 Primary osteoarthritis, left ankle and foot: Secondary | ICD-10-CM | POA: Diagnosis not present

## 2023-05-14 DIAGNOSIS — E663 Overweight: Secondary | ICD-10-CM | POA: Diagnosis not present

## 2023-05-16 DIAGNOSIS — M79604 Pain in right leg: Secondary | ICD-10-CM | POA: Diagnosis not present

## 2023-05-16 DIAGNOSIS — M79605 Pain in left leg: Secondary | ICD-10-CM | POA: Diagnosis not present

## 2023-05-16 DIAGNOSIS — E785 Hyperlipidemia, unspecified: Secondary | ICD-10-CM | POA: Diagnosis not present

## 2023-05-16 DIAGNOSIS — M48061 Spinal stenosis, lumbar region without neurogenic claudication: Secondary | ICD-10-CM | POA: Diagnosis not present

## 2023-05-17 ENCOUNTER — Telehealth: Payer: Self-pay | Admitting: Internal Medicine

## 2023-05-17 ENCOUNTER — Encounter: Payer: Self-pay | Admitting: Internal Medicine

## 2023-05-17 NOTE — Telephone Encounter (Signed)
 Patient stated she has developed pain in her lower legs after hiking and has happened while she has been at rest and is concerned this has been getting worse.  Patient stated there is no swelling.  Patient notes her PCP has placed order for ABI test to rule out PAD.  Patient has appointment scheduled on 3/21.

## 2023-05-17 NOTE — Telephone Encounter (Signed)
 Error

## 2023-05-17 NOTE — Telephone Encounter (Signed)
 Spoke with pt, she reports she went off the statin for about 3 weeks and there was no change in her symptoms so she has restarted that medication. She is having ABI's on Monday with [facility]. Patient made aware we would be able to see those results. Patient asking if she needs to keep the appointment, explained to patient that it would depend on the results. Without the results not sure what treatment would be needed.

## 2023-05-20 ENCOUNTER — Other Ambulatory Visit (HOSPITAL_COMMUNITY): Payer: Self-pay | Admitting: Internal Medicine

## 2023-05-20 ENCOUNTER — Ambulatory Visit (HOSPITAL_COMMUNITY)
Admission: RE | Admit: 2023-05-20 | Discharge: 2023-05-20 | Disposition: A | Discharge status: Home / Self Care | Source: Ambulatory Visit | Attending: Surgery | Admitting: Surgery

## 2023-05-20 DIAGNOSIS — M79605 Pain in left leg: Secondary | ICD-10-CM | POA: Insufficient documentation

## 2023-05-20 DIAGNOSIS — M79604 Pain in right leg: Secondary | ICD-10-CM | POA: Insufficient documentation

## 2023-05-20 LAB — VAS US ABI WITH/WO TBI
Left ABI: 1.25
Right ABI: 1.29

## 2023-05-21 ENCOUNTER — Encounter: Payer: Self-pay | Admitting: Sports Medicine

## 2023-05-21 ENCOUNTER — Ambulatory Visit: Admitting: Sports Medicine

## 2023-05-21 DIAGNOSIS — M79605 Pain in left leg: Secondary | ICD-10-CM

## 2023-05-21 DIAGNOSIS — M722 Plantar fascial fibromatosis: Secondary | ICD-10-CM

## 2023-05-21 DIAGNOSIS — M79604 Pain in right leg: Secondary | ICD-10-CM

## 2023-05-21 DIAGNOSIS — M216X1 Other acquired deformities of right foot: Secondary | ICD-10-CM

## 2023-05-21 DIAGNOSIS — M216X2 Other acquired deformities of left foot: Secondary | ICD-10-CM | POA: Diagnosis not present

## 2023-05-21 MED ORDER — NITROGLYCERIN 0.2 MG/HR TD PT24: MEDICATED_PATCH | TRANSDERMAL | 0 refills | Status: DC

## 2023-05-21 NOTE — Progress Notes (Signed)
 Patient is here for MRI review. She says that she saw Dr. Darrick Penna about new orthotics, but it was not recommended that she get new orthotics.  Patient also mentions seeing her PCP about lower leg pain that has gotten progressively worse over the last few months. She says that it is around the entire lower leg between the ankles and the knees, but does not involve the ankles or the knees. She says that it is more "on" than "off," and is especially painful during her hikes when she is climbing up hill.

## 2023-05-21 NOTE — Progress Notes (Signed)
 UNNAMED HINO - 73 y.o. female MRN [id number]  Date of birth: 1950/03/17  Office Visit Note: Visit Date: 05/21/2023 PCP: Cleatis Polka., MD Referred by: Cleatis Polka., MD  Subjective: Chief Complaint  Patient presents with   Left Foot - Follow-up   HPI: Deborah Fisher is a pleasant 73 y.o. female who presents today for follow-up of chronic left foot and heel pain, MRI review; also with bilateral leg pain.  She saw Dr. Darrick Penna and company in February -had mild increase of thickness around the plantar fascia.  She they also increased her gabapentin to 600 mg twice daily and recommended an ankle compression sleeve.  She does not feel she has made much progress with increase gabapentin.  She continues hiking and has some pain in the heel/plantar fascia but this is not preventing her from being active.  She also mentions having lower leg pain that starts just below the knees and goes down just above the ankles bilaterally.  This is not present all the time but with certain hikes she will have an achy is over the medial side but wraps around the entire leg.  This does not go into the toes nor above her knees or coming from her back.  She denies any numbness or tingling present.  She did have this evaluated by her PCP and did have ABI studies which did not show any evidence of peripheral arterial disease.  Pertinent ROS were reviewed with the patient and found to be negative unless otherwise specified above in HPI.   Assessment & Plan: Visit Diagnoses:  1. Plantar fasciitis of left foot   2. Bilateral leg pain   3. Loss of transverse plantar arch of right foot   4. Loss of transverse plantar arch of left foot    Plan: Impression is chronic left heel pain with both plantar fascia thickening as well as concomitant plantar fasciitis which is redemonstrated on her MRI.  She has received relief in the past from extracorporeal shockwave therapy although had mild plateau of the last 2  treatments back in Dec/January.  We discussed all treatment options such as repeating shockwave for trialing nitroglycerin patch protocol.  I would like her to trial nitroglycerin patch 0.2 mg/h changing once daily.  She does have an allergy to surgical tape with localized reaction, I would like her to check with the pharmacist to see if this cross laps with the nitroglycerin patch.  We will see her next week to see relief and 2 treatments of extracorporeal shockwave therapy to see if she starts receiving cumulative benefit.  She may continue her gabapentin 600 mg twice daily, however if she continues to this and is not finding benefit we may work on tapering off or discontinuing.  I did have her do some research on medial tibial stress syndrome to see if this did fit her symptoms, we will plan on working this up in the future starting with x-rays or ultrasound.  Follow-up: Return in about 1 week (around 05/28/2023) for for plantar fascia.   Meds & Orders:  Meds ordered this encounter  Medications   nitroGLYCERIN (NITRODUR - DOSED IN MG/24 HR) 0.2 mg/hr patch    Sig: Cut patch into fourths. Apply 1/4 patch to affected area on heel. Change once daily.    Dispense:  20 patch    Refill:  0   No orders of the defined types were placed in this encounter.    Procedures: No procedures  performed      Clinical History: No specialty comments available.  She reports that she has never smoked. She has never used smokeless tobacco. No results for input(s): "HGBA1C", "LABURIC" in the last 8760 hours.  Objective:   Vital Signs: LMP 11/04/1990   Physical Exam  Gen: Well-appearing, in no acute distress; non-toxic CV: Well-perfused. Warm.  Resp: Breathing unlabored on room air; no wheezing. Psych: Fluid speech in conversation; appropriate affect; normal thought process  Ortho Exam - Left foot: + TTP over the medial calcaneus right at the plantar fascia insertion.  No redness swelling or effusion  here.  - Bilateral legs: There is no skin changes or swelling about the legs.  There is generalized tenderness down the medial tibia from the proximal to distal tibia without any tenderness at the knee nor the ankle.  There are palpable pulses at PT.  Imaging:  VAS [place] ABI WITH/WO TBI  LOWER EXTREMITY DOPPLER STUDY  Patient Name:  Deborah Fisher  Date of Exam:   05/20/2023 Medical Rec #: [id number]        Accession #:    [id number] Date of Birth: [birth year]         Patient Gender: F Patient Age:   44 years Exam Location:  [facility] Procedure:      VAS [place] ABI WITH/WO TBI Referring Phys: Martha Clan  --------------------------------------------------------------------------------   Indications: Bilateral leg pain.  High Risk Factors: Hyperlipidemia, no history of smoking.  Other Factors: Patient hikes several miles several times per week. Has noticed                pain in bilateral legs that will persist into the next day.  Performing Technologist: Thereasa Parkin RVT    Examination Guidelines: A complete evaluation includes at minimum, Doppler waveform signals and systolic blood pressure reading at the level of bilateral brachial, anterior tibial, and posterior tibial arteries, when vessel segments are accessible. Bilateral testing is considered an integral part of a complete examination. Photoelectric Plethysmograph (PPG) waveforms and toe systolic pressure readings are included as required and additional duplex testing as needed. Limited examinations for reoccurring indications may be performed as noted.    ABI Findings: +---------+------------------+-----+---------+--------+ Right    Rt Pressure (mmHg)IndexWaveform Comment  +---------+------------------+-----+---------+--------+ Brachial 124                                      +---------+------------------+-----+---------+--------+ ATA      164               1.29 triphasic          +---------+------------------+-----+---------+--------+ PTA      149               1.17 triphasic         +---------+------------------+-----+---------+--------+ Great Toe78                0.61                   +---------+------------------+-----+---------+--------+  +---------+------------------+-----+---------+-------+ Left     Lt Pressure (mmHg)IndexWaveform Comment +---------+------------------+-----+---------+-------+ Brachial 127                                     +---------+------------------+-----+---------+-------+ ATA      153  1.20 triphasic        +---------+------------------+-----+---------+-------+ PTA      159               1.25 triphasic        +---------+------------------+-----+---------+-------+ Great Toe100               0.79                  +---------+------------------+-----+---------+-------+  +-------+-----------+-----------+------------+------------+ ABI/TBIToday's ABIToday's TBIPrevious ABIPrevious TBI +-------+-----------+-----------+------------+------------+ Right  1.29       0.61                                +-------+-----------+-----------+------------+------------+ Left   1.25       0.79                                +-------+-----------+-----------+------------+------------+     No previous ABI.   Summary: Right: Resting right ankle-brachial index is within normal range. The right toe-brachial index is abnormal.  Left: Resting left ankle-brachial index is within normal range. The left toe-brachial index is normal.  *See table(s) above for measurements and observations.    Electronically signed by Coral Else MD on 05/20/2023 at 3:05:15 PM.      Final   MR Foot Left w/o contrast CLINICAL DATA:  Chronic left heel pain.  EXAM: MRI OF THE LEFT FOOT WITHOUT CONTRAST  TECHNIQUE: Multiplanar, multisequence MR imaging of the ankle was performed. No intravenous  contrast was administered.  COMPARISON:  Radiographs 12/31/2022  FINDINGS: TENDONS  Peroneal: Normal  Posteromedial: Normal  Anterior: Normal  Achilles: Normal  Plantar Fascia: Mild diffuse thickening of the plantar fascia without nodularity to suggest fibromatosis. Minimal interstitial changes near the attachment site on the calcaneus suggesting minimal/mild plantar fasciitis. No associated inflammation/edema in the calcaneus and no evidence for a plantar fascia tear/rupture.  LIGAMENTS  Lateral: Intact  Medial: Intact  CARTILAGE  Ankle Joint: Mild degenerative chondrosis with mild cartilage thinning and mild joint space narrowing. No full-thickness chondral defect or osteochondral lesion. Small joint effusion.  Subtalar Joints/Sinus Tarsi: The subtalar joints are maintained. The sinus tarsi is normal. The cervical and interosseous ligaments are intact. The spring ligament is grossly intact.  Bones: Mild pes cavus. No stress fracture or bone lesion involving the ankle, midfoot or hindfoot. No significant midfoot degenerative changes.  Other: The ankle and foot musculature are unremarkable.  IMPRESSION: 1. Mild diffuse thickening of the plantar fascia without nodularity to suggest fibromatosis. Minimal interstitial changes near the attachment site on the calcaneus suggesting minimal/mild plantar fasciitis. No associated inflammation/edema in the calcaneus and no evidence for a plantar fascia tear/rupture. 2. Intact medial and lateral ankle ligaments and tendons. 3. Mild degenerative chondrosis at the ankle joint. No full-thickness chondral defect or osteochondral lesion. Small joint effusion. 4. Mild pes cavus. No stress fracture or bone lesion involving the ankle, midfoot or hindfoot.  Electronically Signed   By: Rudie Meyer M.D.   On: 05/20/2023 14:34  Past Medical/Family/Surgical/Social History: Medications & Allergies reviewed per EMR, new  medications updated. Patient Active Problem List   Diagnosis Date Noted   Complete heart block (HCC) 09/03/2022   Hot flashes 08/03/2021   Spinal stenosis of lumbar region 06/11/2019   Bilateral carpal tunnel syndrome 03/18/2019   INOCA 12/22/2017   Abnormality of gait 01/25/2016   Left foot pain  01/01/2016   Foot pain, right 01/01/2016   Callus of foot 01/01/2016   Headache, migraine 02/10/2014   Osteoporosis    DI (detrusor instability)    Fibroid    Menopausal symptoms    Non-Hodgkin lymphoma (HCC)    Past Medical History:  Diagnosis Date   Anginal pain (HCC)    Bursitis    left hip    Chronic constipation    DI (detrusor instability)    Femur fracture (HCC) 2011   Fibroid    Headache    Heart block AV third degree (HCC)    Menopausal symptoms    Non-Hodgkin lymphoma (HCC) 1992   treated with CHOP   Osteoporosis    h/o 3 stress fractures   Tennis elbow Right    Trigger finger    bilateral    Family History  Problem Relation Age of Onset   Diabetes Mother 75   Hypertension Mother    Alzheimer's disease Mother    Glaucoma Mother    Heart disease Mother    Hypertension Father    Alcoholism Father        deceased age 22   Cirrhosis Father    Diabetes Brother    Hypertension Brother    Colon cancer Maternal Uncle    Heart disease Maternal Grandmother    Breast cancer Cousin        Mat. 1st cousin-Age 34   Breast cancer Maternal Aunt        Age 45's   Heart disease Maternal Aunt    Other Paternal Grandmother        MI   Past Surgical History:  Procedure Laterality Date   Biopsy for Non Hodgkins Lymphoma  1992   CARPAL TUNNEL RELEASE Right 09/11/2019   Procedure: CARPAL TUNNEL RELEASE;  Surgeon: Betha Loa, MD;  Location: [facility];  Service: Orthopedics;  Laterality: Right;   DILATION AND CURETTAGE OF UTERUS     PACEMAKER IMPLANT N/A 09/03/2022   Procedure: PACEMAKER IMPLANT;  Surgeon: Marinus Maw, MD;  Location: [facility];   Service: Cardiovascular;  Laterality: N/A;   PELVIC LAPAROSCOPY     TOE SURGERY     TRIGGER FINGER RELEASE Left 08/04/2018   Procedure: RELEASE TRIGGER FINGER/A-1 PULLEY;  Surgeon: Betha Loa, MD;  Location: [facility];  Service: Orthopedics;  Laterality: Left;   Social History   Occupational History    Employer: [employer]    Comment: Ass. Dean and Professor  Tobacco Use   Smoking status: Never   Smokeless tobacco: Never  Vaping Use   Vaping status: Never Used  Substance and Sexual Activity   Alcohol use: Yes    Comment: social   Drug use: No   Sexual activity: Not Currently    Partners: Male    Birth control/protection: Post-menopausal

## 2023-05-24 ENCOUNTER — Encounter: Payer: Self-pay | Admitting: Internal Medicine

## 2023-05-24 ENCOUNTER — Ambulatory Visit: Admitting: Physician Assistant

## 2023-05-31 ENCOUNTER — Encounter: Payer: Self-pay | Admitting: Internal Medicine

## 2023-05-31 ENCOUNTER — Encounter: Payer: Self-pay | Admitting: Sports Medicine

## 2023-05-31 ENCOUNTER — Ambulatory Visit: Admitting: Sports Medicine

## 2023-05-31 DIAGNOSIS — M79604 Pain in right leg: Secondary | ICD-10-CM | POA: Diagnosis not present

## 2023-05-31 DIAGNOSIS — M722 Plantar fascial fibromatosis: Secondary | ICD-10-CM | POA: Diagnosis not present

## 2023-05-31 DIAGNOSIS — M79605 Pain in left leg: Secondary | ICD-10-CM

## 2023-05-31 DIAGNOSIS — E46 Unspecified protein-calorie malnutrition: Secondary | ICD-10-CM

## 2023-05-31 NOTE — Progress Notes (Signed)
 Deborah Fisher - 73 y.o. female MRN [id number]  Date of birth: 07-15-1950  Office Visit Note: Visit Date: 05/31/2023 PCP: Cleatis Polka., MD Referred by: Cleatis Polka., MD  Subjective: Chief Complaint  Patient presents with   Left Foot - Follow-up   HPI: Deborah Fisher is a pleasant 73 y.o. female who presents today for follow-up of left plantar faciitis/fibromastosis, as well as bilateral leg pain.  Left foot/PF -Xiana is making progress with this, she has been tolerating the nitroglycerin patch, she is using one half patch and changing daily.  She does note improvement in positive effect from this.  She continues with her home exercises and stretches, as well as being active with hiking although has pulled back from this recently.  She also continues with bilateral lower leg pain, that is more achy in nature and worsens with hiking or prolonged activity.  She has had ABIs which were normal without any evidence of PAD, she has used tart cherry juice as well but symptoms still persist.  We did have a discussion regarding her diet today, she does note that she does not take much protein.  We reviewed her average today, she is taking usually around 30 g of protein daily.  She has started creatine daily supplement recently.  *Of note, she is managed on Wegovy weekly, she has lost weight since beginning this. Last weight in 04/11/23 = 158 lbs.  Pertinent ROS were reviewed with the patient and found to be negative unless otherwise specified above in HPI.   Assessment & Plan: Visit Diagnoses:  1. Plantar fasciitis of left foot   2. Bilateral leg pain   3. Protein deficiency (HCC)    Plan: Impression is improving left foot plantar fasciitis.  She did make good progress with extracorporeal shockwave therapy and we transition to nitroglycerin patch 0.2 mg/h, she will continue this as she has had a positive response.  We discussed continuing this treatment for 8-12 weeks to help  augment healing and blood flow recruitment.  Review of her dietary intake does reveal a protein deficiency.  I do think this is contributing to her bilateral leg pain as it really only worsens after prolonged activity, which I think may be dietary or nutritional deficits as other testing has been negative. She would like some help and guidance revealing how she can increase both dietary and possibly supplemental protein.  I discussed the importance of this specifically given her age and the fact that she is on Wegovy, pulling from muscle > fat weight. We did send a referral to Dr. Wyona Almas, RD. did advise on 318 5 g/day supplementation as well for both muscular and neurocognitive benefit.  She is active with hiking but does not do any resistance or anaerobic training, I would like her to begin this at least twice weekly. We will send a referral to formalized physical therapy so they can work with her on building a regimen that she can feel comfortable with at home. She will f/u with me in about 2 months for f/u.  Follow-up: Return in about 2 months (around 07/31/2023) for 30-min f/u - legs/plantar fascia.   Meds & Orders: No orders of the defined types were placed in this encounter.   Orders Placed This Encounter  Procedures   Ambulatory referral to Physical Therapy   Ambulatory referral to Family Practice     Procedures: No procedures performed      Clinical History: No specialty comments  available.  She reports that she has never smoked. She has never used smokeless tobacco. No results for input(s): "HGBA1C", "LABURIC" in the last 8760 hours.  Objective:   Vital Signs: LMP 11/04/1990   Physical Exam  Gen: Well-appearing, in no acute distress; non-toxic CV: Well-perfused. Warm.  Resp: Breathing unlabored on room air; no wheezing. Psych: Fluid speech in conversation; appropriate affect; normal thought process  Ortho Exam - Bilateral feet: There is mild pes cavus noted.  There is  mild of the fat pad on the left greater than right.  No redness swelling or effusion.  Improved tenderness over the medial band of the plantar fascia.  Imaging:  MR Foot Left w/o contrast Addendum: ADDENDUM REPORT: 05/30/2023 09:38   ADDENDUM:  This is the Ankle/Hindfoot protocol which is the appropriate study  for given history (heel pain).   Electronically Signed    By: Rudie Meyer M.D.    On: 05/30/2023 09:38 Narrative: CLINICAL DATA:  Chronic left heel pain.  EXAM: MRI OF THE LEFT FOOT WITHOUT CONTRAST  TECHNIQUE: Multiplanar, multisequence MR imaging of the ankle was performed. No intravenous contrast was administered.  COMPARISON:  Radiographs 12/31/2022  FINDINGS: TENDONS  Peroneal: Normal  Posteromedial: Normal  Anterior: Normal  Achilles: Normal  Plantar Fascia: Mild diffuse thickening of the plantar fascia without nodularity to suggest fibromatosis. Minimal interstitial changes near the attachment site on the calcaneus suggesting minimal/mild plantar fasciitis. No associated inflammation/edema in the calcaneus and no evidence for a plantar fascia tear/rupture.  LIGAMENTS  Lateral: Intact  Medial: Intact  CARTILAGE  Ankle Joint: Mild degenerative chondrosis with mild cartilage thinning and mild joint space narrowing. No full-thickness chondral defect or osteochondral lesion. Small joint effusion.  Subtalar Joints/Sinus Tarsi: The subtalar joints are maintained. The sinus tarsi is normal. The cervical and interosseous ligaments are intact. The spring ligament is grossly intact.  Bones: Mild pes cavus. No stress fracture or bone lesion involving the ankle, midfoot or hindfoot. No significant midfoot degenerative changes.  Other: The ankle and foot musculature are unremarkable.  IMPRESSION: 1. Mild diffuse thickening of the plantar fascia without nodularity to suggest fibromatosis. Minimal interstitial changes near the attachment site on the  calcaneus suggesting minimal/mild plantar fasciitis. No associated inflammation/edema in the calcaneus and no evidence for a plantar fascia tear/rupture. 2. Intact medial and lateral ankle ligaments and tendons. 3. Mild degenerative chondrosis at the ankle joint. No full-thickness chondral defect or osteochondral lesion. Small joint effusion. 4. Mild pes cavus. No stress fracture or bone lesion involving the ankle, midfoot or hindfoot.  Electronically Signed: By: Rudie Meyer M.D. On: 05/20/2023 14:34  Past Medical/Family/Surgical/Social History: Medications & Allergies reviewed per EMR, new medications updated. Patient Active Problem List   Diagnosis Date Noted   Complete heart block (HCC) 09/03/2022   Hot flashes 08/03/2021   Spinal stenosis of lumbar region 06/11/2019   Bilateral carpal tunnel syndrome 03/18/2019   INOCA 12/22/2017   Abnormality of gait 01/25/2016   Left foot pain 01/01/2016   Foot pain, right 01/01/2016   Callus of foot 01/01/2016   Headache, migraine 02/10/2014   Osteoporosis    DI (detrusor instability)    Fibroid    Menopausal symptoms    Non-Hodgkin lymphoma (HCC)    Past Medical History:  Diagnosis Date   Anginal pain (HCC)    Bursitis    left hip    Chronic constipation    DI (detrusor instability)    Femur fracture (HCC) 2011   Fibroid  Headache    Heart block AV third degree (HCC)    Menopausal symptoms    Non-Hodgkin lymphoma (HCC) 1992   treated with CHOP   Osteoporosis    h/o 3 stress fractures   Tennis elbow Right    Trigger finger    bilateral    Family History  Problem Relation Age of Onset   Diabetes Mother 63   Hypertension Mother    Alzheimer's disease Mother    Glaucoma Mother    Heart disease Mother    Hypertension Father    Alcoholism Father        deceased age 67   Cirrhosis Father    Diabetes Brother    Hypertension Brother    Colon cancer Maternal Uncle    Heart disease Maternal Grandmother    Breast  cancer Cousin        Mat. 1st cousin-Age 8   Breast cancer Maternal Aunt        Age 46's   Heart disease Maternal Aunt    Other Paternal Grandmother        MI   Past Surgical History:  Procedure Laterality Date   Biopsy for Non Hodgkins Lymphoma  1992   CARPAL TUNNEL RELEASE Right 09/11/2019   Procedure: CARPAL TUNNEL RELEASE;  Surgeon: Betha Loa, MD;  Location: [facility];  Service: Orthopedics;  Laterality: Right;   DILATION AND CURETTAGE OF UTERUS     PACEMAKER IMPLANT N/A 09/03/2022   Procedure: PACEMAKER IMPLANT;  Surgeon: Marinus Maw, MD;  Location: [facility];  Service: Cardiovascular;  Laterality: N/A;   PELVIC LAPAROSCOPY     TOE SURGERY     TRIGGER FINGER RELEASE Left 08/04/2018   Procedure: RELEASE TRIGGER FINGER/A-1 PULLEY;  Surgeon: Betha Loa, MD;  Location: [facility];  Service: Orthopedics;  Laterality: Left;   Social History   Occupational History    Employer: [employer]    Comment: Ass. Dean and Professor  Tobacco Use   Smoking status: Never   Smokeless tobacco: Never  Vaping Use   Vaping status: Never Used  Substance and Sexual Activity   Alcohol use: Yes    Comment: social   Drug use: No   Sexual activity: Not Currently    Partners: Male    Birth control/protection: Post-menopausal

## 2023-05-31 NOTE — Progress Notes (Signed)
 Patient says that she has been using the nitroglycerin patches and thinks that they are having a positive effect. She also mentions her lower legs today and is inquiring about possible protein deficiency being the reason for her discomfort. She says that she consumes little protein in her daily diet.

## 2023-06-03 ENCOUNTER — Ambulatory Visit: Admitting: Cardiology

## 2023-06-04 ENCOUNTER — Ambulatory Visit (INDEPENDENT_AMBULATORY_CARE_PROVIDER_SITE_OTHER): Payer: Medicare PPO

## 2023-06-04 DIAGNOSIS — I442 Atrioventricular block, complete: Secondary | ICD-10-CM | POA: Diagnosis not present

## 2023-06-05 LAB — CUP PACEART REMOTE DEVICE CHECK
Battery Remaining Longevity: 141 mo
Battery Voltage: 3.03 V
Brady Statistic AP VP Percent: 29.59 %
Brady Statistic AP VS Percent: 0 %
Brady Statistic AS VP Percent: 70.3 %
Brady Statistic AS VS Percent: 0.12 %
Brady Statistic RA Percent Paced: 29.57 %
Brady Statistic RV Percent Paced: 99.88 %
Date Time Interrogation Session: 20250401060007
Implantable Lead Connection Status: 753985
Implantable Lead Connection Status: 753985
Implantable Lead Implant Date: 20240701
Implantable Lead Implant Date: 20240701
Implantable Lead Location: 753859
Implantable Lead Location: 753860
Implantable Lead Model: 3830
Implantable Lead Model: 5076
Implantable Pulse Generator Implant Date: 20240701
Lead Channel Impedance Value: 361 Ohm
Lead Channel Impedance Value: 380 Ohm
Lead Channel Impedance Value: 456 Ohm
Lead Channel Impedance Value: 475 Ohm
Lead Channel Pacing Threshold Amplitude: 0.375 V
Lead Channel Pacing Threshold Amplitude: 0.5 V
Lead Channel Pacing Threshold Pulse Width: 0.4 ms
Lead Channel Pacing Threshold Pulse Width: 0.4 ms
Lead Channel Sensing Intrinsic Amplitude: 19.375 mV
Lead Channel Sensing Intrinsic Amplitude: 19.375 mV
Lead Channel Sensing Intrinsic Amplitude: 5.75 mV
Lead Channel Sensing Intrinsic Amplitude: 5.75 mV
Lead Channel Setting Pacing Amplitude: 1.5 V
Lead Channel Setting Pacing Amplitude: 2 V
Lead Channel Setting Pacing Pulse Width: 0.4 ms
Lead Channel Setting Sensing Sensitivity: 1.2 mV
Zone Setting Status: 755011

## 2023-06-09 ENCOUNTER — Encounter: Payer: Self-pay | Admitting: Internal Medicine

## 2023-06-18 ENCOUNTER — Ambulatory Visit (INDEPENDENT_AMBULATORY_CARE_PROVIDER_SITE_OTHER): Admitting: Rehabilitative and Restorative Service Providers"

## 2023-06-18 ENCOUNTER — Telehealth: Payer: Self-pay | Admitting: Sports Medicine

## 2023-06-18 ENCOUNTER — Encounter: Payer: Self-pay | Admitting: Rehabilitative and Restorative Service Providers"

## 2023-06-18 DIAGNOSIS — M6281 Muscle weakness (generalized): Secondary | ICD-10-CM | POA: Diagnosis not present

## 2023-06-18 DIAGNOSIS — R262 Difficulty in walking, not elsewhere classified: Secondary | ICD-10-CM | POA: Diagnosis not present

## 2023-06-18 DIAGNOSIS — M25671 Stiffness of right ankle, not elsewhere classified: Secondary | ICD-10-CM

## 2023-06-18 DIAGNOSIS — M25672 Stiffness of left ankle, not elsewhere classified: Secondary | ICD-10-CM

## 2023-06-18 NOTE — Therapy (Signed)
 OUTPATIENT PHYSICAL THERAPY LOWER EXTREMITY EVALUATION   Patient Name: Deborah Fisher MRN: [id number] DOB:September 01, 1950, 73 y.o., female Today's Date: 06/18/2023  END OF SESSION:  PT End of Session - 06/18/23 1702     Visit Number 1    Number of Visits 16    Date for PT Re-Evaluation 08/13/23    Progress Note Due on Visit 16    PT Start Time 1605    PT Stop Time 1650    PT Time Calculation (min) 45 min    Activity Tolerance Patient tolerated treatment well;No increased pain;Patient limited by pain    Behavior During Therapy [facility] for tasks assessed/performed             Past Medical History:  Diagnosis Date   Anginal pain (HCC)    Bursitis    left hip    Chronic constipation    DI (detrusor instability)    Femur fracture (HCC) 2011   Fibroid    Headache    Heart block AV third degree (HCC)    Menopausal symptoms    Non-Hodgkin lymphoma (HCC) 1992   treated with CHOP   Osteoporosis    h/o 3 stress fractures   Tennis elbow Right    Trigger finger    bilateral    Past Surgical History:  Procedure Laterality Date   Biopsy for Non Hodgkins Lymphoma  1992   CARPAL TUNNEL RELEASE Right 09/11/2019   Procedure: CARPAL TUNNEL RELEASE;  Surgeon: Brunilda Capra, MD;  Location: [facility];  Service: Orthopedics;  Laterality: Right;   DILATION AND CURETTAGE OF UTERUS     PACEMAKER IMPLANT N/A 09/03/2022   Procedure: PACEMAKER IMPLANT;  Surgeon: Tammie Fall, MD;  [facility]: [facility];  Service: Cardiovascular;  Laterality: N/A;   PELVIC LAPAROSCOPY     TOE SURGERY     TRIGGER FINGER RELEASE Left 08/04/2018   Procedure: RELEASE TRIGGER FINGER/A-1 PULLEY;  Surgeon: Brunilda Capra, MD;  Location:  SURGERY CENTER;  Service: Orthopedics;  Laterality: Left;   Patient Active Problem List   Diagnosis Date Noted   Complete heart block (HCC) 09/03/2022   Hot flashes 08/03/2021   Spinal stenosis of lumbar region 06/11/2019   Bilateral carpal tunnel syndrome  03/18/2019   INOCA 12/22/2017   Abnormality of gait 01/25/2016   Left foot pain 01/01/2016   Foot pain, right 01/01/2016   Callus of foot 01/01/2016   Headache, migraine 02/10/2014   Osteoporosis    DI (detrusor instability)    Fibroid    Menopausal symptoms    Non-Hodgkin lymphoma (HCC)     PCP: Aimee Houseman. Ledell Prudent., MD  REFERRING PROVIDER: Shauna Del, DO  REFERRING DIAG: [phone] (ICD-10-CM) - Bilateral leg pain  THERAPY DIAG:  Difficulty in walking, not elsewhere classified  Muscle weakness (generalized)  Stiffness of left ankle, not elsewhere classified  Stiffness of right ankle, not elsewhere classified  Rationale for Evaluation and Treatment: Rehabilitation  ONSET DATE: Left hip bursitis  SUBJECTIVE:   SUBJECTIVE STATEMENT: Takako "doesn't eat meat" and feels as if a lack of protein is a factor with her leg pain.  10 months ago, she got on a weight loss drug.  She is very active and hikes 25 miles a week.  In January of 2025, she started having a hard time with her shins and calves.  She was just sore after walking, but this progressed to pain and then her legs hurt all the time.  She admits she  doesn't stretch.  After about 30 minutes of walking/hiking, she does better and doesn't have pain.  PERTINENT HISTORY: Anginal pain, left hip bursitis, previous femur fracture, headaches, osteoporosis with a history of 3 stress fractures, tennis elbow, trigger finger non-Hodgkin's lymphoma, right carpal tunnel release, pacemaker, previous toe surgery, lumbar spinal stenosis, bilateral foot pain   PAIN:  Are you having pain? Yes: NPRS scale: 0-7/10 this month Pain location: Bilateral calves and shins Pain description: Burning Aggravating factors: Up hills and starting to walk Relieving factors: Warming-up  PRECAUTIONS: None  RED FLAGS: None   WEIGHT BEARING RESTRICTIONS: No  FALLS:  Has patient fallen in last 6 months? 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  DIAGNOSTIC FINDINGS: IMPRESSION: 1. Mild diffuse thickening of the plantar fascia without nodularity to suggest fibromatosis. Minimal interstitial changes near the attachment site on the calcaneus suggesting minimal/mild plantar fasciitis. No associated inflammation/edema in the calcaneus and no evidence for a plantar fascia tear/rupture. 2. Intact medial and lateral ankle ligaments and tendons. 3. Mild degenerative chondrosis at the ankle joint. No full-thickness chondral defect or osteochondral lesion. Small joint effusion. 4. Mild pes cavus. No stress fracture or bone lesion involving the ankle, midfoot or hindfoot.  PATIENT SURVEYS:  Patient-Specific Activity Scoring Scheme  "0" represents "unable to perform." "10" represents "able to perform at prior level. 0 1 2 3 4 5 6 7 8 9  10 (Date and Score)   Activity Eval     1. Hiking uphill  2    2. Walking uphill  3    3.     4.    5.    Score 5/20    Total score = sum of the activity scores/number of activities Minimum detectable change (90%CI) for average score = 2 points Minimum detectable change (90%CI) for single activity score = 3 points   COGNITION: Overall cognitive status: Within functional limits for tasks assessed     SENSATION: WFL  EDEMA:  Not noted   LOWER EXTREMITY ROM:  Active ROM Left/Right 06/18/2023   Hip flexion    Hip extension    Hip abduction    Hip adduction    Hip internal rotation    Hip external rotation    Knee flexion    Knee extension    Ankle dorsiflexion 0/2   Ankle plantarflexion     Ankle inversion    Ankle eversion     (Blank rows = not tested)  LOWER EXTREMITY STRENGTH:  In pounds, assessed with hand-held dynamometer or MMT (dorsiflexion and plantar flexion) out of 5 Left/Right 06/18/2023   Hip flexion    Hip extension    Hip abduction    Hip adduction    Hip internal rotation    Hip external rotation    Knee flexion    Knee extension    Ankle dorsiflexion 5/4+   Ankle plantarflexion 4+/5   Ankle inversion 29.4/8.8   Ankle eversion 15.1/25.5    (Blank rows = not tested)  GAIT: Distance walked: 100 feet Assistive device utilized: None Level of assistance: Complete Independence Comments: Janazia notes more pain and stiffness  when just starting out.  Pain diminishes after 30 minutes of walking.                                                                                                                                TREATMENT DATE:  06/18/2023 Upper heel cords box 3 x 60 seconds (slight toe in) Lower heel cords box 3 x 60 seconds (slight toe in) Upper and lower heel cords stretch (slight toe in) 5 x 20 seconds each  Reviewed exam findings, HEP and expectations   PATIENT EDUCATION:  Education details: See above Person educated: Patient Education method: Explanation, Demonstration, Tactile cues, Verbal cues, and Handouts Education comprehension: verbalized understanding, returned demonstration, verbal cues required, tactile cues required, and needs further education  HOME EXERCISE PROGRAM: Access Code: [email or web address] URL: [email or web address] Date: 06/18/2023 Prepared by: Pauletta Browns  Exercises - Slant Board Gastrocnemius Stretch  - 3 x daily - 7 x weekly - 1 sets - 3 reps - 60 seconds hold - Slant Board Soleus Stretch  - 3 x daily - 7 x weekly - 1 sets - 3 reps - 60 hold - Standing Gastroc Stretch  - 3 x daily - 7 x weekly - 1 sets - 5 reps - 20 seconds hold - Standing Soleus Stretch  - 2-3 x daily - 7 x weekly - 1 sets - 5 reps  - 20 hold  ASSESSMENT:  CLINICAL IMPRESSION: Patient is a 73 y.o. female who was seen today for physical therapy evaluation and treatment for M79.604,M79.605 (ICD-10-CM) - Bilateral leg pain.  Zurie notes bilateral leg pain dating back to January of this year.  She thinks that a weight loss drug and a lack of protein may be contributing.  Objectively, she has very tight heel cords bilaterally and some weaknesses in her leg musculature, specifically ankle.  She was started on an appropriate home exercise program today with emphasis on stretching given her difficulty with walking up hills and the fact that she does better when she warms up.  We will progress strengthening next visit and address all impairments noted evaluation to prepare her for her upcoming trip to [place] in May.  OBJECTIVE IMPAIRMENTS: Abnormal gait, decreased activity tolerance, decreased endurance, decreased knowledge of condition, difficulty walking, decreased ROM, decreased strength, impaired perceived functional ability, impaired flexibility, and pain.   ACTIVITY LIMITATIONS: stairs and locomotion level  PARTICIPATION LIMITATIONS: community activity  PERSONAL FACTORS: Anginal pain, left hip bursitis, previous femur fracture, headaches, osteoporosis with a history of 3 stress fractures, tennis elbow, trigger finger non-Hodgkin's lymphoma, right carpal tunnel release, pacemaker, previous toe surgery, lumbar spinal stenosis, bilateral foot pain are also affecting patient's functional outcome.   REHAB POTENTIAL: Good  CLINICAL DECISION MAKING: Stable/uncomplicated  EVALUATION COMPLEXITY: Low   GOALS: Goals reviewed with patient? Yes  SHORT TERM GOALS: Target date: 07/16/2023 Kyndall will be independent with her day 1 home exercise program Baseline: Started 06/18/2023 Goal status: INITIAL  2.  Improve bilateral heel cords flexibility to at least 5 degrees Baseline: 0/2 degrees Goal status: INITIAL  3.  Improve  bilateral ankle strength for inversion and eversion to at least 30 pounds Baseline: See objective Goal status: INITIAL   LONG TERM GOALS: Target date: 08/13/2023  Improve Patient Specific Functional Scale score to at least 80% Baseline: 25% Goal status: INITIAL  2.  Anastassia will report bilateral leg pain consistently 2/10 or less on the visual analog scale Baseline: Can be 7/10 Goal status: INITIAL  3.  Improve bilateral heel cords flexibility to at least 10 degrees Baseline: 0/2 degrees Goal status: INITIAL  4.  Improve bilateral tibialis anterior and calf strength as assessed by MMT Baseline: See objective Goal status: INITIAL  5.  Improve bilateral ankle strength for inversion and eversion to 30+ pounds Baseline: See objective Goal status: INITIAL  6.  Kimie will be independent with her long-term home maintenance exercise program at discharge Baseline: Started 06/18/2023 Goal status: INITIAL   PLAN:  PT FREQUENCY: 1-2x/week  PT DURATION: 8 weeks  PLANNED INTERVENTIONS: 97110-Therapeutic exercises, 97530- Therapeutic activity, 97112- Neuromuscular re-education, [phone]- Self Care, 60454- Gait training, Patient/Family education, Balance training, Stair training, and Cryotherapy  PLAN FOR NEXT SESSION: Review day 1 home exercise program.  Add strength activities for plantarflexion, inversion and eversion.  Heavy emphasis on calf stretching as this appears to be 75% or more of her problem.   Joli Neas, PT, MPT 06/18/2023, 5:19 PM

## 2023-06-18 NOTE — Telephone Encounter (Signed)
 Patient said that the nutritionist never called her. CB#[phone]

## 2023-06-20 ENCOUNTER — Other Ambulatory Visit: Payer: Self-pay | Admitting: Sports Medicine

## 2023-06-20 ENCOUNTER — Encounter: Payer: Self-pay | Admitting: Sports Medicine

## 2023-06-24 ENCOUNTER — Other Ambulatory Visit: Payer: Self-pay | Admitting: Sports Medicine

## 2023-06-24 ENCOUNTER — Encounter: Payer: Self-pay | Admitting: Physical Therapy

## 2023-06-24 ENCOUNTER — Ambulatory Visit: Admitting: Physical Therapy

## 2023-06-24 DIAGNOSIS — M6281 Muscle weakness (generalized): Secondary | ICD-10-CM | POA: Diagnosis not present

## 2023-06-24 DIAGNOSIS — M25672 Stiffness of left ankle, not elsewhere classified: Secondary | ICD-10-CM | POA: Diagnosis not present

## 2023-06-24 DIAGNOSIS — R2681 Unsteadiness on feet: Secondary | ICD-10-CM

## 2023-06-24 DIAGNOSIS — R262 Difficulty in walking, not elsewhere classified: Secondary | ICD-10-CM | POA: Diagnosis not present

## 2023-06-24 DIAGNOSIS — M25671 Stiffness of right ankle, not elsewhere classified: Secondary | ICD-10-CM

## 2023-06-24 DIAGNOSIS — E46 Unspecified protein-calorie malnutrition: Secondary | ICD-10-CM

## 2023-06-24 NOTE — Therapy (Signed)
 OUTPATIENT PHYSICAL THERAPY TREATMENT   Patient Name: Deborah Fisher MRN: [id number] DOB:24-Nov-1950, 73 y.o., female Today's Date: 06/24/2023  END OF SESSION:  PT End of Session - 06/24/23 1514     Visit Number 2    Number of Visits 16    Date for PT Re-Evaluation 08/13/23    Progress Note Due on Visit 16    PT Start Time 1515    PT Stop Time 1554    PT Time Calculation (min) 39 min    Activity Tolerance Patient tolerated treatment well;No increased pain;Patient limited by pain    Behavior During Therapy [facility] for tasks assessed/performed             Past Medical History:  Diagnosis Date   Anginal pain (HCC)    Bursitis    left hip    Chronic constipation    DI (detrusor instability)    Femur fracture (HCC) 2011   Fibroid    Headache    Heart block AV third degree (HCC)    Menopausal symptoms    Non-Hodgkin lymphoma (HCC) 1992   treated with CHOP   Osteoporosis    h/o 3 stress fractures   Tennis elbow Right    Trigger finger    bilateral    Past Surgical History:  Procedure Laterality Date   Biopsy for Non Hodgkins Lymphoma  1992   CARPAL TUNNEL RELEASE Right 09/11/2019   Procedure: CARPAL TUNNEL RELEASE;  Surgeon: Brunilda Capra, MD;  Location: [facility];  Service: Orthopedics;  Laterality: Right;   DILATION AND CURETTAGE OF UTERUS     PACEMAKER IMPLANT N/A 09/03/2022   Procedure: PACEMAKER IMPLANT;  Surgeon: Tammie Fall, MD;  [facility]: [facility];  Service: Cardiovascular;  Laterality: N/A;   PELVIC LAPAROSCOPY     TOE SURGERY     TRIGGER FINGER RELEASE Left 08/04/2018   Procedure: RELEASE TRIGGER FINGER/A-1 PULLEY;  Surgeon: Brunilda Capra, MD;  Location: [facility];  Service: Orthopedics;  Laterality: Left;   Patient Active Problem List   Diagnosis Date Noted   Complete heart block (HCC) 09/03/2022   Hot flashes 08/03/2021   Spinal stenosis of lumbar region 06/11/2019   Bilateral carpal tunnel syndrome 03/18/2019    INOCA 12/22/2017   Abnormality of gait 01/25/2016   Left foot pain 01/01/2016   Foot pain, right 01/01/2016   Callus of foot 01/01/2016   Headache, migraine 02/10/2014   Osteoporosis    DI (detrusor instability)    Fibroid    Menopausal symptoms    Non-Hodgkin lymphoma (HCC)     PCP: Aimee Houseman. Ledell Prudent., MD  REFERRING PROVIDER: Shauna Del, DO  REFERRING DIAG: [phone] (ICD-10-CM) - Bilateral leg pain  THERAPY DIAG:  Difficulty in walking, not elsewhere classified  Muscle weakness (generalized)  Stiffness of left ankle, not elsewhere classified  Stiffness of right ankle, not elsewhere classified  Unsteadiness on feet  Rationale for Evaluation and Treatment: Rehabilitation  ONSET DATE: Left hip bursitis  SUBJECTIVE:   SUBJECTIVE STATEMENT:She states compliance with HEP. She states she hikes 4 days per week and walks on her days off.  PERTINENT HISTORY: Anginal pain, left hip bursitis, previous femur fracture, headaches, osteoporosis with a history of 3 stress fractures, tennis elbow, trigger finger non-Hodgkin's lymphoma, right carpal tunnel release, pacemaker, previous toe surgery, lumbar spinal stenosis, bilateral foot pain   PAIN:  Are you having pain? Yes: NPRS scale: 0-7/10 this month Pain location: Bilateral calves and shins Pain  description: Burning Aggravating factors: Up hills and starting to walk Relieving factors: Warming-up  PRECAUTIONS: None  RED FLAGS: None   WEIGHT BEARING RESTRICTIONS: No  FALLS:  Has patient fallen in last 6 months? 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  DIAGNOSTIC FINDINGS: IMPRESSION: 1. Mild diffuse thickening of the plantar fascia without nodularity to suggest fibromatosis. Minimal interstitial changes near the attachment site on the calcaneus suggesting minimal/mild plantar fasciitis. No associated inflammation/edema in the calcaneus and no evidence for a plantar fascia tear/rupture. 2. Intact medial and lateral ankle ligaments and tendons. 3. Mild degenerative chondrosis at the ankle joint. No full-thickness chondral defect or osteochondral lesion. Small joint effusion. 4. Mild pes cavus. No stress fracture or bone lesion involving the ankle, midfoot or hindfoot.  PATIENT SURVEYS:  Patient-Specific Activity Scoring Scheme  "0" represents "unable to perform." "10" represents "able to perform at prior level. 0 1 2 3 4 5 6 7 8 9  10 (Date and Score)   Activity Eval     1. Hiking uphill  2    2. Walking uphill  3    3.     4.    5.    Score 5/20    Total score = sum of the activity scores/number of activities Minimum detectable change (90%CI) for average score = 2 points Minimum detectable change (90%CI) for single activity score = 3 points   COGNITION: Overall cognitive status: Within functional limits for tasks assessed     SENSATION: WFL  EDEMA:  Not noted   LOWER EXTREMITY ROM:  Active ROM Left/Right 06/18/2023   Hip flexion    Hip extension    Hip abduction    Hip adduction    Hip internal rotation    Hip external rotation    Knee flexion    Knee extension    Ankle dorsiflexion 0/2   Ankle plantarflexion    Ankle inversion    Ankle eversion     (Blank rows = not tested)  LOWER EXTREMITY STRENGTH:  In pounds, assessed with hand-held dynamometer or MMT (dorsiflexion and plantar flexion) out of 5 Left/Right 06/18/2023   Hip flexion    Hip extension    Hip abduction    Hip adduction    Hip internal rotation    Hip external rotation    Knee flexion    Knee  extension    Ankle dorsiflexion 5/4+   Ankle plantarflexion 4+/5   Ankle inversion 29.4/8.8   Ankle eversion 15.1/25.5    (Blank rows = not tested)  GAIT: Distance walked: 100 feet Assistive device utilized: None Level of assistance: Complete Independence Comments: Deborah Fisher notes more pain and stiffness when just starting out.  Pain diminishes after 30 minutes of walking.  TREATMENT DATE:  06/24/23 Slantboard stretch 30 sec X 3 gastroc Slantboard stretch 30 sec X 3 soleus Single leg calf raises 2X10 with UE support Heel walking 10 feet X 2  Toe walking 10 feet X 2 Tandem balance 30 sec X 3 bilat Seated ankle inversion green band 2X10 bilat Seated ankle eversion green band 2X10 bilat Recumbent bike L14-12 X 6 min    06/18/2023 Upper heel cords box 3 x 60 seconds (slight toe in) Lower heel cords box 3 x 60 seconds (slight toe in) Upper and lower heel cords stretch (slight toe in) 5 x 20 seconds each  Reviewed exam findings, HEP and expectations   PATIENT EDUCATION:  Education details: See above Person educated: Patient Education method: Explanation, Demonstration, Tactile cues, Verbal cues, and Handouts Education comprehension: verbalized understanding, returned demonstration, verbal cues required, tactile cues required, and needs further education  HOME EXERCISE PROGRAM: Access Code: [email or web address] URL: [email or web address] Date: 06/24/2023 Prepared by: Jamee Mazzoni  Exercises - Slant Board Gastrocnemius Stretch  - 3 x daily - 7 x weekly - 1 sets - 3 reps - 30-60 seconds hold - Slant Board Soleus Stretch  - 3 x daily - 7 x weekly - 1 sets - 3 reps - 30-60 hold - Heel Walking  - 2 x daily - 6 x weekly - 2 sets - 10 reps - Toe Walking  - 2 x daily - 6 x weekly - 2 sets - 10 reps - Single Leg Heel Raise with Unilateral Counter Support  -  2 x daily - 6 x weekly - 2 sets - 10 reps - Tandem Stance  - 2 x daily - 6 x weekly - 1 sets - 3 reps - 30 sec hold - Ankle Inversion with Resistance  - 2 x daily - 6 x weekly - 2 sets - 10 reps - Seated Ankle Eversion with Resistance  - 2 x daily - 6 x weekly - 2 sets - 10 reps  ASSESSMENT:  CLINICAL IMPRESSION:Calf strength looks fairly well but ankle INV/EV strength could use some work so I did add strength progressions into HEP for lower leg muscles. She showed good understanding of these overall. Some of her pain can be to overuse based on how often she is hiking so I did discuss with her some back off of this and resting now walking for exercise on days she is not hiking to see if this will reduce the pain some as well.   OBJECTIVE IMPAIRMENTS: Abnormal gait, decreased activity tolerance, decreased endurance, decreased knowledge of condition, difficulty walking, decreased ROM, decreased strength, impaired perceived functional ability, impaired flexibility, and pain.   ACTIVITY LIMITATIONS: stairs and locomotion level  PARTICIPATION LIMITATIONS: community activity  PERSONAL FACTORS: Anginal pain, left hip bursitis, previous femur fracture, headaches, osteoporosis with a history of 3 stress fractures, tennis elbow, trigger finger non-Hodgkin's lymphoma, right carpal tunnel release, pacemaker, previous toe surgery, lumbar spinal stenosis, bilateral foot pain are also affecting patient's functional outcome.   REHAB POTENTIAL: Good  CLINICAL DECISION MAKING: Stable/uncomplicated  EVALUATION COMPLEXITY: Low   GOALS: Goals reviewed with patient? Yes  SHORT TERM GOALS: Target date: 07/16/2023 Deborah Fisher will be independent with her day 1 home exercise program Baseline: Started 06/18/2023 Goal status: INITIAL  2.  Improve bilateral heel cords flexibility to at least 5 degrees Baseline: 0/2 degrees Goal status: INITIAL  3.  Improve bilateral ankle strength for inversion and eversion to at  least 30 pounds Baseline: See objective Goal status: INITIAL  LONG TERM GOALS: Target date: 08/13/2023  Improve Patient Specific Functional Scale score to at least 80% Baseline: 25% Goal status: INITIAL  2.  Deborah Fisher will report bilateral leg pain consistently 2/10 or less on the visual analog scale Baseline: Can be 7/10 Goal status: INITIAL  3.  Improve bilateral heel cords flexibility to at least 10 degrees Baseline: 0/2 degrees Goal status: INITIAL  4.  Improve bilateral tibialis anterior and calf strength as assessed by MMT Baseline: See objective Goal status: INITIAL  5.  Improve bilateral ankle strength for inversion and eversion to 30+ pounds Baseline: See objective Goal status: INITIAL  6.  Deborah Fisher will be independent with her long-term home maintenance exercise program at discharge Baseline: Started 06/18/2023 Goal status: INITIAL   PLAN:  PT FREQUENCY: 1-2x/week  PT DURATION: 8 weeks  PLANNED INTERVENTIONS: 97110-Therapeutic exercises, 97530- Therapeutic activity, 97112- Neuromuscular re-education, 97535- Self Care, 95621- Gait training, Patient/Family education, Balance training, Stair training, and Cryotherapy  PLAN FOR NEXT SESSION: Calf stretching and lower leg strengthening as tolerated continue to encourage reducing the amount of hiking she is doing to see if this helps with the pain as well.   Mick Alamin, PT, DPT 06/24/2023, 4:01 PM

## 2023-06-24 NOTE — Telephone Encounter (Signed)
 Referral sent to [facility] at [place].

## 2023-07-01 ENCOUNTER — Encounter: Payer: Self-pay | Admitting: Rehabilitative and Restorative Service Providers"

## 2023-07-01 ENCOUNTER — Ambulatory Visit: Payer: Self-pay | Admitting: Rehabilitative and Restorative Service Providers"

## 2023-07-01 DIAGNOSIS — M6281 Muscle weakness (generalized): Secondary | ICD-10-CM

## 2023-07-01 DIAGNOSIS — R262 Difficulty in walking, not elsewhere classified: Secondary | ICD-10-CM

## 2023-07-01 DIAGNOSIS — M25672 Stiffness of left ankle, not elsewhere classified: Secondary | ICD-10-CM | POA: Diagnosis not present

## 2023-07-01 DIAGNOSIS — M25671 Stiffness of right ankle, not elsewhere classified: Secondary | ICD-10-CM | POA: Diagnosis not present

## 2023-07-01 NOTE — Therapy (Addendum)
 OUTPATIENT PHYSICAL THERAPY TREATMENT / DISCHARGE   Patient Name: Deborah Fisher MRN: [id number] DOB:[birth year], 73 y.o., female Today's Date: 07/01/2023  END OF SESSION:  PT End of Session - 07/01/23 0936     Visit Number 3    Number of Visits 16    Date for PT Re-Evaluation 08/13/23    Progress Note Due on Visit 16    PT Start Time 0926    PT Stop Time 1008    PT Time Calculation (min) 42 min    Activity Tolerance Patient tolerated treatment well;No increased pain    Behavior During Therapy WFL for tasks assessed/performed              Past Medical History:  Diagnosis Date   Anginal pain (HCC)    Bursitis    left hip    Chronic constipation    DI (detrusor instability)    Femur fracture (HCC) 2011   Fibroid    Headache    Heart block AV third degree (HCC)    Menopausal symptoms    Non-Hodgkin lymphoma (HCC) 1992   treated with CHOP   Osteoporosis    h/o 3 stress fractures   Tennis elbow Right    Trigger finger    bilateral    Past Surgical History:  Procedure Laterality Date   Biopsy for Non Hodgkins Lymphoma  1992   CARPAL TUNNEL RELEASE Right 09/11/2019   Procedure: CARPAL TUNNEL RELEASE;  Surgeon: Murrell Drivers, MD;  Location: [facility];  Service: Orthopedics;  Laterality: Right;   DILATION AND CURETTAGE OF UTERUS     PACEMAKER IMPLANT N/A 09/03/2022   Procedure: PACEMAKER IMPLANT;  Surgeon: Waddell Danelle ORN, MD;  Location: [facility];  Service: Cardiovascular;  Laterality: N/A;   PELVIC LAPAROSCOPY     TOE SURGERY     TRIGGER FINGER RELEASE Left 08/04/2018   Procedure: RELEASE TRIGGER FINGER/A-1 PULLEY;  Surgeon: Murrell Drivers, MD;  Location: [facility];  Service: Orthopedics;  Laterality: Left;   Patient Active Problem List   Diagnosis Date Noted   Complete heart block (HCC) 09/03/2022   Hot flashes 08/03/2021   Spinal stenosis of lumbar region 06/11/2019   Bilateral carpal tunnel syndrome 03/18/2019   INOCA  12/22/2017   Abnormality of gait 01/25/2016   Left foot pain 01/01/2016   Foot pain, right 01/01/2016   Callus of foot 01/01/2016   Headache, migraine 02/10/2014   Osteoporosis    DI (detrusor instability)    Fibroid    Menopausal symptoms    Non-Hodgkin lymphoma (HCC)     PCP: Elsie BIRCH. Loreli Raddle., MD  REFERRING PROVIDER: Lonell Sprang, DO  REFERRING DIAG: [id number] (ICD-10-CM) - Bilateral leg pain  THERAPY DIAG:  Difficulty in walking, not elsewhere classified  Muscle weakness (generalized)  Stiffness of left ankle, not elsewhere classified  Stiffness of right ankle, not elsewhere classified  Rationale for Evaluation and Treatment: Rehabilitation  ONSET DATE: Left hip bursitis  SUBJECTIVE:   SUBJECTIVE STATEMENT:  Pt indicated having complaints continued in morning and also with hiking for symptoms lower than knees.   PERTINENT HISTORY: Anginal pain, left hip bursitis, previous femur fracture, headaches, osteoporosis with a history of 3 stress fractures, tennis elbow, trigger finger non-Hodgkin's lymphoma, right carpal tunnel release, pacemaker, previous toe surgery, lumbar spinal stenosis, bilateral foot pain   PAIN:  NPRS scale: 0-7/10 this month Pain location: Bilateral calves and shins Pain description: Burning Aggravating factors: Up hills and starting to  walk Relieving factors: Warming-up  PRECAUTIONS: None  RED FLAGS: None   WEIGHT BEARING RESTRICTIONS: No  FALLS:  Has patient fallen in last 6 months? No  LIVING ENVIRONMENT: Lives with: lives alone Lives in: House/apartment Stairs: No issues, feels it in the legs Has following equipment at home: Walking sticks with recreational walking  OCCUPATION: Teaching online at [employer]  PLOF: Independent  PATIENT GOALS: Return to hiking shape, out of town trips  NEXT MD VISIT: 08/01/2023  OBJECTIVE:  Note: Objective measures were completed at Evaluation unless otherwise noted.  DIAGNOSTIC  FINDINGS: IMPRESSION: 1. Mild diffuse thickening of the plantar fascia without nodularity to suggest fibromatosis. Minimal interstitial changes near the attachment site on the calcaneus suggesting minimal/mild plantar fasciitis. No associated inflammation/edema in the calcaneus and no evidence for a plantar fascia tear/rupture. 2. Intact medial and lateral ankle ligaments and tendons. 3. Mild degenerative chondrosis at the ankle joint. No full-thickness chondral defect or osteochondral lesion. Small joint effusion. 4. Mild pes cavus. No stress fracture or bone lesion involving the ankle, midfoot or hindfoot.  PATIENT SURVEYS:  Patient-Specific Activity Scoring Scheme  0 represents "unable to perform." 10 represents "able to perform at prior level. 0 1 2 3 4 5 6 7 8 9  10 (Date and Score)   Activity Eval     1. Hiking uphill  2    2. Walking uphill  3    3.     4.    5.    Score 5/20    Total score = sum of the activity scores/number of activities Minimum detectable change (90%CI) for average score = 2 points Minimum detectable change (90%CI) for single activity score = 3 points   COGNITION: Overall cognitive status: Within functional limits for tasks assessed     SENSATION: WFL  EDEMA:  Not noted   LOWER EXTREMITY ROM:  Active ROM Left/Right 06/18/2023   Hip flexion    Hip extension    Hip abduction    Hip adduction    Hip internal rotation    Hip external rotation    Knee flexion    Knee extension    Ankle dorsiflexion 0/2   Ankle plantarflexion    Ankle inversion    Ankle eversion     (Blank rows = not tested)  LOWER EXTREMITY STRENGTH:  In pounds, assessed with hand-held dynamometer or MMT (dorsiflexion and plantar flexion) out of 5 Left/Right 06/18/2023 Right 07/01/2023 Left  07/01/2023  Hip flexion     Hip extension     Hip abduction     Hip adduction     Hip internal rotation     Hip external rotation     Knee flexion     Knee extension   5/5 28.4, 31.3 lbs 5/5 28.5, 26.6 lbs  Ankle dorsiflexion 5/4+ 4+/5 5/5  Ankle plantarflexion 4+/5    Ankle inversion 29.4/8.8    Ankle eversion 15.1/25.5     (Blank rows = not tested)  GAIT: 07/01/2023 Distance walked: 100 feet Assistive device utilized: None Level of assistance: Complete Independence Comments: Porfiria notes more pain and stiffness when just starting out.  Pain diminishes after 30 minutes of walking.  TREATMENT           DATE: 07/01/2023 Therex: Recumbent bike 10 mins, seat 6   TherActivity (to improve transfers, stairs, up/down hill hiking, squatting) Leg press double leg 75 lbs x 15 slow lowering focus, single leg x 15 43 lbs - performed bilaterally  Sit to stand to sit 18 inch chair with slow lowering x 10 no UE assist (reviewed for home use) Lateral step down 4 inch x 10 bilateral with light occasional HHA.   Neuro Re-ed (to improve neuro muscular control, balance, stability) Tandem stance on foam 1 min x 2 bilateral with occasional to moderate HHA on bars, mirror feedback  Standing feet together anterior/posterior weight shifting x 20 bilateral     TREATMENT           DATE: 06/24/23 Slantboard stretch 30 sec X 3 gastroc Slantboard stretch 30 sec X 3 soleus Single leg calf raises 2X10 with UE support Heel walking 10 feet X 2  Toe walking 10 feet X 2 Tandem balance 30 sec X 3 bilat Seated ankle inversion green band 2X10 bilat Seated ankle eversion green band 2X10 bilat Recumbent bike L14-12 X 6 min    TREATMENT           DATE:06/18/2023 Upper heel cords box 3 x 60 seconds (slight toe in) Lower heel cords box 3 x 60 seconds (slight toe in) Upper and lower heel cords stretch (slight toe in) 5 x 20 seconds each  Reviewed exam findings, HEP and expectations   PATIENT EDUCATION:  Education details: See  above Person educated: Patient Education method: Explanation, Demonstration, Tactile cues, Verbal cues, and Handouts Education comprehension: verbalized understanding, returned demonstration, verbal cues required, tactile cues required, and needs further education  HOME EXERCISE PROGRAM: Access Code: [email or web address] URL: [email or web address] Date: 06/24/2023 Prepared by: Redell Moose  Exercises - Slant Board Gastrocnemius Stretch  - 3 x daily - 7 x weekly - 1 sets - 3 reps - 30-60 seconds hold - Slant Board Soleus Stretch  - 3 x daily - 7 x weekly - 1 sets - 3 reps - 30-60 hold - Heel Walking  - 2 x daily - 6 x weekly - 2 sets - 10 reps - Toe Walking  - 2 x daily - 6 x weekly - 2 sets - 10 reps - Single Leg Heel Raise with Unilateral Counter Support  - 2 x daily - 6 x weekly - 2 sets - 10 reps - Tandem Stance  - 2 x daily - 6 x weekly - 1 sets - 3 reps - 30 sec hold - Ankle Inversion with Resistance  - 2 x daily - 6 x weekly - 2 sets - 10 reps - Seated Ankle Eversion with Resistance  - 2 x daily - 6 x weekly - 2 sets - 10 reps  ASSESSMENT:  CLINICAL IMPRESSION: Continued in clinic discussion about importance of muscle strengthening/endurance to help improve hiking tolerance.  See objective data for strength testing updates including baseline quad testing with dynamometry.  Pt to head out of country and will return May 5th.   Hip strength may impact control in step lowering, squat activity.    OBJECTIVE IMPAIRMENTS: Abnormal gait, decreased activity tolerance, decreased endurance, decreased knowledge of condition, difficulty walking, decreased ROM, decreased strength, impaired perceived functional ability, impaired flexibility, and pain.   ACTIVITY LIMITATIONS: stairs and locomotion level  PARTICIPATION LIMITATIONS: community activity  PERSONAL FACTORS: Anginal pain, left hip bursitis, previous femur fracture, headaches, osteoporosis with  a history of 3 stress fractures,  tennis elbow, trigger finger non-Hodgkin's lymphoma, right carpal tunnel release, pacemaker, previous toe surgery, lumbar spinal stenosis, bilateral foot pain are also affecting patient's functional outcome.   REHAB POTENTIAL: Good  CLINICAL DECISION MAKING: Stable/uncomplicated  EVALUATION COMPLEXITY: Low   GOALS: Goals reviewed with patient? Yes  SHORT TERM GOALS: Target date: 07/16/2023 [place] will be independent with her day 1 home exercise program Baseline: Started 06/18/2023 Goal status: on going 07/01/2023  2.  Improve bilateral heel cords flexibility to at least 5 degrees Baseline: 0/2 degrees Goal status: on going 07/01/2023  3.  Improve bilateral ankle strength for inversion and eversion to at least 30 pounds Baseline: See objective Goal status: on going 07/01/2023   LONG TERM GOALS: Target date: 08/13/2023  Improve Patient Specific Functional Scale score to at least 80% Baseline: 25% Goal status: on going 07/01/2023  2.  Hartley will report bilateral leg pain consistently 2/10 or less on the visual analog scale Baseline: Can be 7/10 Goal status: INIon going 4/28/2025TIAL  3.  Improve bilateral heel cords flexibility to at least 10 degrees Baseline: 0/2 degrees Goal status: on going 07/01/2023  4.  Improve bilateral tibialis anterior and calf strength as assessed by MMT Baseline: See objective Goal status: on going 07/01/2023  5.  Improve bilateral ankle strength for inversion and eversion to 30+ pounds Baseline: See objective Goal status: on going 07/01/2023  6.  Husna will be independent with her long-term home maintenance exercise program at discharge Baseline: Started 06/18/2023 Goal status: on going 07/01/2023   PLAN:  PT FREQUENCY: 1-2x/week  PT DURATION: 8 weeks  PLANNED INTERVENTIONS: 97110-Therapeutic exercises, 97530- Therapeutic activity, 97112- Neuromuscular re-education, 97535- Self Care, [id number]- Gait training, Patient/Family education, Balance  training, Stair training, and Cryotherapy  PLAN FOR NEXT SESSION: Continue LE strengthening/endurance to help improve hiking tolerance/performance with continued monitoring of symptoms.   Ozell Silvan, PT, DPT, OCS, ATC 07/01/23  10:08 AM    PHYSICAL THERAPY DISCHARGE SUMMARY  Visits from Start of Care: 3  Current functional level related to goals / functional outcomes: See note   Remaining deficits: See note   Education / Equipment: HEP  Patient goals were partially met. Patient is being discharged due to not returning since the last visit.  Ozell Silvan, PT, DPT, OCS, ATC 10/18/23  10:29 AM

## 2023-07-08 ENCOUNTER — Encounter: Admitting: Rehabilitative and Restorative Service Providers"

## 2023-07-10 ENCOUNTER — Other Ambulatory Visit: Payer: Self-pay | Admitting: Sports Medicine

## 2023-07-11 ENCOUNTER — Encounter: Admitting: Rehabilitative and Restorative Service Providers"

## 2023-07-11 DIAGNOSIS — H04123 Dry eye syndrome of bilateral lacrimal glands: Secondary | ICD-10-CM | POA: Diagnosis not present

## 2023-07-11 DIAGNOSIS — G43809 Other migraine, not intractable, without status migrainosus: Secondary | ICD-10-CM | POA: Diagnosis not present

## 2023-07-11 DIAGNOSIS — H2513 Age-related nuclear cataract, bilateral: Secondary | ICD-10-CM | POA: Diagnosis not present

## 2023-07-11 DIAGNOSIS — H401232 Low-tension glaucoma, bilateral, moderate stage: Secondary | ICD-10-CM | POA: Diagnosis not present

## 2023-07-18 NOTE — Progress Notes (Signed)
 Remote pacemaker transmission.

## 2023-07-18 NOTE — Addendum Note (Signed)
 Addended by: Lott Rouleau A on: 07/18/2023 09:06 AM   Modules accepted: Orders

## 2023-07-24 ENCOUNTER — Encounter: Admitting: Rehabilitative and Restorative Service Providers"

## 2023-07-24 ENCOUNTER — Other Ambulatory Visit: Payer: Self-pay | Admitting: Sports Medicine

## 2023-07-24 DIAGNOSIS — M4800 Spinal stenosis, site unspecified: Secondary | ICD-10-CM

## 2023-07-26 ENCOUNTER — Encounter: Admitting: Rehabilitative and Restorative Service Providers"

## 2023-07-31 ENCOUNTER — Encounter: Admitting: Rehabilitative and Restorative Service Providers"

## 2023-08-01 ENCOUNTER — Ambulatory Visit: Admitting: Sports Medicine

## 2023-08-02 ENCOUNTER — Encounter: Admitting: Rehabilitative and Restorative Service Providers"

## 2023-08-06 ENCOUNTER — Encounter: Payer: Self-pay | Admitting: Internal Medicine

## 2023-08-07 ENCOUNTER — Telehealth: Payer: Self-pay | Admitting: Internal Medicine

## 2023-08-07 ENCOUNTER — Encounter: Admitting: Rehabilitative and Restorative Service Providers"

## 2023-08-07 NOTE — Telephone Encounter (Signed)
 Unfortunately [id number] Rx won't be covered  Needs to have symptomatic CAD Get other labs (A1C, lipid panel) from PCP

## 2023-08-09 ENCOUNTER — Encounter: Admitting: Rehabilitative and Restorative Service Providers"

## 2023-08-12 NOTE — Telephone Encounter (Signed)
 Pt.notified

## 2023-08-14 ENCOUNTER — Encounter: Admitting: Rehabilitative and Restorative Service Providers"

## 2023-08-15 ENCOUNTER — Ambulatory Visit: Admitting: Sports Medicine

## 2023-08-15 DIAGNOSIS — R29898 Other symptoms and signs involving the musculoskeletal system: Secondary | ICD-10-CM | POA: Diagnosis not present

## 2023-08-15 DIAGNOSIS — M79604 Pain in right leg: Secondary | ICD-10-CM | POA: Diagnosis not present

## 2023-08-15 DIAGNOSIS — M79605 Pain in left leg: Secondary | ICD-10-CM | POA: Diagnosis not present

## 2023-08-15 DIAGNOSIS — M722 Plantar fascial fibromatosis: Secondary | ICD-10-CM | POA: Diagnosis not present

## 2023-08-15 DIAGNOSIS — E46 Unspecified protein-calorie malnutrition: Secondary | ICD-10-CM

## 2023-08-15 DIAGNOSIS — M48062 Spinal stenosis, lumbar region with neurogenic claudication: Secondary | ICD-10-CM

## 2023-08-15 NOTE — Progress Notes (Signed)
 Deborah Fisher - 73 y.o. female MRN [id number]  Date of birth: 1950-05-08  Office Visit Note: Visit Date: 08/15/2023 PCP: Jeannine Milroy., MD Referred by: Jeannine Milroy., MD  Subjective: Chief Complaint  Patient presents with   Left Foot - Follow-up   HPI: Deborah Fisher is a pleasant 73 y.o. female who presents today for follow-up of left foot plantar fasciitis/fibromatosis as well as bilateral leg pain/cramping.  Left foot/PF -this continues to do much better.  She has progressed through nitroglycerin  patch and was using this with improvement although recently started to feel like it was worsening symptoms.  She has been able to hike and this is not holding her back.  She continues with bilateral lower leg pain that is more achy and cramping in nature.  This goes from the knees down to the ankles.  She has been working on her diet and ingesting protein supplementation.  We also discussed creatine in the past.  She is weaning off of [place] given insurance lack of coverage.  She was unable to make contact with prior nutrition/dietitian referral but is interested in this.   She has a history of spinal stenosis confirmed by MRI years ago.  Back pain is not present all the time but she feels cramping and weakness at times in the legs that worsens after prolonged walking or standing.  Pertinent ROS were reviewed with the patient and found to be negative unless otherwise specified above in HPI.   Assessment & Plan: Visit Diagnoses:  1. Spinal stenosis of lumbar region with neurogenic claudication   2. Bilateral leg pain   3. Leg weakness, bilateral   4. Plantar fasciitis of left foot   5. Protein deficiency (HCC)    Plan: Impression is significantly improving left foot plantar fasciitis.  She also has been dealing with ongoing bilateral leg pain/cramping and weakness after prolonged walking or standing.  We have been working this up and has found some improvements with physical  therapy, protein supplementation and creatine use although her symptoms continue.  She has a history of lumbar stenosis but has not been evaluated with repeat imaging in about 10 years.  She will continue her stretching and her home physical therapy.  I would like to move forward with MRI to evaluate any advancement of lumbar stenosis or internal pathology that may be contributing to her leg cramping and reported weakness.  We would discontinue nitroglycerin  patch going forward for her plantar fasciitis given timeframe of use and improvement.  She still is interested in seeing dietitian/nutrition, she was authorized, I will send a message to our coordinator to see if they can reach back out to contact her or provide number so Deborah Fisher can call them to set this up.  Follow-up: Return for f/u 1 week after lumbar MRI (consider Dr. Sulema Endo if significant pathology).   Meds & Orders: No orders of the defined types were placed in this encounter.   Orders Placed This Encounter  Procedures   MR Lumbar Spine w/o contrast     Procedures: No procedures performed      Clinical History: No specialty comments available.  She reports that she has never smoked. She has never used smokeless tobacco. No results for input(s): HGBA1C, LABURIC in the last 8760 hours.  Objective:   Vital Signs: LMP 11/04/1990   Physical Exam  Gen: Well-appearing, in no acute distress; non-toxic CV: Well-perfused. Warm.  Resp: Breathing unlabored on room air; no wheezing. Psych:  Fluid speech in conversation; appropriate affect; normal thought process  Ortho Exam - Left foot: There is no significant tenderness at the insertion of the plantar fascia or medial band.  No redness swelling or effusion.  - Lumbar: There is intact flexion and extension of the lumbar spine.  There are no gross weakness deficits from the L3-S1 myotome although reported cramping in the lower legs (worsens with activity)  Imaging: No results  found.  Past Medical/Family/Surgical/Social History: Medications & Allergies reviewed per EMR, new medications updated. Patient Active Problem List   Diagnosis Date Noted   Complete heart block (HCC) 09/03/2022   Hot flashes 08/03/2021   Spinal stenosis of lumbar region 06/11/2019   Bilateral carpal tunnel syndrome 03/18/2019   INOCA 12/22/2017   Abnormality of gait 01/25/2016   Left foot pain 01/01/2016   Foot pain, right 01/01/2016   Callus of foot 01/01/2016   Headache, migraine 02/10/2014   Osteoporosis    DI (detrusor instability)    Fibroid    Menopausal symptoms    Non-Hodgkin lymphoma (HCC)    Past Medical History:  Diagnosis Date   Anginal pain (HCC)    Bursitis    left hip    Chronic constipation    DI (detrusor instability)    Femur fracture (HCC) 2011   Fibroid    Headache    Heart block AV third degree (HCC)    Menopausal symptoms    Non-Hodgkin lymphoma (HCC) 1992   treated with CHOP   Osteoporosis    h/o 3 stress fractures   Tennis elbow Right    Trigger finger    bilateral    Family History  Problem Relation Age of Onset   Diabetes Mother 35   Hypertension Mother    Alzheimer's disease Mother    Glaucoma Mother    Heart disease Mother    Hypertension Father    Alcoholism Father        deceased age 74   Cirrhosis Father    Diabetes Brother    Hypertension Brother    Colon cancer Maternal Uncle    Heart disease Maternal Grandmother    Breast cancer Cousin        Mat. 1st cousin-Age 42   Breast cancer Maternal Aunt        Age 80's   Heart disease Maternal Aunt    Other Paternal Grandmother        MI   Past Surgical History:  Procedure Laterality Date   Biopsy for Non Hodgkins Lymphoma  1992   CARPAL TUNNEL RELEASE Right 09/11/2019   Procedure: CARPAL TUNNEL RELEASE;  Surgeon: Brunilda Capra, MD;  Location: [facility];  Service: Orthopedics;  Laterality: Right;   DILATION AND CURETTAGE OF UTERUS     PACEMAKER IMPLANT N/A  09/03/2022   Procedure: PACEMAKER IMPLANT;  Surgeon: Tammie Fall, MD;  Location: [facility];  Service: Cardiovascular;  Laterality: N/A;   PELVIC LAPAROSCOPY     TOE SURGERY     TRIGGER FINGER RELEASE Left 08/04/2018   Procedure: RELEASE TRIGGER FINGER/A-1 PULLEY;  Surgeon: Brunilda Capra, MD;  Location: [facility];  Service: Orthopedics;  Laterality: Left;   Social History   Occupational History    Employer: [employer]    Comment: Ass. Dean and Professor  Tobacco Use   Smoking status: Never   Smokeless tobacco: Never  Vaping Use   Vaping status: Never Used  Substance and Sexual Activity   Alcohol  use:  Yes    Comment: social   Drug use: No   Sexual activity: Not Currently    Partners: Male    Birth control/protection: Post-menopausal

## 2023-08-15 NOTE — Progress Notes (Signed)
 Patient says that she has had overall improvement with the fatigue in the legs and the plantar fasciitis. She went to a few sessions of physical therapy and continues to do her stretches at home. She has also increased her protein intake which she believes has helped as well.

## 2023-08-16 ENCOUNTER — Encounter: Admitting: Rehabilitative and Restorative Service Providers"

## 2023-08-16 ENCOUNTER — Encounter: Payer: Self-pay | Admitting: Sports Medicine

## 2023-08-20 DIAGNOSIS — L669 Cicatricial alopecia, unspecified: Secondary | ICD-10-CM | POA: Diagnosis not present

## 2023-08-24 NOTE — Progress Notes (Unsigned)
 Cardiology Office Note:    Date:  08/29/2023   ID:  Deborah Fisher, DOB 1950/06/30, MRN [id number]  PCP:  Loreli Elsie JONETTA Mickey., MD    Pt presents for follow up of CP   History of Present Illness:    Deborah Fisher is a 73 y.o. female with a hx of of non Hodgkin's lymphoma  and chest pain     CCTA in 2019 Calcium score 0  No signficant CAD     Pt seen by B Munley in the past   ? Syndrome X Pt then seen by VEAR Sharps     May 2024   Zio monitor showed episodes of CHB during day    She underwent PPM placment in Summer 2024   She follows with KANDICE Birmingham   I saw the pt in 2024  Since seen she notes occasional CP   Not necessarily with activity    She has had some at rest, in bed    Question if related to GI issues  She says her breathing is good    She denies DOE or CP with activity  Diet:     Had leg pain   Had protein smoothin  AM   Eggs and salmon or cheese rounds  Toast  Jelly Other day steel cut oatmeal   Brown sugar Lunch   Main meal   Seafood gazpacho    Jane brodie   Pita chips    Walkers fresh bread cookies 85% cocoa chocolate Dinner  Sweet potato with spinach     Pt drinks 2 glasses of tart cherry juice per day to help her sleep   Past Medical History:  Diagnosis Date   Anginal pain (HCC)    Bursitis    left hip    Chronic constipation    Coronary artery disease 03/2019   1st and 2nd degree AV   DI (detrusor instability)    Femur fracture (HCC) 2011   Fibroid    Headache    Heart block AV third degree (HCC)    Hypertension 03/05/2022   Most recent measurements are higher than usual.   Menopausal symptoms    Non-Hodgkin lymphoma (HCC) 1992   treated with CHOP   Osteoporosis    h/o 3 stress fractures   Syncope and collapse 06/18/2022   Near blackout at end of hike.   Tennis elbow Right    Trigger finger    bilateral     Past Surgical History:  Procedure Laterality Date   Biopsy for Non Hodgkins Lymphoma  1992   CARPAL TUNNEL RELEASE Right 09/11/2019    Procedure: CARPAL TUNNEL RELEASE;  Surgeon: Murrell Drivers, MD;  Location: [facility];  Service: Orthopedics;  Laterality: Right;   DILATION AND CURETTAGE OF UTERUS     PACEMAKER IMPLANT N/A 09/03/2022   Procedure: PACEMAKER IMPLANT;  Surgeon: Birmingham Danelle ORN, MD;  Location: [facility];  Service: Cardiovascular;  Laterality: N/A;   PELVIC LAPAROSCOPY     TOE SURGERY     TRIGGER FINGER RELEASE Left 08/04/2018   Procedure: RELEASE TRIGGER FINGER/A-1 PULLEY;  Surgeon: Murrell Drivers, MD;  Location:  SURGERY CENTER;  Service: Orthopedics;  Laterality: Left;    Current Medications: Current Meds  Medication Sig   acetaminophen  (TYLENOL ) 500 MG tablet Take 2 tablets (1,000 mg total) by mouth every 6 (six) hours as needed. (Patient taking differently: Take 1,000 mg by mouth every 6 (six) hours as needed for moderate  pain (pain score 4-6).)   alendronate (FOSAMAX) 70 MG tablet Take 1 tablet by mouth once a week.   aspirin  81 MG EC tablet TAKE 1 TABLET BY MOUTH EVERY DAY (Patient taking differently: Take 81 mg by mouth daily.)   brimonidine (ALPHAGAN) 0.2 % ophthalmic solution Place 1 drop into both eyes 2 (two) times daily.   Calcium Carbonate-Vitamin D (CALCIUM + D PO) Take 1 tablet by mouth 2 (two) times daily.    cetirizine (ZYRTEC) 10 MG tablet Take 10 mg by mouth every evening.   Cholecalciferol (VITAMIN D) 50 MCG (2000 UT) CAPS Take 2,000 Units by mouth in the morning and at bedtime.   Cyanocobalamin (VITAMIN B 12 PO) Take 1 tablet by mouth daily.    Fezolinetant  (VEOZAH ) 45 MG TABS Take 1 tablet (45 mg total) by mouth daily.   gabapentin  (NEURONTIN ) 300 MG capsule Take 2 capsules (600 mg total) by mouth 2 (two) times daily.   latanoprost (XALATAN) 0.005 % ophthalmic solution Place 1 drop into both eyes at bedtime.   Magnesium 200 MG TABS Take 200 mg by mouth at bedtime.   Multiple Vitamin (MULTIVITAMIN) tablet Take 1 tablet by mouth daily.   nitroGLYCERIN  (NITRODUR -  DOSED IN MG/24 HR) 0.2 mg/hr patch CUT PATCH INTO FOURTHS. APPLY 1/4 PATCH TO AFFECTED AREA ON HEEL. CHANGE ONCE DAILY.   nitroGLYCERIN  (NITROSTAT ) 0.4 MG SL tablet Place 1 tablet (0.4 mg total) under the tongue every 5 (five) minutes as needed for chest pain.   rizatriptan (MAXALT-MLT) 10 MG disintegrating tablet Take 10 mg by mouth as needed for migraine.   rosuvastatin (CRESTOR) 10 MG tablet Take 10 mg by mouth daily.   tacrolimus (PROTOPIC) 0.1 % ointment Apply 1 Application topically See admin instructions. 1 application for 5 days on and 2 days off     Allergies:   Iodinated contrast media and Tape   Social History   Socioeconomic History   Marital status: Single    Spouse name: Not on file   Number of children: 0   Years of education: PhD   Highest education level: Not on file  Occupational History    Employer: [employer]    Comment: Ass. Dean and Professor  Tobacco Use   Smoking status: Never   Smokeless tobacco: Never  Vaping Use   Vaping status: Never Used  Substance and Sexual Activity   Alcohol  use: Yes    Comment: social   Drug use: No   Sexual activity: Not Currently    Partners: Male    Birth control/protection: Post-menopausal  Other Topics Concern   Not on file  Social History Narrative   Not on file   Social Drivers of Health   Financial Resource Strain: Not on file  Food Insecurity: Not on file  Transportation Needs: Not on file  Physical Activity: Not on file  Stress: Not on file  Social Connections: Unknown (07/16/2021)   Received from [employer]   Social Network    Social Network: Not on file     Family History: The patient's family history includes Alcoholism in her father; Alzheimer's disease in her mother; Arrhythmia in her maternal aunt and maternal grandmother; Asthma in her brother; Breast cancer in her cousin and maternal aunt; Cirrhosis in her father; Colon cancer in her maternal uncle; Diabetes in her brother; Diabetes (age of onset: 35)  in her mother; Glaucoma in her mother; Heart attack in her maternal grandmother; Heart disease in her maternal aunt, maternal aunt,  maternal grandmother, and mother; Hypertension in her brother, father, and mother; Other in her paternal grandmother.  ROS:   Please see the history of present illness.    No episodes of syncope or other problems.  All other systems reviewed and are negative.  EKGs/Labs/Other Studies Reviewed:    The following studies were reviewed today:  2D Doppler echocardiogram 2020 Study Conclusions   - Left ventricle: The cavity size was normal. Systolic function was    normal. The estimated ejection fraction was in the range of 55%    to 60%. Wall motion was normal; there were no regional wall    motion abnormalities. Left ventricular diastolic function    parameters were normal.  - Atrial septum: No defect or patent foramen ovale was identified.   Impressions:   - Normal GLS -18.3.   Coronary CTA 2020 MPRESSION: 1.  Coronary artery calcium score 0 Agatston units.   2.  No significant coronary disease noted  EKG:  EKG not done today   Recent Labs: 12/28/2022: ALT 20  Recent Lipid Panel    Component Value Date/Time   CHOL 221 (H) 12/19/2017 0000   TRIG 65 12/19/2017 0000   HDL 122 12/19/2017 0000   CHOLHDL 1.8 12/19/2017 0000   LDLCALC 86 12/19/2017 0000    Physical Exam:    VS:  BP 98/62   Pulse 69   Ht 5' 5 (1.651 m)   Wt 158 lb 3.2 oz (71.8 kg)   LMP 11/04/1990   SpO2 97%   BMI 26.33 kg/m     Wt Readings from Last 3 Encounters:  08/29/23 158 lb 3.2 oz (71.8 kg)  04/11/23 158 lb (71.7 kg)  01/03/23 161 lb 9.6 oz (73.3 kg)     GEN: PT is in NAD HEENT: Normal NECK: JVP is not elevated  CARDIAC: RRR  No murmurs  No LE edema. RESPIRATORY:  Clear to auscultation  ABDOMEN: Soft, non-tender, non-distended,No masses    ASSESSMENT:    1  Chest pain   ANOCA   Pt with CCTA in 2019 that showed Ca score 0   No CAD     Consider for  microvascular dz   Has NTG to take   REmains active  2  Hx CHB   Pt is s/p PPM  3  2  Rhythm  EKG with SR with Type I second degree AV block   Sortest PR interval is over 200 msec   With dyspnea will set up for an Zio patch   4  Blood pressure   BP has been labile   Excellent today, denies dizziness  5  MEtabolics  Pt lost weight on Ozempic  Unfortunately will not get covered by insurance     Reviewed diet with her   She is consuming a lot of carbs Given resources ([employer], LEVELS metabolic insights)    Work on diet   Cut out sugar   Signed, Vina Gull, MD  08/29/2023 10:20 PM    [facility]

## 2023-08-28 NOTE — CV Procedure (Signed)
  Device system confirmed to be MRI conditional, with implant date > 6 weeks ago, and no evidence of abandoned or epicardial leads in review of most recent CXR  Device last cleared by EP Provider: Suzann Riddle 08/28/23  Clearance is good through for 1 year as long as parameters remain stable at time of check. If pt undergoes a cardiac device procedure during that time, they should be re-cleared.   Tachy-therapies to be programmed off if applicable with device back to pre-MRI settings after completion of exam.  [employer] - Programming recommendation received through [employer] App/Tablet  Izetta CHRISTELLA Linen, RT  08/28/2023 10:14 AM

## 2023-08-29 ENCOUNTER — Ambulatory Visit: Attending: Internal Medicine | Admitting: Internal Medicine

## 2023-08-29 ENCOUNTER — Encounter: Payer: Self-pay | Admitting: Internal Medicine

## 2023-08-29 VITALS — BP 98/62 | HR 69 | Ht 65.0 in | Wt 158.2 lb

## 2023-08-29 DIAGNOSIS — I442 Atrioventricular block, complete: Secondary | ICD-10-CM | POA: Diagnosis not present

## 2023-08-29 NOTE — Patient Instructions (Signed)
Medication Instructions:  Your physician recommends that you continue on your current medications as directed. Please refer to the Current Medication list given to you today.   Labwork: none  Testing/Procedures: none  Follow-Up: 1 year  Any Other Special Instructions Will Be Listed Below (If Applicable).  If you need a refill on your cardiac medications before your next appointment, please call your pharmacy.

## 2023-09-03 ENCOUNTER — Encounter (HOSPITAL_COMMUNITY): Payer: Self-pay | Admitting: Radiology

## 2023-09-03 ENCOUNTER — Ambulatory Visit (INDEPENDENT_AMBULATORY_CARE_PROVIDER_SITE_OTHER): Payer: Medicare PPO

## 2023-09-03 ENCOUNTER — Ambulatory Visit (HOSPITAL_COMMUNITY)
Admission: RE | Admit: 2023-09-03 | Discharge: 2023-09-03 | Disposition: A | Discharge status: Home / Self Care | Source: Ambulatory Visit | Attending: Sports Medicine | Admitting: Sports Medicine

## 2023-09-03 DIAGNOSIS — M48062 Spinal stenosis, lumbar region with neurogenic claudication: Secondary | ICD-10-CM | POA: Diagnosis not present

## 2023-09-03 DIAGNOSIS — M4186 Other forms of scoliosis, lumbar region: Secondary | ICD-10-CM | POA: Diagnosis not present

## 2023-09-03 DIAGNOSIS — M47817 Spondylosis without myelopathy or radiculopathy, lumbosacral region: Secondary | ICD-10-CM | POA: Diagnosis not present

## 2023-09-03 DIAGNOSIS — M79604 Pain in right leg: Secondary | ICD-10-CM | POA: Insufficient documentation

## 2023-09-03 DIAGNOSIS — I442 Atrioventricular block, complete: Secondary | ICD-10-CM | POA: Diagnosis not present

## 2023-09-03 DIAGNOSIS — M48061 Spinal stenosis, lumbar region without neurogenic claudication: Secondary | ICD-10-CM | POA: Diagnosis not present

## 2023-09-03 DIAGNOSIS — M47816 Spondylosis without myelopathy or radiculopathy, lumbar region: Secondary | ICD-10-CM | POA: Diagnosis not present

## 2023-09-03 DIAGNOSIS — M79605 Pain in left leg: Secondary | ICD-10-CM | POA: Insufficient documentation

## 2023-09-03 LAB — CUP PACEART REMOTE DEVICE CHECK
Battery Remaining Longevity: 138 mo
Battery Voltage: 3.01 V
Brady Statistic AP VP Percent: 36.73 %
Brady Statistic AP VS Percent: 0 %
Brady Statistic AS VP Percent: 63.23 %
Brady Statistic AS VS Percent: 0.04 %
Brady Statistic RA Percent Paced: 36.69 %
Brady Statistic RV Percent Paced: 99.96 %
Date Time Interrogation Session: 20250701061603
Implantable Lead Connection Status: 753985
Implantable Lead Connection Status: 753985
Implantable Lead Implant Date: 20240701
Implantable Lead Implant Date: 20240701
Implantable Lead Location: 753859
Implantable Lead Location: 753860
Implantable Lead Model: 3830
Implantable Lead Model: 5076
Implantable Pulse Generator Implant Date: 20240701
Lead Channel Impedance Value: 342 Ohm
Lead Channel Impedance Value: 361 Ohm
Lead Channel Impedance Value: 456 Ohm
Lead Channel Impedance Value: 570 Ohm
Lead Channel Pacing Threshold Amplitude: 0.375 V
Lead Channel Pacing Threshold Amplitude: 0.75 V
Lead Channel Pacing Threshold Pulse Width: 0.4 ms
Lead Channel Pacing Threshold Pulse Width: 0.4 ms
Lead Channel Sensing Intrinsic Amplitude: 23.375 mV
Lead Channel Sensing Intrinsic Amplitude: 23.375 mV
Lead Channel Sensing Intrinsic Amplitude: 4.5 mV
Lead Channel Sensing Intrinsic Amplitude: 4.5 mV
Lead Channel Setting Pacing Amplitude: 1.5 V
Lead Channel Setting Pacing Amplitude: 2 V
Lead Channel Setting Pacing Pulse Width: 0.4 ms
Lead Channel Setting Sensing Sensitivity: 1.2 mV
Zone Setting Status: 755011

## 2023-09-04 ENCOUNTER — Ambulatory Visit: Payer: Self-pay | Admitting: Internal Medicine

## 2023-09-09 ENCOUNTER — Ambulatory Visit: Admitting: Sports Medicine

## 2023-09-09 DIAGNOSIS — R252 Cramp and spasm: Secondary | ICD-10-CM | POA: Diagnosis not present

## 2023-09-09 DIAGNOSIS — N281 Cyst of kidney, acquired: Secondary | ICD-10-CM

## 2023-09-09 DIAGNOSIS — M48062 Spinal stenosis, lumbar region with neurogenic claudication: Secondary | ICD-10-CM

## 2023-09-09 DIAGNOSIS — M51369 Other intervertebral disc degeneration, lumbar region without mention of lumbar back pain or lower extremity pain: Secondary | ICD-10-CM | POA: Diagnosis not present

## 2023-09-09 NOTE — Progress Notes (Unsigned)
 Deborah Fisher - 73 y.o. female MRN [id number]  Date of birth: May 05, 1950  Office Visit Note: Visit Date: 09/09/2023 PCP: Loreli Elsie JONETTA Mickey., MD Referred by: Loreli Elsie JONETTA Mickey., MD  Subjective: Chief Complaint  Patient presents with  . Lower Back - Follow-up   HPI: Deborah Fisher is a pleasant 73 y.o. female who presents today for ***.  Pertinent ROS were reviewed with the patient and found to be negative unless otherwise specified above in HPI.   Assessment & Plan: Visit Diagnoses: No diagnosis found.  Plan: ***  Follow-up: No follow-ups on file.   Meds & Orders: No orders of the defined types were placed in this encounter.  No orders of the defined types were placed in this encounter.    Procedures: No procedures performed      Clinical History: No specialty comments available.  She reports that she has never smoked. She has never used smokeless tobacco. No results for input(s): HGBA1C, LABURIC in the last 8760 hours.  Objective:   Vital Signs: LMP 11/04/1990   Physical Exam  Gen: Well-appearing, in no acute distress; non-toxic CV: Well-perfused. Warm.  Resp: Breathing unlabored on room air; no wheezing. Psych: Fluid speech in conversation; appropriate affect; normal thought process  Ortho Exam - ***  Imaging: No results found.  Past Medical/Family/Surgical/Social History: Medications & Allergies reviewed per EMR, new medications updated. Patient Active Problem List   Diagnosis Date Noted  . Complete heart block (HCC) 09/03/2022  . Hot flashes 08/03/2021  . Spinal stenosis of lumbar region 06/11/2019  . Bilateral carpal tunnel syndrome 03/18/2019  . INOCA 12/22/2017  . Abnormality of gait 01/25/2016  . Left foot pain 01/01/2016  . Foot pain, right 01/01/2016  . Callus of foot 01/01/2016  . Headache, migraine 02/10/2014  . Osteoporosis   . DI (detrusor instability)   . Fibroid   . Menopausal symptoms   . Non-Hodgkin lymphoma [facility])     Past Medical History:  Diagnosis Date  . Anginal pain (HCC)   . Bursitis    left hip   . Chronic constipation   . Coronary artery disease 03/2019   1st and 2nd degree AV  . DI (detrusor instability)   . Femur fracture (HCC) 2011  . Fibroid   . Headache   . Heart block AV third degree (HCC)   . Hypertension 03/05/2022   Most recent measurements are higher than usual.  . Menopausal symptoms   . Non-Hodgkin lymphoma (HCC) 1992   treated with CHOP  . Osteoporosis    h/o 3 stress fractures  . Syncope and collapse 06/18/2022   Near blackout at end of hike.  . Tennis elbow Right   . Trigger finger    bilateral    Family History  Problem Relation Age of Onset  . Diabetes Mother 53  . Hypertension Mother   . Alzheimer's disease Mother   . Glaucoma Mother   . Heart disease Mother   . Hypertension Father   . Alcoholism Father        deceased age 40  . Cirrhosis Father   . Diabetes Brother   . Hypertension Brother   . Asthma Brother        Childhood  . Colon cancer Maternal Uncle   . Heart disease Maternal Grandmother   . Arrhythmia Maternal Grandmother        No sure.  SABRA Heart attack Maternal Grandmother        Passed away at  age 53 from heart attack.  . Breast cancer Cousin        Mat. 1st cousin-Age 41  . Breast cancer Maternal Aunt        Age 101's  . Heart disease Maternal Aunt   . Other Paternal Grandmother        MI  . Arrhythmia Maternal Aunt        No sure.  SABRA Heart disease Maternal Aunt        Angina   Past Surgical History:  Procedure Laterality Date  . Biopsy for Non Hodgkins Lymphoma  03/05/1990  . CARPAL TUNNEL RELEASE Right 09/11/2019   Procedure: CARPAL TUNNEL RELEASE;  Surgeon: Murrell Drivers, MD;  Location: [facility];  Service: Orthopedics;  Laterality: Right;  . DILATION AND CURETTAGE OF UTERUS    . PACEMAKER IMPLANT N/A 09/03/2022   Procedure: PACEMAKER IMPLANT;  Surgeon: Waddell Danelle ORN, MD;  Location: [facility];   Service: Cardiovascular;  Laterality: N/A;  . PELVIC LAPAROSCOPY    . TOE SURGERY    . TRIGGER FINGER RELEASE Left 08/04/2018   Procedure: RELEASE TRIGGER FINGER/A-1 PULLEY;  Surgeon: Murrell Drivers, MD;  Location: [facility];  Service: Orthopedics;  Laterality: Left;   Social History   Occupational History    Employer: [employer]    Comment: Ass. Addie and Professor  Tobacco Use  . Smoking status: Never  . Smokeless tobacco: Never  Vaping Use  . Vaping status: Never Used  Substance and Sexual Activity  . Alcohol  use: Yes    Comment: social  . Drug use: No  . Sexual activity: Not Currently    Partners: Male    Birth control/protection: Post-menopausal

## 2023-09-10 ENCOUNTER — Encounter: Payer: Self-pay | Admitting: Sports Medicine

## 2023-10-16 ENCOUNTER — Ambulatory Visit: Admitting: Orthopedic Surgery

## 2023-10-16 ENCOUNTER — Other Ambulatory Visit (INDEPENDENT_AMBULATORY_CARE_PROVIDER_SITE_OTHER): Payer: Self-pay

## 2023-10-16 VITALS — BP 117/78 | HR 60 | Ht 65.0 in | Wt 159.0 lb

## 2023-10-16 DIAGNOSIS — M545 Low back pain, unspecified: Secondary | ICD-10-CM | POA: Diagnosis not present

## 2023-10-16 NOTE — Progress Notes (Signed)
 Orthopedic Spine Surgery Office Note  Assessment: Patient is a 73 y.o. female with bilateral lateral leg pain with activity.  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 Discussed that the conservative treatments include:  -activity modification  -physical therapy  -over the counter pain medications  -medrol dosepak  -lumbar steroid injections - Patient has tried PT, conezymes, increased protein in diet, stopping crestor -Since she is doing better, and recommend no further intervention at this time -Talked about L4/5 injection as a next step if pain does come back or get worse -Patient should return to office on an as-needed basis   Patient expressed understanding of the plan and all questions were answered to the patient's satisfaction.   ___________________________________________________________________________   History:  Patient is a 73 y.o. female who presents today for lumbar spine.  Patient has had about 8 months of bilateral lower extremity pain.  She feels it along the lateral aspect of her legs distal to the knee.  She says she has the pain more when she is active.  She does a lot of hiking.  She hikes multiple miles in a week.  She says she will do up to 30 miles in a week.  She said the pain was worse before and has slowly been getting better.  She has tried decreasing her activity, coming off of Crestor, PT, increase protein intake in her diet.  She feels that her pain has gotten better within the last month.   Weakness: Denies Symptoms of imbalance: Yes, has had chronic unsteadiness for years.  No recent changes.  Does not use any ambulatory assistive devices Paresthesias and numbness: Denies Bowel or bladder incontinence: Denies Saddle anesthesia: Denies  Treatments tried: PT, conezymes, increased protein in  diet, stopping crestor  Review of systems: Denies fevers and chills, night sweats, unexplained weight loss.  Has a history of non-Hodgkin's lymphoma and has had pain that wakes her at night.   Past medical history: Non-Hodgkin's lymphoma in remission AV block Migraine associated vertigo Migraines Angina HTN  Allergies: adhesive tape, iodinated contrast  Past surgical history:  Pacemaker implantation Carpal tunnel release Trigger finger release D&C of uterus  Social history: Denies use of nicotine product (smoking, vaping, patches, smokeless) Alcohol  use: Rare, <1 drink per week Denies recreational drug use   Physical Exam:  BMI of 26.5  General: no acute distress, appears stated age Neurologic: alert, answering questions appropriately, following commands Respiratory: unlabored breathing on room air, symmetric chest rise Psychiatric: appropriate affect, normal cadence to speech   MSK (spine):  -Strength exam      Left  Right EHL    5/5  5/5 TA    5/5  5/5 GSC    5/5  5/5 Knee extension  5/5  5/5 Hip flexion   5/5  5/5  -Sensory exam    Sensation intact to light touch in L3-S1 nerve distributions of bilateral lower extremities  Imaging: XRs of the lumbar spine from 10/16/2023 were independently reviewed and interpreted, showing lumbar scoliosis with apex to the left at L2/3.  Decreased lumbar lordosis.  L LR 24, PI of 60.  No fracture or dislocation seen.  Grade 1 spondylolisthesis at L4/5.  No evidence of instability on flexion/extension views.  MRI of the lumbar spine from 09/03/2023 was independently reviewed and interpreted, showing at L3/4, L4/5, L5/S1.  Right-sided foraminal stenosis at  3/4 and L4/5.  Lateral recess stenosis bilaterally at L4/5.  Grade 1 anterior listhesis at L4/5.   Patient name: Deborah Fisher Patient MRN: [id number] Date of visit: 10/16/23

## 2023-11-01 ENCOUNTER — Ambulatory Visit (HOSPITAL_BASED_OUTPATIENT_CLINIC_OR_DEPARTMENT_OTHER): Payer: Medicare PPO | Admitting: Obstetrics & Gynecology

## 2023-11-14 DIAGNOSIS — E785 Hyperlipidemia, unspecified: Secondary | ICD-10-CM | POA: Diagnosis not present

## 2023-11-14 DIAGNOSIS — M810 Age-related osteoporosis without current pathological fracture: Secondary | ICD-10-CM | POA: Diagnosis not present

## 2023-11-14 DIAGNOSIS — R7301 Impaired fasting glucose: Secondary | ICD-10-CM | POA: Diagnosis not present

## 2023-11-14 DIAGNOSIS — H04123 Dry eye syndrome of bilateral lacrimal glands: Secondary | ICD-10-CM | POA: Diagnosis not present

## 2023-11-14 DIAGNOSIS — Z1389 Encounter for screening for other disorder: Secondary | ICD-10-CM | POA: Diagnosis not present

## 2023-11-14 DIAGNOSIS — H401232 Low-tension glaucoma, bilateral, moderate stage: Secondary | ICD-10-CM | POA: Diagnosis not present

## 2023-11-14 DIAGNOSIS — E7849 Other hyperlipidemia: Secondary | ICD-10-CM | POA: Diagnosis not present

## 2023-11-21 ENCOUNTER — Emergency Department (HOSPITAL_COMMUNITY)
Admission: EM | Admit: 2023-11-21 | Discharge: 2023-11-21 | Disposition: A | Discharge status: Home / Self Care | Attending: Emergency Medicine | Admitting: Emergency Medicine

## 2023-11-21 ENCOUNTER — Encounter (HOSPITAL_COMMUNITY): Payer: Self-pay

## 2023-11-21 ENCOUNTER — Other Ambulatory Visit (HOSPITAL_BASED_OUTPATIENT_CLINIC_OR_DEPARTMENT_OTHER): Payer: Self-pay

## 2023-11-21 ENCOUNTER — Other Ambulatory Visit: Payer: Self-pay

## 2023-11-21 DIAGNOSIS — I10 Essential (primary) hypertension: Secondary | ICD-10-CM | POA: Diagnosis not present

## 2023-11-21 DIAGNOSIS — Z7982 Long term (current) use of aspirin: Secondary | ICD-10-CM | POA: Insufficient documentation

## 2023-11-21 DIAGNOSIS — T783XXA Angioneurotic edema, initial encounter: Secondary | ICD-10-CM | POA: Diagnosis not present

## 2023-11-21 DIAGNOSIS — T782XXA Anaphylactic shock, unspecified, initial encounter: Secondary | ICD-10-CM | POA: Diagnosis not present

## 2023-11-21 DIAGNOSIS — R82998 Other abnormal findings in urine: Secondary | ICD-10-CM | POA: Diagnosis not present

## 2023-11-21 DIAGNOSIS — T7840XA Allergy, unspecified, initial encounter: Secondary | ICD-10-CM | POA: Diagnosis not present

## 2023-11-21 LAB — CBC WITH DIFFERENTIAL/PLATELET
Abs Immature Granulocytes: 0.02 K/uL (ref 0.00–0.07)
Basophils Absolute: 0.1 K/uL (ref 0.0–0.1)
Basophils Relative: 1 %
Eosinophils Absolute: 0.1 K/uL (ref 0.0–0.5)
Eosinophils Relative: 1 %
HCT: 40.1 % (ref 36.0–46.0)
Hemoglobin: 13.6 g/dL (ref 12.0–15.0)
Immature Granulocytes: 0 %
Lymphocytes Relative: 54 %
Lymphs Abs: 3.7 K/uL (ref 0.7–4.0)
MCH: 32.9 pg (ref 26.0–34.0)
MCHC: 33.9 g/dL (ref 30.0–36.0)
MCV: 96.9 fL (ref 80.0–100.0)
Monocytes Absolute: 0.9 K/uL (ref 0.1–1.0)
Monocytes Relative: 13 %
Neutro Abs: 2.2 K/uL (ref 1.7–7.7)
Neutrophils Relative %: 31 %
Platelets: 174 K/uL (ref 150–400)
RBC: 4.14 MIL/uL (ref 3.87–5.11)
RDW: 13.6 % (ref 11.5–15.5)
WBC: 7 K/uL (ref 4.0–10.5)
nRBC: 0 % (ref 0.0–0.2)

## 2023-11-21 LAB — COMPREHENSIVE METABOLIC PANEL WITH GFR
ALT: 31 U/L (ref 0–44)
AST: 50 U/L — ABNORMAL HIGH (ref 15–41)
Albumin: 4.2 g/dL (ref 3.5–5.0)
Alkaline Phosphatase: 65 U/L (ref 38–126)
Anion gap: 14 (ref 5–15)
BUN: 21 mg/dL (ref 8–23)
CO2: 24 mmol/L (ref 22–32)
Calcium: 9.9 mg/dL (ref 8.9–10.3)
Chloride: 99 mmol/L (ref 98–111)
Creatinine, Ser: 1.09 mg/dL — ABNORMAL HIGH (ref 0.44–1.00)
GFR, Estimated: 53 mL/min — ABNORMAL LOW (ref >60)
Glucose, Bld: 109 mg/dL — ABNORMAL HIGH (ref 70–99)
Potassium: 4 mmol/L (ref 3.5–5.1)
Sodium: 137 mmol/L (ref 135–145)
Total Bilirubin: 0.9 mg/dL (ref 0.0–1.2)
Total Protein: 7.3 g/dL (ref 6.5–8.1)

## 2023-11-21 LAB — TYPE AND SCREEN
ABO/RH(D): O POS
Antibody Screen: NEGATIVE

## 2023-11-21 MED ORDER — METHYLPREDNISOLONE SODIUM SUCC 125 MG IJ SOLR
125.0000 mg | Freq: Once | INTRAMUSCULAR | Status: AC
  Administered 2023-11-21: 125 mg via INTRAVENOUS
  Filled 2023-11-21: qty 2

## 2023-11-21 MED ORDER — PREDNISONE 10 MG PO TABS: 40.0000 mg | ORAL_TABLET | Freq: Every day | ORAL | 0 refills | Status: AC

## 2023-11-21 MED ORDER — SODIUM CHLORIDE 0.9 % IV BOLUS
1000.0000 mL | Freq: Once | INTRAVENOUS | Status: AC
  Administered 2023-11-21: 1000 mL via INTRAVENOUS

## 2023-11-21 MED ORDER — FAMOTIDINE IN NACL 20-0.9 MG/50ML-% IV SOLN
20.0000 mg | Freq: Once | INTRAVENOUS | Status: AC
  Administered 2023-11-21: 20 mg via INTRAVENOUS
  Filled 2023-11-21: qty 50

## 2023-11-21 MED ORDER — COMIRNATY 30 MCG/0.3ML IM SUSY
0.3000 mL | PREFILLED_SYRINGE | Freq: Once | INTRAMUSCULAR | 0 refills | Status: AC
Start: 2023-11-21 — End: 2023-11-22
  Filled 2023-11-21: qty 0.3, 1d supply, fill #0

## 2023-11-21 NOTE — ED Triage Notes (Signed)
 Pt BIB [facility] from doctors office. Pt received pfizer covid vaccine at 11am at [employer]. Pt does not report any issues with previous vaccines. At 3pm pt went to PCP office when noticing tongue swelling. PCP office administered epi and solumedrol. EMS administered 50 of benadryl and epi en route.    EMS Vitals 172/78 70 98%

## 2023-11-21 NOTE — ED Provider Notes (Signed)
 [place] EMERGENCY DEPARTMENT AT [facility] Provider Note   CSN: [id number] Arrival date & time: 11/21/23  1620     Patient presents with: Allergic Reaction   Deborah Fisher is a 73 y.o. female.   Patient is a 73 year old female who presents to Emergency Department with a chief complaint of swelling to her tongue.  Patient notes that the swelling began approximately an hour and a half ago.  She notes that she did get her COVID-19 vaccine this morning but denies any other new foods, lotions, deodorants, detergents or medications.  She notes that she is not currently on any blood pressure medications to include an ACE or an ARB.  Patient was scheduled to see her primary care doctor today for a general checkup.  She did go to this appointment shortly after symptoms began and she was given a dose of epinephrine and Benadryl.  Patient denies any history of similar symptoms in the past.  She denies any other secondary rashes or pruritus at this point.  She denies any stridor, dysphagia, drooling, dyspnea.   Allergic Reaction      Prior to Admission medications   Medication Sig Start Date End Date Taking? Authorizing Provider  acetaminophen  (TYLENOL ) 500 MG tablet Take 2 tablets (1,000 mg total) by mouth every 6 (six) hours as needed. Patient taking differently: Take 1,000 mg by mouth every 6 (six) hours as needed for moderate pain (pain score 4-6). 09/24/21   Enedelia Dorna HERO, FNP  alendronate (FOSAMAX) 70 MG tablet Take 1 tablet by mouth once a week. 05/08/21   [provider]  aspirin  81 MG EC tablet TAKE 1 TABLET BY MOUTH EVERY DAY Patient taking differently: Take 81 mg by mouth daily. 04/10/18   Monetta Redell PARAS, MD  brimonidine (ALPHAGAN) 0.2 % ophthalmic solution Place 1 drop into both eyes 2 (two) times daily. 01/28/19   [provider]  Calcium Carbonate-Vitamin D (CALCIUM + D PO) Take 1 tablet by mouth 2 (two) times daily.     [provider]   cetirizine (ZYRTEC) 10 MG tablet Take 10 mg by mouth every evening.    [provider]  Cholecalciferol (VITAMIN D) 50 MCG (2000 UT) CAPS Take 2,000 Units by mouth in the morning and at bedtime.    [provider]  COVID-19 mRNA vaccine, Pfizer, (COMIRNATY ) syringe Inject 0.3 mLs into the muscle once for 1 dose. 11/21/23 11/22/23    Cyanocobalamin (VITAMIN B 12 PO) Take 1 tablet by mouth daily.     [provider]  Fezolinetant  (VEOZAH ) 45 MG TABS Take 1 tablet (45 mg total) by mouth daily. 12/28/22   Cleotilde Ronal RAMAN, MD  gabapentin  (NEURONTIN ) 300 MG capsule Take 2 capsules (600 mg total) by mouth 2 (two) times daily. 04/30/23   Harvey Seltzer, MD  latanoprost (XALATAN) 0.005 % ophthalmic solution Place 1 drop into both eyes at bedtime. 11/23/20   [provider]  Magnesium 200 MG TABS Take 200 mg by mouth at bedtime.    [provider]  Multiple Vitamin (MULTIVITAMIN) tablet Take 1 tablet by mouth daily.    [provider]  nitroGLYCERIN  (NITRODUR - DOSED IN MG/24 HR) 0.2 mg/hr patch CUT PATCH INTO FOURTHS. APPLY 1/4 PATCH TO AFFECTED AREA ON HEEL. CHANGE ONCE DAILY. 07/10/23   Brooks, Dana, DO  nitroGLYCERIN  (NITROSTAT ) 0.4 MG SL tablet Place 1 tablet (0.4 mg total) under the tongue every 5 (five) minutes as needed for chest pain. 07/27/21 08/29/23  Claudene Victory ORN, MD  rizatriptan (MAXALT-MLT) 10 MG disintegrating tablet Take 10 mg by mouth as needed for migraine. 03/12/14   [provider]  rosuvastatin (CRESTOR) 10 MG tablet Take 10 mg by mouth daily. 11/06/22   [provider]  tacrolimus (PROTOPIC) 0.1 % ointment Apply 1 Application topically See admin instructions. 1 application for 5 days on and 2 days off    [provider]    Allergies: Iodinated contrast media and Tape    Review of Systems  HENT:         Tongue swelling  All other systems reviewed and are negative.   Updated Vital Signs BP (!) 153/77 (BP  Location: Right Arm)   Pulse 75   Resp (!) 22   LMP 11/04/1990   SpO2 100%   Physical Exam Vitals and nursing note reviewed.  Constitutional:      General: She is not in acute distress.    Appearance: Normal appearance. She is not ill-appearing.  HENT:     Head: Normocephalic and atraumatic.     Nose: Nose normal.     Mouth/Throat:     Mouth: Mucous membranes are moist.     Comments: Edema noted to the tongue, right greater than left, no peritonsillar swelling, tolerate secretions without difficulty, no stridor, floor mouth is soft Eyes:     Extraocular Movements: Extraocular movements intact.     Conjunctiva/sclera: Conjunctivae normal.     Pupils: Pupils are equal, round, and reactive to light.  Cardiovascular:     Rate and Rhythm: Normal rate and regular rhythm.     Pulses: Normal pulses.     Heart sounds: Normal heart sounds. No murmur heard.    No gallop.  Pulmonary:     Effort: Pulmonary effort is normal. No respiratory distress.     Breath sounds: Normal breath sounds. No stridor. No wheezing or rales.  Abdominal:     General: Abdomen is flat. Bowel sounds are normal. There is no distension.     Palpations: Abdomen is soft.     Tenderness: There is no abdominal tenderness. There is no guarding.  Musculoskeletal:        General: Normal range of motion.     Cervical back: Normal range of motion and neck supple. No rigidity or tenderness.     Right lower leg: No edema.     Left lower leg: No edema.  Skin:    General: Skin is warm and dry.     Coloration: Skin is not jaundiced.     Findings: No bruising or rash.  Neurological:     General: No focal deficit present.     Mental Status: She is alert and oriented to person, place, and time. Mental status is at baseline.  Psychiatric:        Mood and Affect: Mood normal.        Behavior: Behavior normal.        Thought Content: Thought content normal.        Judgment: Judgment normal.     (all labs ordered are  listed, but only abnormal results are displayed) Labs Reviewed  COMPREHENSIVE METABOLIC PANEL WITH GFR  CBC WITH DIFFERENTIAL/PLATELET  TYPE AND SCREEN    EKG: None  Radiology: No results found.   Procedures   Medications Ordered in the ED  methylPREDNISolone  sodium succinate (SOLU-MEDROL ) 125 mg/2 mL injection 125 mg (has no administration in time range)  famotidine  (PEPCID ) IVPB 20 mg premix (20 mg  Intravenous New Bag/Given 11/21/23 1648)  sodium chloride  0.9 % bolus 1,000 mL (1,000 mLs Intravenous New Bag/Given 11/21/23 1641)                                    Medical Decision Making Amount and/or Complexity of Data Reviewed Labs: ordered.  Risk Prescription drug management.   This patient presents to the ED for concern of tongue swelling differential diagnosis includes allergic reaction, angioedema, anaphylaxis    Additional history obtained:  Additional history obtained from medical records External records from outside source obtained and reviewed including medical records   Lab Tests:  I Ordered, and personally interpreted labs.  The pertinent results include: No leukocytosis, no anemia, mild elevation of creatinine, normal liver function, normal electrolytes    Medicines ordered and prescription drug management:  I ordered medication including Pepcid  and Solu-Medrol  for allergic reaction Reevaluation of the patient after these medicines showed that the patient resolved I have reviewed the patients home medicines and have made adjustments as needed   Problem List / ED Course:  Patient is doing very well at this time and is stable for discharge home.  Swelling of the tongue has completely resolved with treatment in the emergency department.  Suspect that this may have been an allergic reaction secondary to the Pfizer COVID-19 vaccine.  Patient is not on any other medications that should have caused angioedema and is not on an ACE or an ARB.  She has had  no indication for anaphylaxis while in the emergency department.  She has been monitored for over 4 hours with no worsening of symptoms.  Do not suspect that admission is warranted at this time.  Close follow-up with PCP was discussed as well as strict turn precautions for any new or worsening symptoms.  Will treat with oral prednisone  over the next few days.  Patient voiced understand to the plan and had no additional questions.  Patient was fully evaluated by attending physician who is in agreement to plan at this time.   Social Determinants of Health:  None        Final diagnoses:  None    ED Discharge Orders     None          Daralene Lonni JONETTA DEVONNA 11/21/23 2053    Darra Fonda MATSU, MD 11/21/23 [phone]

## 2023-11-21 NOTE — Discharge Instructions (Signed)
 Please follow-up closely with your primary care doctor on an outpatient basis.  Return to emergency department immediately for any new or worsening symptoms.

## 2023-11-28 ENCOUNTER — Other Ambulatory Visit: Payer: Self-pay | Admitting: Internal Medicine

## 2023-11-28 DIAGNOSIS — Z0000 Encounter for general adult medical examination without abnormal findings: Secondary | ICD-10-CM

## 2023-11-29 DIAGNOSIS — Z1331 Encounter for screening for depression: Secondary | ICD-10-CM | POA: Diagnosis not present

## 2023-11-29 DIAGNOSIS — Z950 Presence of cardiac pacemaker: Secondary | ICD-10-CM | POA: Diagnosis not present

## 2023-11-29 DIAGNOSIS — E785 Hyperlipidemia, unspecified: Secondary | ICD-10-CM | POA: Diagnosis not present

## 2023-11-29 DIAGNOSIS — M810 Age-related osteoporosis without current pathological fracture: Secondary | ICD-10-CM | POA: Diagnosis not present

## 2023-11-29 DIAGNOSIS — Z87898 Personal history of other specified conditions: Secondary | ICD-10-CM | POA: Diagnosis not present

## 2023-11-29 DIAGNOSIS — E669 Obesity, unspecified: Secondary | ICD-10-CM | POA: Diagnosis not present

## 2023-11-29 DIAGNOSIS — Z0000 Encounter for general adult medical examination without abnormal findings: Secondary | ICD-10-CM | POA: Diagnosis not present

## 2023-11-29 DIAGNOSIS — Z1339 Encounter for screening examination for other mental health and behavioral disorders: Secondary | ICD-10-CM | POA: Diagnosis not present

## 2023-11-29 DIAGNOSIS — G43809 Other migraine, not intractable, without status migrainosus: Secondary | ICD-10-CM | POA: Diagnosis not present

## 2023-11-29 DIAGNOSIS — I442 Atrioventricular block, complete: Secondary | ICD-10-CM | POA: Diagnosis not present

## 2023-11-29 DIAGNOSIS — H40009 Preglaucoma, unspecified, unspecified eye: Secondary | ICD-10-CM | POA: Diagnosis not present

## 2023-11-29 DIAGNOSIS — I201 Angina pectoris with documented spasm: Secondary | ICD-10-CM | POA: Diagnosis not present

## 2023-11-29 DIAGNOSIS — Z23 Encounter for immunization: Secondary | ICD-10-CM | POA: Diagnosis not present

## 2023-11-29 DIAGNOSIS — G4700 Insomnia, unspecified: Secondary | ICD-10-CM | POA: Diagnosis not present

## 2023-12-03 ENCOUNTER — Ambulatory Visit: Payer: Medicare PPO

## 2023-12-03 DIAGNOSIS — I442 Atrioventricular block, complete: Secondary | ICD-10-CM | POA: Diagnosis not present

## 2023-12-04 ENCOUNTER — Ambulatory Visit: Payer: Self-pay | Admitting: Internal Medicine

## 2023-12-04 LAB — CUP PACEART REMOTE DEVICE CHECK
Battery Remaining Longevity: 134 mo
Battery Voltage: 3.01 V
Brady Statistic AP VP Percent: 47.54 %
Brady Statistic AP VS Percent: 0 %
Brady Statistic AS VP Percent: 52.41 %
Brady Statistic AS VS Percent: 0.04 %
Brady Statistic RA Percent Paced: 47.53 %
Brady Statistic RV Percent Paced: 99.96 %
Date Time Interrogation Session: 20250930002510
Implantable Lead Connection Status: 753985
Implantable Lead Connection Status: 753985
Implantable Lead Implant Date: 20240701
Implantable Lead Implant Date: 20240701
Implantable Lead Location: 753859
Implantable Lead Location: 753860
Implantable Lead Model: 3830
Implantable Lead Model: 5076
Implantable Pulse Generator Implant Date: 20240701
Lead Channel Impedance Value: 323 Ohm
Lead Channel Impedance Value: 342 Ohm
Lead Channel Impedance Value: 437 Ohm
Lead Channel Impedance Value: 589 Ohm
Lead Channel Pacing Threshold Amplitude: 0.375 V
Lead Channel Pacing Threshold Amplitude: 0.75 V
Lead Channel Pacing Threshold Pulse Width: 0.4 ms
Lead Channel Pacing Threshold Pulse Width: 0.4 ms
Lead Channel Sensing Intrinsic Amplitude: 21.75 mV
Lead Channel Sensing Intrinsic Amplitude: 21.75 mV
Lead Channel Sensing Intrinsic Amplitude: 4.625 mV
Lead Channel Sensing Intrinsic Amplitude: 4.625 mV
Lead Channel Setting Pacing Amplitude: 1.5 V
Lead Channel Setting Pacing Amplitude: 2 V
Lead Channel Setting Pacing Pulse Width: 0.4 ms
Lead Channel Setting Sensing Sensitivity: 1.2 mV
Zone Setting Status: 755011

## 2023-12-05 NOTE — Progress Notes (Signed)
 Remote PPM Transmission

## 2023-12-11 ENCOUNTER — Ambulatory Visit

## 2023-12-12 NOTE — Progress Notes (Signed)
 Remote PPM Transmission

## 2023-12-13 ENCOUNTER — Ambulatory Visit
Admission: RE | Admit: 2023-12-13 | Discharge: 2023-12-13 | Disposition: A | Discharge status: Home / Self Care | Source: Ambulatory Visit | Attending: Internal Medicine | Admitting: Internal Medicine

## 2023-12-13 DIAGNOSIS — Z1231 Encounter for screening mammogram for malignant neoplasm of breast: Secondary | ICD-10-CM | POA: Diagnosis not present

## 2023-12-13 DIAGNOSIS — Z0000 Encounter for general adult medical examination without abnormal findings: Secondary | ICD-10-CM

## 2023-12-16 DIAGNOSIS — L6681 Central centrifugal cicatricial alopecia: Secondary | ICD-10-CM | POA: Diagnosis not present

## 2023-12-16 DIAGNOSIS — L6612 Frontal fibrosing alopecia: Secondary | ICD-10-CM | POA: Diagnosis not present

## 2024-01-04 ENCOUNTER — Other Ambulatory Visit (HOSPITAL_BASED_OUTPATIENT_CLINIC_OR_DEPARTMENT_OTHER): Payer: Self-pay | Admitting: Obstetrics & Gynecology

## 2024-01-04 DIAGNOSIS — M4800 Spinal stenosis, site unspecified: Secondary | ICD-10-CM

## 2024-01-06 ENCOUNTER — Encounter: Payer: Self-pay | Admitting: Radiology

## 2024-01-15 ENCOUNTER — Other Ambulatory Visit (HOSPITAL_BASED_OUTPATIENT_CLINIC_OR_DEPARTMENT_OTHER): Payer: Self-pay | Admitting: Obstetrics & Gynecology

## 2024-01-15 DIAGNOSIS — N951 Menopausal and female climacteric states: Secondary | ICD-10-CM

## 2024-01-16 DIAGNOSIS — M810 Age-related osteoporosis without current pathological fracture: Secondary | ICD-10-CM | POA: Diagnosis not present

## 2024-01-17 ENCOUNTER — Other Ambulatory Visit (HOSPITAL_BASED_OUTPATIENT_CLINIC_OR_DEPARTMENT_OTHER): Payer: Self-pay | Admitting: Obstetrics & Gynecology

## 2024-01-17 DIAGNOSIS — R748 Abnormal levels of other serum enzymes: Secondary | ICD-10-CM

## 2024-01-24 NOTE — Telephone Encounter (Signed)
 LMOM at 12:12 for patient to come by office to have labs drawn. We will call in Rx after labs have been reviewed. tbw

## 2024-01-27 ENCOUNTER — Other Ambulatory Visit (HOSPITAL_BASED_OUTPATIENT_CLINIC_OR_DEPARTMENT_OTHER): Payer: Self-pay

## 2024-01-27 ENCOUNTER — Telehealth (HOSPITAL_BASED_OUTPATIENT_CLINIC_OR_DEPARTMENT_OTHER): Payer: Self-pay

## 2024-01-27 DIAGNOSIS — R748 Abnormal levels of other serum enzymes: Secondary | ICD-10-CM

## 2024-01-27 NOTE — Telephone Encounter (Signed)
 Left a voicemail informing the patient that the bloodwork order has been placed. Requested that she come in at her earliest convenience to have labs drawn so Dr. Cleotilde can determine whether her prescription can be refilled.

## 2024-01-28 ENCOUNTER — Other Ambulatory Visit (HOSPITAL_BASED_OUTPATIENT_CLINIC_OR_DEPARTMENT_OTHER): Payer: Self-pay

## 2024-01-28 ENCOUNTER — Ambulatory Visit (HOSPITAL_BASED_OUTPATIENT_CLINIC_OR_DEPARTMENT_OTHER): Payer: Self-pay | Admitting: Obstetrics & Gynecology

## 2024-01-28 ENCOUNTER — Encounter (HOSPITAL_BASED_OUTPATIENT_CLINIC_OR_DEPARTMENT_OTHER): Payer: Self-pay | Admitting: Obstetrics & Gynecology

## 2024-01-28 DIAGNOSIS — N951 Menopausal and female climacteric states: Secondary | ICD-10-CM

## 2024-01-28 LAB — COMPREHENSIVE METABOLIC PANEL WITH GFR
ALT: 27 IU/L (ref 0–32)
AST: 43 IU/L — ABNORMAL HIGH (ref 0–40)
Albumin: 4.4 g/dL (ref 3.8–4.8)
Alkaline Phosphatase: 77 IU/L (ref 49–135)
BUN/Creatinine Ratio: 19 (ref 12–28)
BUN: 19 mg/dL (ref 8–27)
Bilirubin Total: 0.4 mg/dL (ref 0.0–1.2)
CO2: 24 mmol/L (ref 20–29)
Calcium: 10.1 mg/dL (ref 8.7–10.3)
Chloride: 99 mmol/L (ref 96–106)
Creatinine, Ser: 1 mg/dL (ref 0.57–1.00)
Globulin, Total: 2.3 g/dL (ref 1.5–4.5)
Glucose: 87 mg/dL (ref 70–99)
Potassium: 5 mmol/L (ref 3.5–5.2)
Sodium: 138 mmol/L (ref 134–144)
Total Protein: 6.7 g/dL (ref 6.0–8.5)
eGFR: 59 mL/min/1.73 — ABNORMAL LOW (ref >59)

## 2024-01-28 MED ORDER — VEOZAH 45 MG PO TABS: 45.0000 mg | ORAL_TABLET | Freq: Every day | ORAL | 0 refills | Status: AC

## 2024-02-11 ENCOUNTER — Ambulatory Visit (HOSPITAL_BASED_OUTPATIENT_CLINIC_OR_DEPARTMENT_OTHER): Admitting: Obstetrics & Gynecology

## 2024-02-17 NOTE — Progress Notes (Unsigned)
 Patient name: Deborah Fisher MRN: [id number] DOB: [birth year] Sex: female  REASON FOR CONSULT: Lower extremity fatigue, evaluate for PVD  HPI: Deborah Fisher is a 73 y.o. female, with history of hypertension and non-Hodgkin's lymphoma that presents for evaluation of lower extremity fatigue and possible PVD.  She was evaluated by Dr. Darnella with neurosurgery who did not feel that she had any back pathology to explain her leg fatigue.  Patient had ABIs on 05/20/2023 that were 1.29 on the right triphasic and 1.25 on the left triphasic and both within normal range.  Past Medical History:  Diagnosis Date   Anginal pain    Bursitis    left hip    Chronic constipation    Coronary artery disease 03/2019   1st and 2nd degree AV   DI (detrusor instability)    Femur fracture (HCC) 2011   Fibroid    Headache    Heart block AV third degree (HCC)    Hypertension 03/05/2022   Most recent measurements are higher than usual.   Menopausal symptoms    Non-Hodgkin lymphoma (HCC) 1992   treated with CHOP   Osteoporosis    h/o 3 stress fractures   Syncope and collapse 06/18/2022   Near blackout at end of hike.   Tennis elbow Right    Trigger finger    bilateral     Past Surgical History:  Procedure Laterality Date   Biopsy for Non Hodgkins Lymphoma  03/05/1990   CARPAL TUNNEL RELEASE Right 09/11/2019   Procedure: CARPAL TUNNEL RELEASE;  Surgeon: Murrell Drivers, MD;  Location: [facility];  Service: Orthopedics;  Laterality: Right;   DILATION AND CURETTAGE OF UTERUS     PACEMAKER IMPLANT N/A 09/03/2022   Procedure: PACEMAKER IMPLANT;  Surgeon: Waddell Danelle ORN, MD;  Location: [facility];  Service: Cardiovascular;  Laterality: N/A;   PELVIC LAPAROSCOPY     TOE SURGERY     TRIGGER FINGER RELEASE Left 08/04/2018   Procedure: RELEASE TRIGGER FINGER/A-1 PULLEY;  Surgeon: Murrell Drivers, MD;  Location: [facility];  Service: Orthopedics;  Laterality: Left;     Family History  Problem Relation Age of Onset   Diabetes Mother 54   Hypertension Mother    Alzheimer's disease Mother    Glaucoma Mother    Heart disease Mother    Hypertension Father    Alcoholism Father        deceased age 69   Cirrhosis Father    Diabetes Brother    Hypertension Brother    Asthma Brother        Childhood   Colon cancer Maternal Uncle    Heart disease Maternal Grandmother    Arrhythmia Maternal Grandmother        No sure.   Heart attack Maternal Grandmother        Passed away at age 105 from heart attack.   Breast cancer Cousin        Mat. 1st cousin-Age 61   Breast cancer Maternal Aunt        Age 86's   Heart disease Maternal Aunt    Other Paternal Grandmother        MI   Arrhythmia Maternal Aunt        No sure.   Heart disease Maternal Aunt        Angina    SOCIAL HISTORY: Social History   Socioeconomic History   Marital status: Single    Spouse name: Not  on file   Number of children: 0   Years of education: PhD   Highest education level: Not on file  Occupational History    Employer: [employer]    Comment: Ass. Dean and Professor  Tobacco Use   Smoking status: Never   Smokeless tobacco: Never  Vaping Use   Vaping status: Never Used  Substance and Sexual Activity   Alcohol  use: Yes    Comment: social   Drug use: No   Sexual activity: Not Currently    Partners: Male    Birth control/protection: Post-menopausal  Other Topics Concern   Not on file  Social History Narrative   Not on file   Social Drivers of Health   Tobacco Use: Low Risk (12/16/2023)   Received from [facility]   Patient History    Passive Exposure: Not on file    Smokeless Tobacco Use: Never    Smoking Tobacco Use: Never  Financial Resource Strain: Not on file  Food Insecurity: Not on file  Transportation Needs: Not on file  Physical Activity: Not on file  Stress: Not on file  Social Connections: Not on file  Intimate Partner Violence: Not on file   Depression (PHQ2-9): Low Risk (12/28/2022)   Depression (PHQ2-9)    PHQ-2 Score: 0  Alcohol  Screen: Not on file  Housing: Not on file  Utilities: Not on file  Health Literacy: Not on file    Allergies[1]  Current Outpatient Medications  Medication Sig Dispense Refill   acetaminophen  (TYLENOL ) 500 MG tablet Take 2 tablets (1,000 mg total) by mouth every 6 (six) hours as needed. (Patient taking differently: Take 1,000 mg by mouth every 6 (six) hours as needed for moderate pain (pain score 4-6).) 30 tablet 0   alendronate (FOSAMAX) 70 MG tablet Take 1 tablet by mouth once a week.     aspirin  81 MG EC tablet TAKE 1 TABLET BY MOUTH EVERY DAY (Patient taking differently: Take 81 mg by mouth daily.) 30 tablet 1   brimonidine (ALPHAGAN) 0.2 % ophthalmic solution Place 1 drop into both eyes 2 (two) times daily.     Calcium Carbonate-Vitamin D (CALCIUM + D PO) Take 1 tablet by mouth 2 (two) times daily.      cetirizine (ZYRTEC) 10 MG tablet Take 10 mg by mouth every evening.     Cholecalciferol (VITAMIN D) 50 MCG (2000 UT) CAPS Take 2,000 Units by mouth in the morning and at bedtime.     Cyanocobalamin (VITAMIN B 12 PO) Take 1 tablet by mouth daily.      Fezolinetant  (VEOZAH ) 45 MG TABS Take 1 tablet (45 mg total) by mouth daily. 90 tablet 0   gabapentin  (NEURONTIN ) 300 MG capsule TAKE 1 PILL IN THE MORNING (300 MG). TAKE 2 PILLS IN THE EVENING (600 MG). 90 capsule 11   latanoprost (XALATAN) 0.005 % ophthalmic solution Place 1 drop into both eyes at bedtime.     Magnesium 200 MG TABS Take 200 mg by mouth at bedtime.     Multiple Vitamin (MULTIVITAMIN) tablet Take 1 tablet by mouth daily.     nitroGLYCERIN  (NITRODUR - DOSED IN MG/24 HR) 0.2 mg/hr patch CUT PATCH INTO FOURTHS. APPLY 1/4 PATCH TO AFFECTED AREA ON HEEL. CHANGE ONCE DAILY. 90 patch 1   nitroGLYCERIN  (NITROSTAT ) 0.4 MG SL tablet Place 1 tablet (0.4 mg total) under the tongue every 5 (five) minutes as needed for chest pain. 25 tablet 3    rizatriptan (MAXALT-MLT) 10 MG disintegrating tablet Take  10 mg by mouth as needed for migraine.     rosuvastatin (CRESTOR) 10 MG tablet Take 10 mg by mouth daily.     tacrolimus (PROTOPIC) 0.1 % ointment Apply 1 Application topically See admin instructions. 1 application for 5 days on and 2 days off     No current facility-administered medications for this visit.    REVIEW OF SYSTEMS:  [X]  denotes positive finding, [ ]  denotes negative finding Cardiac  Comments:  Chest pain or chest pressure: ***   Shortness of breath upon exertion:    Short of breath when lying flat:    Irregular heart rhythm:        Vascular    Pain in calf, thigh, or hip brought on by ambulation:    Pain in feet at night that wakes you up from your sleep:     Blood clot in your veins:    Leg swelling:         Pulmonary    Oxygen at home:    Productive cough:     Wheezing:         Neurologic    Sudden weakness in arms or legs:     Sudden numbness in arms or legs:     Sudden onset of difficulty speaking or slurred speech:    Temporary loss of vision in one eye:     Problems with dizziness:         Gastrointestinal    Blood in stool:     Vomited blood:         Genitourinary    Burning when urinating:     Blood in urine:        Psychiatric    Major depression:         Hematologic    Bleeding problems:    Problems with blood clotting too easily:        Skin    Rashes or ulcers:        Constitutional    Fever or chills:      PHYSICAL EXAM: There were no vitals filed for this visit.  GENERAL: The patient is a well-nourished female, in no acute distress. The vital signs are documented above. CARDIAC: There is a regular rate and rhythm.  VASCULAR: *** PULMONARY: There is good air exchange bilaterally without wheezing or rales. ABDOMEN: Soft and non-tender with normal pitched bowel sounds.  MUSCULOSKELETAL: There are no major deformities or cyanosis. NEUROLOGIC: No focal weakness or  paresthesias are detected. SKIN: There are no ulcers or rashes noted. PSYCHIATRIC: The patient has a normal affect.  DATA:   ABIs on 05/20/2023 that were 1.29 on the right triphasic and 1.25 on the left triphasic and both within normal range.  Assessment/Plan:  73 y.o. female, with history of hypertension and non-Hodgkin's lymphoma that presents for evaluation of lower extremity fatigue and possible PVD.  She was evaluated by Dr. Darnella with neurosurgery who did not feel that she had any back pathology to explain her leg fatigue.  Patient had ABIs on 05/20/2023 that were 1.29 on the right triphasic and 1.25 on the left triphasic and both within normal range.   Lonni DOROTHA Gaskins, MD [facility] Office: [phone]        [1]  Allergies Allergen Reactions   Iodinated Contrast Media Other (See Comments)     Warm feeling once   Tape Rash

## 2024-02-18 ENCOUNTER — Other Ambulatory Visit: Payer: Self-pay

## 2024-02-18 ENCOUNTER — Ambulatory Visit: Attending: Vascular Surgery | Admitting: Vascular Surgery

## 2024-02-18 ENCOUNTER — Ambulatory Visit (HOSPITAL_COMMUNITY): Admission: RE | Admit: 2024-02-18 | Discharge: 2024-02-18 | Attending: Vascular Surgery

## 2024-02-18 ENCOUNTER — Encounter: Payer: Self-pay | Admitting: Vascular Surgery

## 2024-02-18 VITALS — BP 132/79 | HR 60 | Temp 98.0°F | Resp 20 | Ht 65.0 in | Wt 169.0 lb

## 2024-02-18 DIAGNOSIS — I739 Peripheral vascular disease, unspecified: Secondary | ICD-10-CM | POA: Insufficient documentation

## 2024-02-18 DIAGNOSIS — R29898 Other symptoms and signs involving the musculoskeletal system: Secondary | ICD-10-CM | POA: Diagnosis not present

## 2024-02-18 LAB — VAS US ABI WITH/WO TBI
Left ABI: 1.15
Right ABI: 1.19

## 2024-02-24 ENCOUNTER — Encounter: Payer: Self-pay | Admitting: Sports Medicine

## 2024-02-24 ENCOUNTER — Ambulatory Visit: Admitting: Sports Medicine

## 2024-02-24 DIAGNOSIS — M51369 Other intervertebral disc degeneration, lumbar region without mention of lumbar back pain or lower extremity pain: Secondary | ICD-10-CM

## 2024-02-24 DIAGNOSIS — M48062 Spinal stenosis, lumbar region with neurogenic claudication: Secondary | ICD-10-CM

## 2024-02-24 DIAGNOSIS — R252 Cramp and spasm: Secondary | ICD-10-CM

## 2024-02-24 DIAGNOSIS — M4150 Other secondary scoliosis, site unspecified: Secondary | ICD-10-CM

## 2024-02-24 NOTE — Progress Notes (Signed)
 Patient says that she is still having fatigue and weakness in both of her legs. She has been seen at neurosurgery and vascular surgery with normal testing. She would like to move forward with injection for her back, as advised by Dr. Georgina, as well as physical therapy that is targeted for her back and diagnosis of foraminal stenosis.
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
## 2024-02-25 ENCOUNTER — Encounter: Payer: Self-pay | Admitting: Internal Medicine

## 2024-03-02 ENCOUNTER — Encounter (HOSPITAL_BASED_OUTPATIENT_CLINIC_OR_DEPARTMENT_OTHER): Payer: Self-pay

## 2024-03-02 ENCOUNTER — Other Ambulatory Visit (HOSPITAL_BASED_OUTPATIENT_CLINIC_OR_DEPARTMENT_OTHER)

## 2024-03-02 ENCOUNTER — Other Ambulatory Visit (HOSPITAL_BASED_OUTPATIENT_CLINIC_OR_DEPARTMENT_OTHER): Payer: Self-pay

## 2024-03-02 DIAGNOSIS — Z79899 Other long term (current) drug therapy: Secondary | ICD-10-CM

## 2024-03-02 DIAGNOSIS — R748 Abnormal levels of other serum enzymes: Secondary | ICD-10-CM

## 2024-03-02 LAB — HEPATIC FUNCTION PANEL
ALT: 31 IU/L (ref 0–32)
AST: 40 IU/L (ref 0–40)
Albumin: 4.5 g/dL (ref 3.8–4.8)
Alkaline Phosphatase: 86 IU/L (ref 49–135)
Bilirubin Total: 0.3 mg/dL (ref 0.0–1.2)
Bilirubin, Direct: 0.13 mg/dL (ref 0.00–0.40)
Total Protein: 6.9 g/dL (ref 6.0–8.5)

## 2024-03-02 NOTE — Progress Notes (Signed)
 Patient came in for blood draw today. Patient is currently on [place]oz[place] . Checking liver enzymes. tbw

## 2024-03-03 ENCOUNTER — Ambulatory Visit (INDEPENDENT_AMBULATORY_CARE_PROVIDER_SITE_OTHER): Payer: Medicare PPO

## 2024-03-03 DIAGNOSIS — I739 Peripheral vascular disease, unspecified: Secondary | ICD-10-CM | POA: Diagnosis not present

## 2024-03-04 LAB — CUP PACEART REMOTE DEVICE CHECK
Battery Remaining Longevity: 131 mo
Battery Voltage: 3 V
Brady Statistic AP VP Percent: 49.92 %
Brady Statistic AP VS Percent: 0 %
Brady Statistic AS VP Percent: 50.03 %
Brady Statistic AS VS Percent: 0.04 %
Brady Statistic RA Percent Paced: 49.94 %
Brady Statistic RV Percent Paced: 99.96 %
Date Time Interrogation Session: 20251229201647
Implantable Lead Connection Status: 753985
Implantable Lead Connection Status: 753985
Implantable Lead Implant Date: 20240701
Implantable Lead Implant Date: 20240701
Implantable Lead Location: 753859
Implantable Lead Location: 753860
Implantable Lead Model: 3830
Implantable Lead Model: 5076
Implantable Pulse Generator Implant Date: 20240701
Lead Channel Impedance Value: 323 Ohm
Lead Channel Impedance Value: 342 Ohm
Lead Channel Impedance Value: 456 Ohm
Lead Channel Impedance Value: 570 Ohm
Lead Channel Pacing Threshold Amplitude: 0.375 V
Lead Channel Pacing Threshold Amplitude: 0.625 V
Lead Channel Pacing Threshold Pulse Width: 0.4 ms
Lead Channel Pacing Threshold Pulse Width: 0.4 ms
Lead Channel Sensing Intrinsic Amplitude: 17.5 mV
Lead Channel Sensing Intrinsic Amplitude: 17.5 mV
Lead Channel Sensing Intrinsic Amplitude: 4 mV
Lead Channel Sensing Intrinsic Amplitude: 4 mV
Lead Channel Setting Pacing Amplitude: 1.5 V
Lead Channel Setting Pacing Amplitude: 2 V
Lead Channel Setting Pacing Pulse Width: 0.4 ms
Lead Channel Setting Sensing Sensitivity: 1.2 mV
Zone Setting Status: 755011

## 2024-03-06 ENCOUNTER — Encounter (HOSPITAL_BASED_OUTPATIENT_CLINIC_OR_DEPARTMENT_OTHER): Payer: Self-pay | Admitting: Obstetrics & Gynecology

## 2024-03-07 ENCOUNTER — Ambulatory Visit (HOSPITAL_BASED_OUTPATIENT_CLINIC_OR_DEPARTMENT_OTHER): Payer: Self-pay | Admitting: Obstetrics & Gynecology

## 2024-03-08 ENCOUNTER — Ambulatory Visit: Payer: Self-pay | Admitting: Cardiology

## 2024-03-08 ENCOUNTER — Other Ambulatory Visit (HOSPITAL_BASED_OUTPATIENT_CLINIC_OR_DEPARTMENT_OTHER): Payer: Self-pay | Admitting: Obstetrics & Gynecology

## 2024-03-08 DIAGNOSIS — Z79899 Other long term (current) drug therapy: Secondary | ICD-10-CM

## 2024-03-10 ENCOUNTER — Other Ambulatory Visit: Payer: Self-pay

## 2024-03-10 DIAGNOSIS — I739 Peripheral vascular disease, unspecified: Secondary | ICD-10-CM

## 2024-03-11 ENCOUNTER — Ambulatory Visit: Admitting: Sports Medicine

## 2024-03-11 NOTE — Progress Notes (Signed)
 Remote PPM Transmission

## 2024-03-19 ENCOUNTER — Other Ambulatory Visit: Payer: Self-pay

## 2024-03-19 ENCOUNTER — Ambulatory Visit: Admitting: Physical Medicine and Rehabilitation

## 2024-03-19 VITALS — BP 120/69 | HR 64

## 2024-03-19 DIAGNOSIS — M5416 Radiculopathy, lumbar region: Secondary | ICD-10-CM

## 2024-03-19 MED ORDER — METHYLPREDNISOLONE ACETATE 40 MG/ML IJ SUSP
40.0000 mg | Freq: Once | INTRAMUSCULAR | Status: AC
  Administered 2024-03-19: 40 mg

## 2024-03-19 NOTE — Progress Notes (Signed)
+  driver +aspirin  Low back pain with bilateral LE weakness

## 2024-03-19 NOTE — Progress Notes (Signed)
 "  Deborah Fisher - 74 y.o. female MRN [id number]  Date of birth: December 12, 1950  Office Visit Note: Visit Date: 03/19/2024 PCP: Loreli Elsie JONETTA Mickey., MD Referred by: Loreli Elsie JONETTA Mickey., MD  Subjective: Chief Complaint  Patient presents with   Lower Back - Pain   HPI:  Deborah Fisher is a 74 y.o. female who comes in today at the request of Dr. Lonell Sprang for planned Left L4-5 Lumbar Interlaminar epidural steroid injection with fluoroscopic guidance.  The patient has failed conservative care including home exercise, medications, time and activity modification.  This injection will be diagnostic and hopefully therapeutic.  Please see requesting physician notes for further details and justification.   ROS Otherwise per HPI.  Assessment & Plan: Visit Diagnoses:    ICD-10-CM   1. Lumbar radiculopathy  M54.16 XR C-ARM NO REPORT    Epidural Steroid injection    methylPREDNISolone  acetate (DEPO-MEDROL ) injection 40 mg      Plan: No additional findings.   Meds & Orders:  Meds ordered this encounter  Medications   methylPREDNISolone  acetate (DEPO-MEDROL ) injection 40 mg    Orders Placed This Encounter  Procedures   XR C-ARM NO REPORT   Epidural Steroid injection    Follow-up: Return for visit to requesting provider as needed.   Procedures: No procedures performed  Lumbar Epidural Steroid Injection - Interlaminar Approach with Fluoroscopic Guidance  Patient: Deborah Fisher      Date of Birth: [birth year] MRN: [id number] PCP: Loreli Elsie JONETTA Mickey., MD      Visit Date: 03/19/2024   Universal Protocol:     Consent Given By: the patient  Position: PRONE  Additional Comments: Vital signs were monitored before and after the procedure. Patient was prepped and draped in the usual sterile fashion. The correct patient, procedure, and site was verified.   Injection Procedure Details:   Procedure diagnoses: Lumbar radiculopathy [M54.16]   Meds Administered:  Meds ordered this  encounter  Medications   methylPREDNISolone  acetate (DEPO-MEDROL ) injection 40 mg     Laterality: Left  Location/Site:  L4-5  Needle: 3.5 in., 20 ga. Tuohy  Needle Placement: Paramedian epidural  Findings:   -Comments: Excellent flow of contrast into the epidural space.  Procedure Details: Using a paramedian approach from the side mentioned above, the region overlying the inferior lamina was localized under fluoroscopic visualization and the soft tissues overlying this structure were infiltrated with 4 ml. of 1% Lidocaine  without Epinephrine. The Tuohy needle was inserted into the epidural space using a paramedian approach.   The epidural space was localized using loss of resistance along with counter oblique bi-planar fluoroscopic views.  After negative aspirate for air, blood, and CSF, a 2 ml. volume of Isovue -250 was injected into the epidural space and the flow of contrast was observed. Radiographs were obtained for documentation purposes.    The injectate was administered into the level noted above.   Additional Comments:  The patient tolerated the procedure well Dressing: 2 x 2 sterile gauze and Band-Aid    Post-procedure details: Patient was observed during the procedure. Post-procedure instructions were reviewed.  Patient left the clinic in stable condition.   Clinical History: MR LUMBAR SPINE WITHOUT IV CONTRAST   COMPARISON: X-ray 02/12/2019 prior MRI 06/25/1998   CLINICAL HISTORY: Spinal stenosis, lumbar region.   TECHNIQUE: SAG T2, SAG T1, SAG STIR, AX T2, AX T1 without IV contrast.   FINDINGS: There is degenerative spondylosis with moderate levoscoliosis of the lumbar spine. Multilevel disc  desiccation and facet arthrosis is present. There is mild grade 1 anterior spondylolisthesis of L3 on 4 and L4 on 5 secondary to facet arthrosis. There is no vertebral body height loss, subluxation or marrow replacing process. The sacrum and SI joints are  unremarkable so far as visualized. Conus and cauda equina are unremarkable.   T12-L1: There is no focal disc protrusion, foraminal or spinal stenosis. Mild facet arthrosis is present bilaterally.   L1-2: There is no focal disc protrusion, foraminal or spinal stenosis. Mild facet arthrosis is present bilaterally.   L2-3: Mild disc desiccation and mild-to-moderate facet arthrosis. No significant foraminal or spinal stenosis cruciate.   L3-4: There is a broad-based disc osteophyte moderate to severe facet arthrosis resulting in mild grade 1 anterior spondylolisthesis. There is severe right foraminal stenosis likely impinging the exiting right L3 nerve. No significant left foraminal stenosis is present. There is slight crowding of the descending nerve roots in the lateral recess without impingement.   L4-5: Mild grade 1 anterior spondylolisthesis secondary to moderate to severe facet arthrosis. There is severe bilateral foraminal narrowing, right greater than left. There is suggestion of a right foraminal extrusion seen on sagittal image 14 and axial image 26 and 27. Is not definitive. This could be related to a slightly enlarged nerve root. Follow-up MRI with contrast if it would change management.   L5-S1: Disc desiccation and mild facet arthrosis. There is moderate left foraminal stenosis. Correlation for left L5 radiculopathy. Transitional vertebral levels are present bilaterally, right greater than left. Correlation for symptoms of Bertolotti syndrome.   Benign cyst is identified in the posterior right kidney. Otherwise, the retroperitoneal structures are unremarkable.   IMPRESSION: Moderate degenerative spondylosis and levoscoliosis with multilevel foraminal and spinal stenosis as described in detail by level above. There is a possible congenital anomaly at L3 with possible block vertebral body.   Mild grade 1 anterior spondylolisthesis is seen at L3-4 and L4-5.  Severe foraminal stenosis is seen on the right at L4-5 and L3-4.   Mild spinal stenosis without high-grade spinal canal narrowing.   There is suggestion of a either prominent dorsal nerve root ganglion or extruded disc fragment at the L4-5 level on the left as above. Correlation for symptoms of left L4 radiculopathy. Follow-up with contrasted symptoms warrant.   At L5-S1, there are transitional vertebral levels are present bilaterally, right greater than left. Correlation for symptoms of Bertolotti syndrome.   Electronically signed by: Norleen Satchel MD 09/10/2023 10:44 AM EDT RP Workstation: [id number]     Objective:  VS:  HT:    WT:   BMI:     BP:120/69  HR:64bpm  TEMP: ( )  RESP:  Physical Exam Vitals and nursing note reviewed.  Constitutional:      General: She is not in acute distress.    Appearance: Normal appearance. She is not ill-appearing.  HENT:     Head: Normocephalic and atraumatic.     Right Ear: External ear normal.     Left Ear: External ear normal.  Eyes:     Extraocular Movements: Extraocular movements intact.  Cardiovascular:     Rate and Rhythm: Normal rate.     Pulses: Normal pulses.  Pulmonary:     Effort: Pulmonary effort is normal. No respiratory distress.  Abdominal:     General: There is no distension.     Palpations: Abdomen is soft.  Musculoskeletal:        General: Tenderness present.     Cervical back: Neck  supple.     Right lower leg: No edema.     Left lower leg: No edema.     Comments: Patient has good distal strength with no pain over the greater trochanters.  No clonus or focal weakness.  Skin:    Findings: No erythema, lesion or rash.  Neurological:     General: No focal deficit present.     Mental Status: She is alert and oriented to person, place, and time.     Sensory: No sensory deficit.     Motor: No weakness or abnormal muscle tone.     Coordination: Coordination normal.  Psychiatric:        Mood and Affect: Mood  normal.        Behavior: Behavior normal.      Imaging: No results found. "

## 2024-03-24 ENCOUNTER — Ambulatory Visit (HOSPITAL_COMMUNITY)
Admission: RE | Admit: 2024-03-24 | Discharge: 2024-03-24 | Disposition: A | Discharge status: Home / Self Care | Source: Ambulatory Visit | Attending: Surgery | Admitting: Surgery

## 2024-03-24 DIAGNOSIS — I739 Peripheral vascular disease, unspecified: Secondary | ICD-10-CM | POA: Insufficient documentation

## 2024-03-24 LAB — VAS US LOWER EXTREMITY EXERCISE
Left ABI: 1.15
Right ABI: 1.29

## 2024-03-30 NOTE — Procedures (Signed)
 Lumbar Epidural Steroid Injection - Interlaminar Approach with Fluoroscopic Guidance  Patient: Deborah Fisher      Date of Birth: 07-11-1950 MRN: [id number] PCP: Loreli Elsie JONETTA Mickey., MD      Visit Date: 03/19/2024   Universal Protocol:     Consent Given By: the patient  Position: PRONE  Additional Comments: Vital signs were monitored before and after the procedure. Patient was prepped and draped in the usual sterile fashion. The correct patient, procedure, and site was verified.   Injection Procedure Details:   Procedure diagnoses: Lumbar radiculopathy [M54.16]   Meds Administered:  Meds ordered this encounter  Medications   methylPREDNISolone  acetate (DEPO-MEDROL ) injection 40 mg     Laterality: Left  Location/Site:  L4-5  Needle: 3.5 in., 20 ga. Tuohy  Needle Placement: Paramedian epidural  Findings:   -Comments: Excellent flow of contrast into the epidural space.  Procedure Details: Using a paramedian approach from the side mentioned above, the region overlying the inferior lamina was localized under fluoroscopic visualization and the soft tissues overlying this structure were infiltrated with 4 ml. of 1% Lidocaine  without Epinephrine. The Tuohy needle was inserted into the epidural space using a paramedian approach.   The epidural space was localized using loss of resistance along with counter oblique bi-planar fluoroscopic views.  After negative aspirate for air, blood, and CSF, a 2 ml. volume of Isovue -250 was injected into the epidural space and the flow of contrast was observed. Radiographs were obtained for documentation purposes.    The injectate was administered into the level noted above.   Additional Comments:  The patient tolerated the procedure well Dressing: 2 x 2 sterile gauze and Band-Aid    Post-procedure details: Patient was observed during the procedure. Post-procedure instructions were reviewed.  Patient left the clinic in stable  condition.

## 2024-03-31 ENCOUNTER — Encounter: Payer: Self-pay | Admitting: Vascular Surgery

## 2024-03-31 ENCOUNTER — Ambulatory Visit: Attending: Vascular Surgery | Admitting: Vascular Surgery

## 2024-03-31 VITALS — BP 120/72 | HR 60 | Temp 98.1°F | Resp 18 | Ht 65.0 in | Wt 176.6 lb

## 2024-03-31 DIAGNOSIS — R29898 Other symptoms and signs involving the musculoskeletal system: Secondary | ICD-10-CM

## 2024-03-31 NOTE — Progress Notes (Signed)
 "   Patient name: Deborah Fisher MRN: [id number] DOB: Jul 26, 1950 Sex: female  REASON FOR CONSULT: F/U after exercise stress test  HPI: Deborah Fisher is a 74 y.o. female, with history of hypertension and non-Hodgkin's lymphoma that presents for follow-up after exercise stress test for evaluation of PAD.   Previously seen with lower extremity fatigue with limited ability to walk or hike ongoing for years.  She had been evaluated by Dr. Darnella with neurosurgery who did not feel that her back pathology would explain her leg fatigue symptoms.  Patient had ABIs on 05/20/2023 that were 1.29 on the right triphasic and 1.25 on the left triphasic and both within normal range.  Past Medical History:  Diagnosis Date   Anginal pain    Bursitis    left hip    Chronic constipation    Coronary artery disease 03/2019   1st and 2nd degree AV   DI (detrusor instability)    Femur fracture (HCC) 2011   Fibroid    Headache    Heart block AV third degree (HCC)    Hypertension 03/05/2022   Most recent measurements are higher than usual.   Menopausal symptoms    Non-Hodgkin lymphoma (HCC) 1992   treated with CHOP   Osteoporosis    h/o 3 stress fractures   Peripheral vascular disease    Syncope and collapse 06/18/2022   Near blackout at end of hike.   Tennis elbow Right    Trigger finger    bilateral     Past Surgical History:  Procedure Laterality Date   Biopsy for Non Hodgkins Lymphoma  03/05/1990   CARPAL TUNNEL RELEASE Right 09/11/2019   Procedure: CARPAL TUNNEL RELEASE;  Surgeon: Murrell Drivers, MD;  Location: [facility];  Service: Orthopedics;  Laterality: Right;   DILATION AND CURETTAGE OF UTERUS     PACEMAKER IMPLANT N/A 09/03/2022   Procedure: PACEMAKER IMPLANT;  Surgeon: Waddell Danelle ORN, MD;  Location: [facility];  Service: Cardiovascular;  Laterality: N/A;   PELVIC LAPAROSCOPY     TOE SURGERY     TRIGGER FINGER RELEASE Left 08/04/2018   Procedure: RELEASE TRIGGER  FINGER/A-1 PULLEY;  Surgeon: Murrell Drivers, MD;  Location: [facility];  Service: Orthopedics;  Laterality: Left;    Family History  Problem Relation Age of Onset   Diabetes Mother 39   Hypertension Mother    Alzheimer's disease Mother    Glaucoma Mother    Heart disease Mother    Hypertension Father    Alcoholism Father        deceased age 5   Cirrhosis Father    Diabetes Brother    Hypertension Brother    Asthma Brother        Childhood   Colon cancer Maternal Uncle    Heart disease Maternal Grandmother    Arrhythmia Maternal Grandmother        No sure.   Heart attack Maternal Grandmother        Passed away at age 15 from heart attack.   Breast cancer Cousin        Mat. 1st cousin-Age 34   Breast cancer Maternal Aunt        Age 78's   Heart disease Maternal Aunt    Other Paternal Grandmother        MI   Arrhythmia Maternal Aunt        No sure.   Heart disease Maternal Aunt  Angina    SOCIAL HISTORY: Social History   Socioeconomic History   Marital status: Single    Spouse name: Not on file   Number of children: 0   Years of education: PhD   Highest education level: Not on file  Occupational History    Employer: [employer]    Comment: Ass. Dean and Professor  Tobacco Use   Smoking status: Never   Smokeless tobacco: Never  Vaping Use   Vaping status: Never Used  Substance and Sexual Activity   Alcohol  use: Yes    Comment: social   Drug use: No   Sexual activity: Not Currently    Partners: Male    Birth control/protection: Post-menopausal  Other Topics Concern   Not on file  Social History Narrative   Not on file   Social Drivers of Health   Tobacco Use: Low Risk (03/31/2024)   Patient History    Smoking Tobacco Use: Never    Smokeless Tobacco Use: Never    Passive Exposure: Not on file  Financial Resource Strain: Not on file  Food Insecurity: Not on file  Transportation Needs: Not on file  Physical Activity: Not on file   Stress: Not on file  Social Connections: Not on file  Intimate Partner Violence: Not on file  Depression (PHQ2-9): Low Risk (12/28/2022)   Depression (PHQ2-9)    PHQ-2 Score: 0  Alcohol  Screen: Not on file  Housing: Not on file  Utilities: Not on file  Health Literacy: Not on file    Allergies[1]  Current Outpatient Medications  Medication Sig Dispense Refill   acetaminophen  (TYLENOL ) 500 MG tablet Take 2 tablets (1,000 mg total) by mouth every 6 (six) hours as needed. (Patient taking differently: Take 1,000 mg by mouth every 6 (six) hours as needed for moderate pain (pain score 4-6).) 30 tablet 0   alendronate (FOSAMAX) 70 MG tablet Take 1 tablet by mouth once a week.     aspirin  81 MG EC tablet TAKE 1 TABLET BY MOUTH EVERY DAY (Patient taking differently: Take 81 mg by mouth daily.) 30 tablet 1   brimonidine (ALPHAGAN) 0.2 % ophthalmic solution Place 1 drop into both eyes 2 (two) times daily.     Calcium Carbonate-Vitamin D (CALCIUM + D PO) Take 1 tablet by mouth 2 (two) times daily.      cetirizine (ZYRTEC) 10 MG tablet Take 10 mg by mouth every evening.     Cholecalciferol (VITAMIN D) 50 MCG (2000 UT) CAPS Take 2,000 Units by mouth in the morning and at bedtime.     Cyanocobalamin (VITAMIN B 12 PO) Take 1 tablet by mouth daily.      Fezolinetant  (VEOZAH ) 45 MG TABS Take 1 tablet (45 mg total) by mouth daily. 90 tablet 0   gabapentin  (NEURONTIN ) 300 MG capsule TAKE 1 PILL IN THE MORNING (300 MG). TAKE 2 PILLS IN THE EVENING (600 MG). 90 capsule 11   latanoprost (XALATAN) 0.005 % ophthalmic solution Place 1 drop into both eyes at bedtime.     Magnesium 200 MG TABS Take 200 mg by mouth at bedtime.     Multiple Vitamin (MULTIVITAMIN) tablet Take 1 tablet by mouth daily.     nitroGLYCERIN  (NITRODUR - DOSED IN MG/24 HR) 0.2 mg/hr patch CUT PATCH INTO FOURTHS. APPLY 1/4 PATCH TO AFFECTED AREA ON HEEL. CHANGE ONCE DAILY. 90 patch 1   nitroGLYCERIN  (NITROSTAT ) 0.4 MG SL tablet Place 1  tablet (0.4 mg total) under the tongue every 5 (five) minutes  as needed for chest pain. 25 tablet 3   rizatriptan (MAXALT-MLT) 10 MG disintegrating tablet Take 10 mg by mouth as needed for migraine.     rosuvastatin (CRESTOR) 10 MG tablet Take 10 mg by mouth daily.     semaglutide-weight management (WEGOVY) 0.25 MG/0.5ML SOAJ SQ injection Inject 0.25 mg into the skin once a week.     tacrolimus (PROTOPIC) 0.1 % ointment Apply 1 Application topically See admin instructions. 1 application for 5 days on and 2 days off     No current facility-administered medications for this visit.    REVIEW OF SYSTEMS:  [X]  denotes positive finding, [ ]  denotes negative finding Cardiac  Comments:  Chest pain or chest pressure:    Shortness of breath upon exertion:    Short of breath when lying flat:    Irregular heart rhythm:        Vascular    Pain in calf, thigh, or hip brought on by ambulation:    Pain in feet at night that wakes you up from your sleep:     Blood clot in your veins:    Leg swelling:         Pulmonary    Oxygen at home:    Productive cough:     Wheezing:         Neurologic    Sudden weakness in arms or legs:     Sudden numbness in arms or legs:     Sudden onset of difficulty speaking or slurred speech:    Temporary loss of vision in one eye:     Problems with dizziness:         Gastrointestinal    Blood in stool:     Vomited blood:         Genitourinary    Burning when urinating:     Blood in urine:        Psychiatric    Major depression:         Hematologic    Bleeding problems:    Problems with blood clotting too easily:        Skin    Rashes or ulcers:        Constitutional    Fever or chills:      PHYSICAL EXAM: Vitals:   03/31/24 1410  BP: 120/72  Pulse: 60  Resp: 18  Temp: 98.1 F (36.7 C)  TempSrc: Temporal  SpO2: 98%  Weight: 176 lb 9.6 oz (80.1 kg)  Height: 5' 5 (1.651 m)    GENERAL: The patient is a well-nourished female, in no acute  distress. The vital signs are documented above. CARDIAC: There is a regular rate and rhythm.  VASCULAR:  Bilateral femoral pulses palpable Bilateral DP pulses palpable PULMONARY: No respiratory distress. SKIN: There are no ulcers or rashes noted. PSYCHIATRIC: The patient has a normal affect.  DATA:   Lower extremity exercise 03/24/2024 were essentially normal  ABIs 02/18/24 are 1.19 on the right triphasic and 1.15 on the left triphasic  Assessment/Plan:   74 y.o. female, with history of hypertension and non-Hodgkin's lymphoma that presents for follow-up after exercise stress test for evaluation of PAD.   Previously seen with lower extremity fatigue with limited ability to walk or hike ongoing for years.  She had been evaluated by Dr. Darnella with neurosurgery who did not feel that her back pathology would explain her leg fatigue symptoms.  Discussed that her lower extremity exercise stress test was normal.  No evidence of  significant peripheral arterial disease.  She has a normal exam with palpable dorsalis pedis pulses bilaterally.  She can follow-up with me as needed.  I offered reassurance today.    Lonni DOROTHA Gaskins, MD [facility] Office: [phone]        [1]  Allergies Allergen Reactions   Covid-19 (Mrna) Vaccine Anaphylaxis   Iodinated Contrast Media Other (See Comments)     Warm feeling once   Tape Rash   "

## 2024-04-05 ENCOUNTER — Telehealth: Payer: Self-pay

## 2024-04-05 ENCOUNTER — Encounter: Payer: Self-pay | Admitting: Orthopedic Surgery

## 2024-04-05 ENCOUNTER — Encounter: Payer: Self-pay | Admitting: Physical Medicine and Rehabilitation

## 2024-04-05 DIAGNOSIS — M48062 Spinal stenosis, lumbar region with neurogenic claudication: Secondary | ICD-10-CM

## 2024-04-05 DIAGNOSIS — M5416 Radiculopathy, lumbar region: Secondary | ICD-10-CM

## 2024-04-05 NOTE — Telephone Encounter (Signed)
 MYCHART SENT ABOUT R/S APPT

## 2024-04-06 ENCOUNTER — Ambulatory Visit: Admitting: Orthopedic Surgery

## 2024-04-06 NOTE — Addendum Note (Signed)
 Addended by: ELDONNA GARDINER POUR on: 04/06/2024 06:40 PM   Modules accepted: Orders

## 2024-04-16 ENCOUNTER — Ambulatory Visit: Admitting: Orthopedic Surgery

## 2024-04-23 ENCOUNTER — Encounter: Admitting: Physical Medicine and Rehabilitation

## 2024-05-21 ENCOUNTER — Ambulatory Visit: Admitting: Orthopedic Surgery

## 2024-05-26 ENCOUNTER — Other Ambulatory Visit (HOSPITAL_BASED_OUTPATIENT_CLINIC_OR_DEPARTMENT_OTHER): Payer: Self-pay

## 2025-01-04 ENCOUNTER — Ambulatory Visit (HOSPITAL_BASED_OUTPATIENT_CLINIC_OR_DEPARTMENT_OTHER): Payer: Self-pay | Admitting: Obstetrics & Gynecology
